# Patient Record
Sex: Male | Born: 1950
Health system: Southern US, Community
[De-identification: ages and names within clinical notes are randomized; demographics above are authoritative.]

## PROBLEM LIST (undated history)

## (undated) DIAGNOSIS — I10 Essential (primary) hypertension: Secondary | ICD-10-CM

## (undated) DIAGNOSIS — I2510 Atherosclerotic heart disease of native coronary artery without angina pectoris: Secondary | ICD-10-CM

## (undated) DIAGNOSIS — Z87891 Personal history of nicotine dependence: Secondary | ICD-10-CM

## (undated) DIAGNOSIS — E785 Hyperlipidemia, unspecified: Secondary | ICD-10-CM

## (undated) DIAGNOSIS — I739 Peripheral vascular disease, unspecified: Secondary | ICD-10-CM

## (undated) HISTORY — DX: Peripheral vascular disease, unspecified: I73.9

## (undated) HISTORY — DX: Essential (primary) hypertension: I10

## (undated) HISTORY — DX: Personal history of nicotine dependence: Z87.891

## (undated) HISTORY — DX: Atherosclerotic heart disease of native coronary artery without angina pectoris: I25.10

## (undated) HISTORY — DX: Hyperlipidemia, unspecified: E78.5

---

## 2004-08-15 HISTORY — PX: CORONARY ARTERY BYPASS GRAFT: SHX141

## 2005-02-20 ENCOUNTER — Emergency Department: Payer: Self-pay | Admitting: Emergency Medicine

## 2005-02-28 ENCOUNTER — Emergency Department: Payer: Self-pay | Admitting: Emergency Medicine

## 2005-02-28 ENCOUNTER — Other Ambulatory Visit: Payer: Self-pay

## 2005-03-04 ENCOUNTER — Ambulatory Visit: Payer: Self-pay | Admitting: Physician Assistant

## 2005-12-12 ENCOUNTER — Emergency Department: Payer: Self-pay | Admitting: Unknown Physician Specialty

## 2005-12-12 ENCOUNTER — Other Ambulatory Visit: Payer: Self-pay

## 2005-12-19 ENCOUNTER — Ambulatory Visit: Payer: Self-pay | Admitting: Cardiovascular Disease

## 2005-12-19 ENCOUNTER — Inpatient Hospital Stay (HOSPITAL_COMMUNITY): Admission: EM | Admit: 2005-12-19 | Discharge: 2005-12-24 | Payer: Self-pay | Admitting: Surgery

## 2005-12-20 ENCOUNTER — Encounter: Payer: Self-pay | Admitting: Vascular Surgery

## 2006-01-17 ENCOUNTER — Encounter: Payer: Self-pay | Admitting: Cardiovascular Disease

## 2006-01-25 ENCOUNTER — Encounter: Admission: RE | Admit: 2006-01-25 | Discharge: 2006-01-25 | Payer: Self-pay | Admitting: Surgery

## 2006-02-12 ENCOUNTER — Encounter: Payer: Self-pay | Admitting: Cardiovascular Disease

## 2006-03-15 ENCOUNTER — Encounter: Payer: Self-pay | Admitting: Cardiovascular Disease

## 2006-04-15 ENCOUNTER — Encounter: Payer: Self-pay | Admitting: Cardiovascular Disease

## 2006-05-26 ENCOUNTER — Other Ambulatory Visit: Payer: Self-pay

## 2006-05-31 ENCOUNTER — Inpatient Hospital Stay: Payer: Self-pay | Admitting: Surgery

## 2007-03-02 ENCOUNTER — Ambulatory Visit: Payer: Self-pay | Admitting: Cardiovascular Disease

## 2007-04-03 ENCOUNTER — Ambulatory Visit: Payer: Self-pay | Admitting: Vascular Surgery

## 2007-05-03 ENCOUNTER — Ambulatory Visit: Payer: Self-pay | Admitting: Vascular Surgery

## 2007-10-03 ENCOUNTER — Ambulatory Visit: Payer: Self-pay | Admitting: Vascular Surgery

## 2007-10-10 ENCOUNTER — Inpatient Hospital Stay: Payer: Self-pay | Admitting: Vascular Surgery

## 2009-01-10 ENCOUNTER — Inpatient Hospital Stay: Payer: Self-pay | Admitting: Internal Medicine

## 2009-01-13 ENCOUNTER — Ambulatory Visit: Payer: Self-pay | Admitting: Cardiovascular Disease

## 2009-03-05 IMAGING — XA IR VASCULAR PROCEDURE
15 of 24 series · 15 of 24 positions shown · non-contrast
Comparison: none

[Series 1: run · 1 of 35 slices shown (1 of 15)]
[im 1/35]
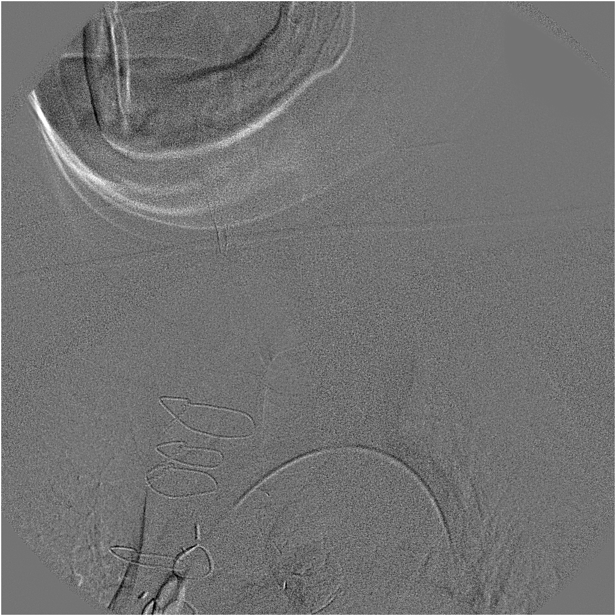

[Series 2: run · 1 of 26 slices shown (2 of 15)]
[im 1/26]
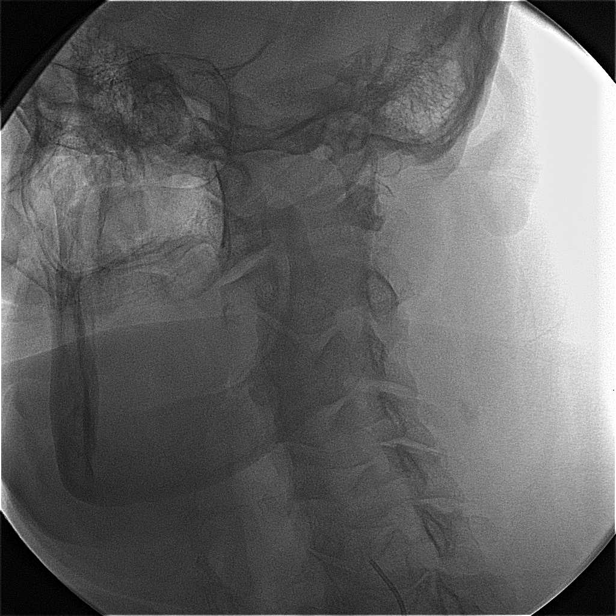

[Series 4: run · 1 of 16 slices shown (3 of 15)]
[im 1/16]
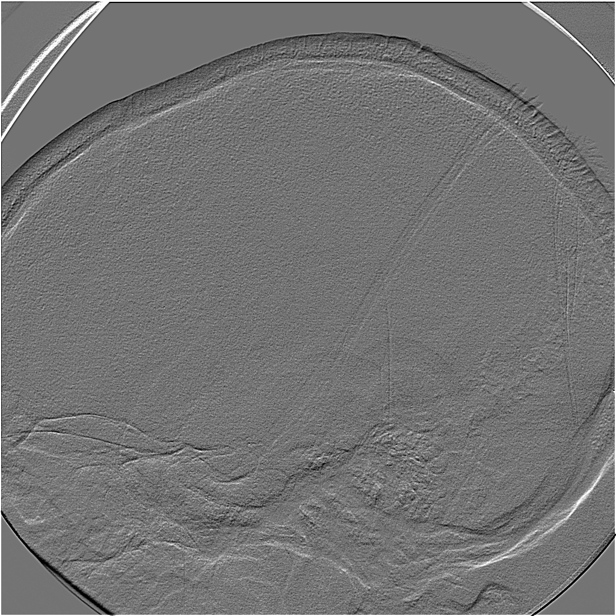

[Series 5: run · 1 of 18 slices shown (4 of 15)]
[im 1/18]
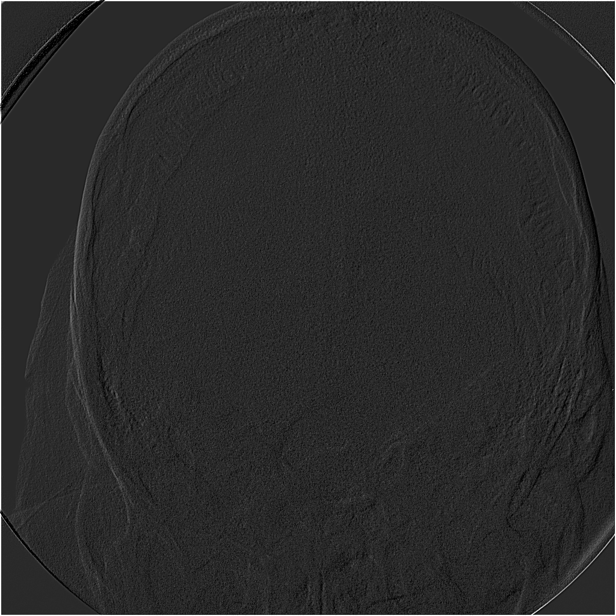

[Series 7: run · 1 of 15 slices shown (5 of 15)]
[im 1/15]
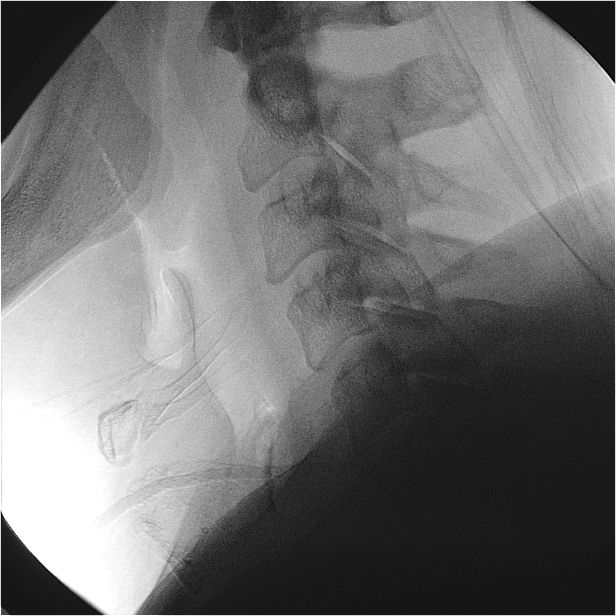

[Series 8: run · 1 of 13 slices shown (6 of 15)]
[im 1/13]
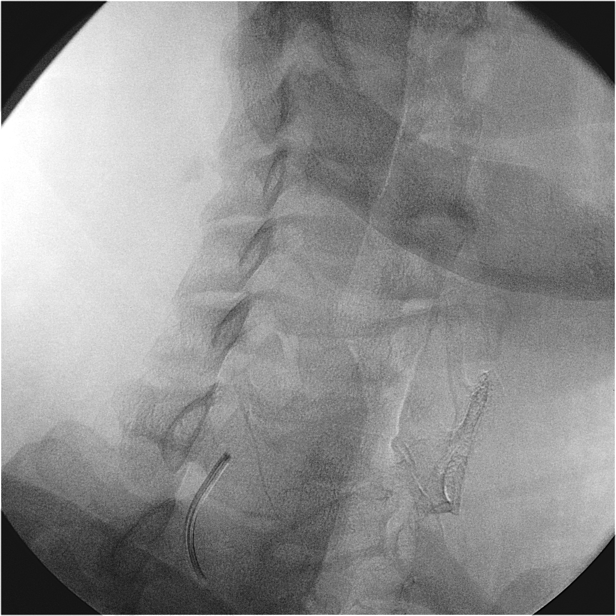

[Series 10: run · 1 of 15 slices shown (7 of 15)]
[im 1/15]
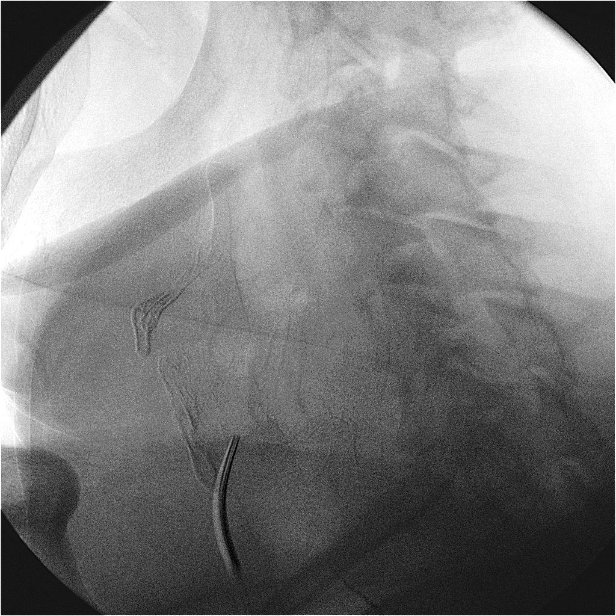

[Series 12: run · 1 of 16 slices shown (8 of 15)]
[im 1/16]
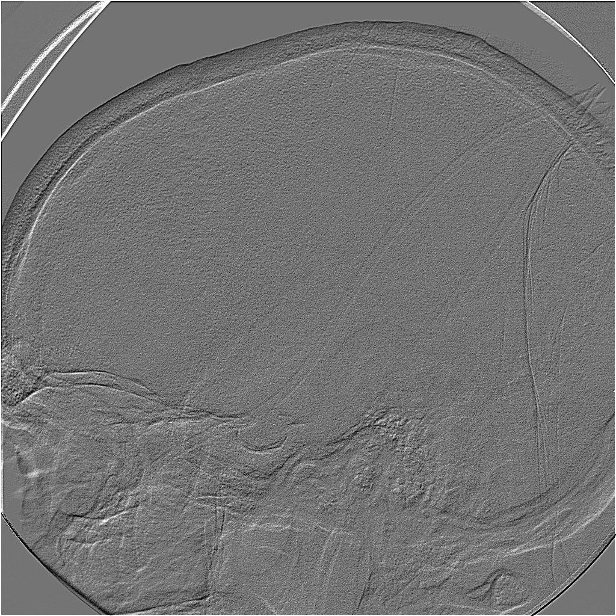

[Series 13: run · 1 of 18 slices shown (9 of 15)]
[im 1/18]
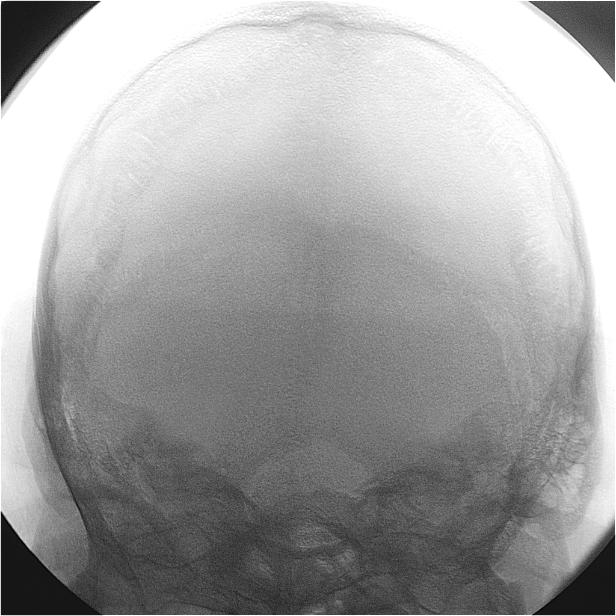

[Series 15: run · 1 of 11 slices shown (10 of 15)]
[im 1/11]
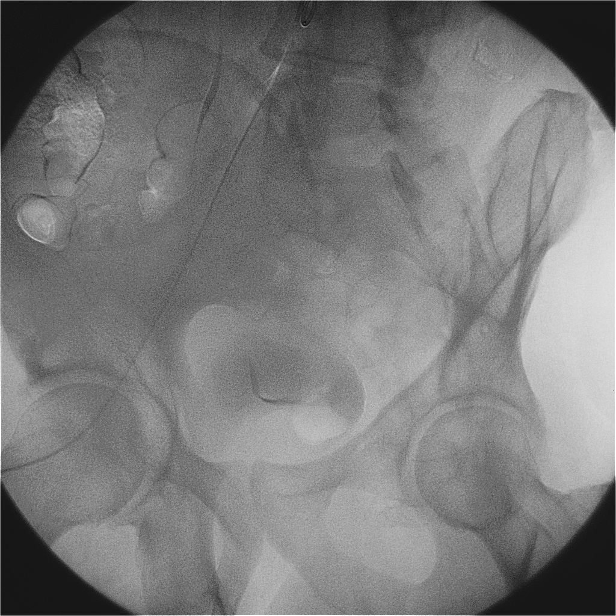

[Series 16: run · 1 of 31 slices shown (11 of 15)]
[im 1/31]
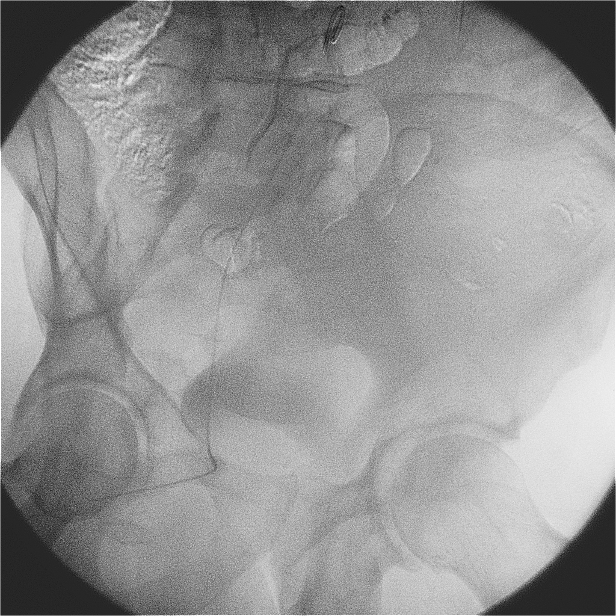

[Series 18: run · 1 of 14 slices shown (12 of 15)]
[im 1/14]
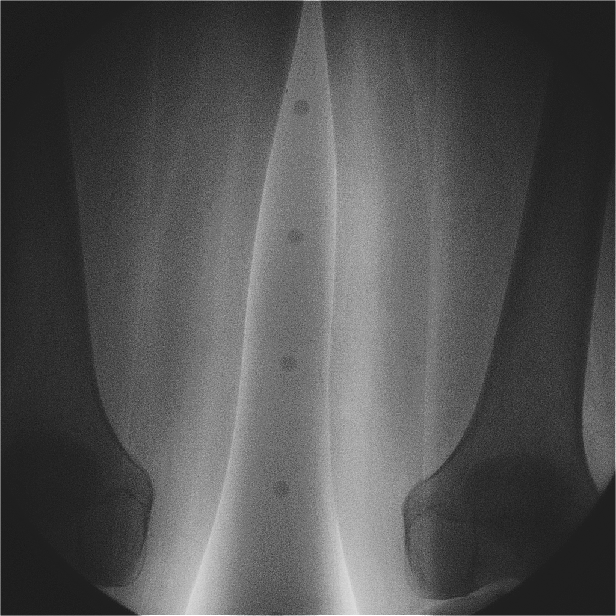

[Series 20: run · 1 of 33 slices shown (13 of 15)]
[im 1/33]
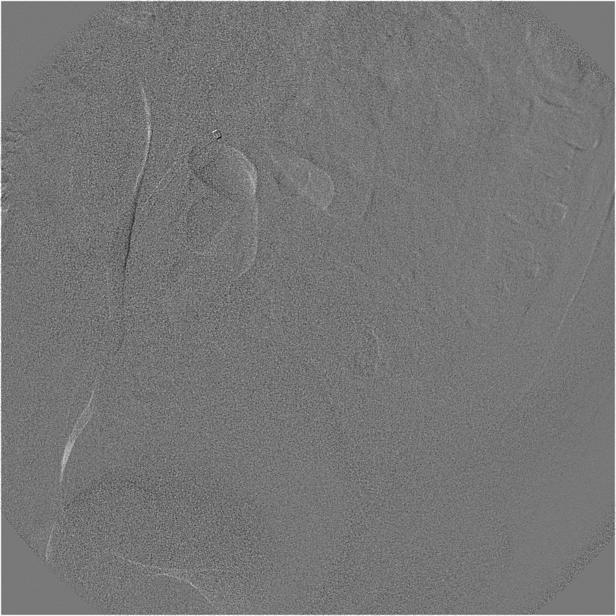

[Series 21: run · 1 of 13 slices shown (14 of 15)]
[im 1/13]
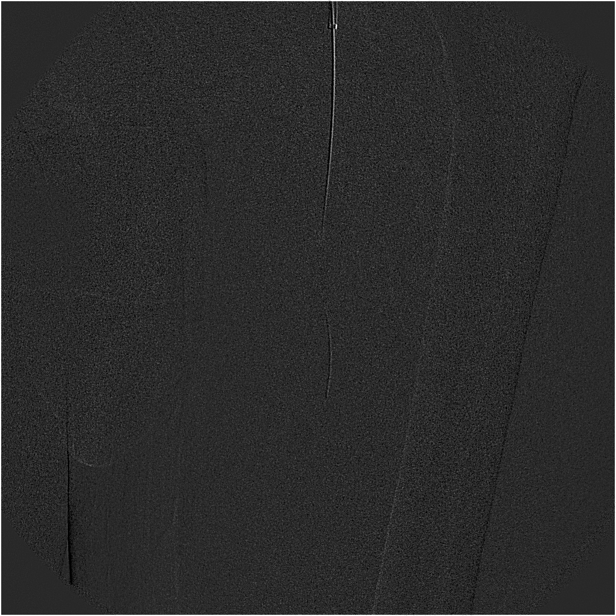

[Series 23: run · 1 of 16 slices shown (15 of 15)]
[im 1/16]
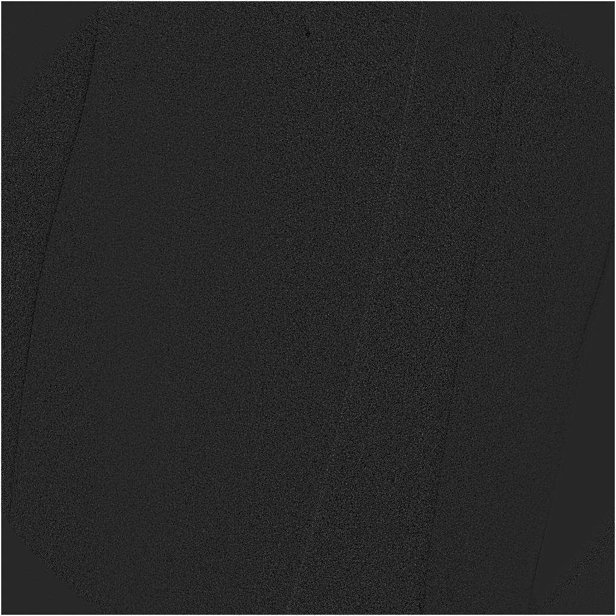

[15 of 24 positions shown; findings below may reference images not displayed]

IMAGES IMPORTED FROM THE [employer] WORKFLOW SYSTEM
NO DICTATION FOR STUDY

## 2009-05-12 ENCOUNTER — Ambulatory Visit: Payer: Self-pay | Admitting: Vascular Surgery

## 2009-12-08 ENCOUNTER — Inpatient Hospital Stay: Payer: Self-pay | Admitting: Internal Medicine

## 2009-12-14 ENCOUNTER — Ambulatory Visit: Payer: Self-pay | Admitting: Unknown Physician Specialty

## 2010-09-05 ENCOUNTER — Encounter: Payer: Self-pay | Admitting: Surgery

## 2010-09-18 ENCOUNTER — Emergency Department: Payer: Self-pay | Admitting: Emergency Medicine

## 2010-09-27 ENCOUNTER — Ambulatory Visit: Payer: Self-pay | Admitting: Internal Medicine

## 2010-10-20 ENCOUNTER — Other Ambulatory Visit: Payer: Self-pay | Admitting: Gastroenterology

## 2010-10-28 ENCOUNTER — Ambulatory Visit: Payer: Self-pay | Admitting: Surgery

## 2010-11-03 LAB — PATHOLOGY REPORT
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
## 2010-12-31 NOTE — Op Note (Signed)
NAMEMarland Hernandez  EGON, DITTUS NO.:  [id number]   MEDICAL RECORD NO.:  [id number]          PATIENT TYPE:  INP   LOCATION:  2301                         FACILITY:  [facility]   PHYSICIAN:  Evelene Croon, M.D.     DATE OF BIRTH:  1950-10-26   DATE OF PROCEDURE:  12/20/2005  DATE OF DISCHARGE:                                 OPERATIVE REPORT   PREOPERATIVE DIAGNOSIS:  Severe three-vessel coronary artery disease.   POSTOPERATIVE DIAGNOSIS:  Severe three-vessel coronary artery disease.   OPERATIVE PROCEDURE:  1.  Median sternotomy.  2.  Extracorporeal circulation.  3.  Coronary artery bypass graft surgery x4 using a left internal mammary      artery graft to the left anterior descending coronary artery, with a      saphenous vein graft to the diagonal branch of the left anterior      descending, a saphenous vein graft to the obtuse marginal branch of the      left circumflex coronary artery, and a saphenous vein graft to the      posterior descending branch of the right coronary artery.  4.  Endoscopic vein harvesting from the right leg.   ATTENDING SURGEON:  Evelene Croon, M.D.   ASSISTANT:  Rowe Clack, P.A.-C.   ANESTHESIA:  General endotracheal.   CLINICAL HISTORY:  This patient is a 60 year old gentleman with a history of  smoking, hypertension, and hypercholesterolemia, as well as a positive  family history of heart disease, who was transferred here by Dr. Welton Flakes from  [facility] for coronary artery bypass surgery.  He  presented with a several-week history of episodic dizziness and generalized  weakness.  He underwent a stress test which suggested anterior ischemia.  Cardiac catheterization yesterday at [facility] showed  severe three-vessel disease.  There was 90% proximal LAD stenosis and 70%  diagonal stenosis.  There is about 70% mid left circumflex stenosis with a  large marginal branch.  There is 90% proximal right  coronary artery stenosis  with some thrombus present.  Left ventricular ejection fraction was normal.  After review of the angiogram and examination of the patient, it was felt  that coronary artery bypass graft surgery was the best treatment to prevent  further ischemia and infarction.  I discussed the operative procedure with  the patient and his wife including alternatives, benefits, and risks  including bleeding, infection, stroke, myocardial infarction, graft failure,  and death.  The patient did inform me that his wife was a TEFL teacher Witness.  Although he was not formally a Jehovah's Witness, he still wished to honor  her religion and therefore did not want any blood product transfusion during  the procedure.  His preoperative hemoglobin was greater than 14 and  therefore I felt it would be safe to proceed with coronary artery bypass  surgery.   OPERATIVE PROCEDURE:  The patient was taken to the operating room and placed  on the table in a supine position.  After induction of general endotracheal  anesthesia, a Foley catheter was placed in  the bladder using sterile  technique.  Then the chest, abdomen and both lower extremities were prepped  and draped in the usual sterile manner.  The chest was entered through a  median sternotomy incision and the pericardium opened in the midline.  Examination of the heart showed good ventricular contractility.  The  ascending aorta had no palpable plaques in it.   Then the left internal mammary artery was harvested from the chest wall as a  pedicle graft.  This was a medium-caliber vessel with excellent blood flow  through it.  At the same time, a segment of greater saphenous vein was  harvested from the right leg using endoscopic vein harvest technique.  This  vein was of medium size and good quality.   Then the patient was heparinized and when an adequate activated clotting  time was achieved, the distal ascending aorta was cannulated using  a 20-  [place] aortic cannula for arterial inflow.  Venous outflow was achieved  using a 2-stage venous cannula for the right atrial appendage.  An antegrade  cardioplegia and vent cannula was inserted in the aortic root.   The patient was placed on cardiopulmonary bypass and the distal coronaries  were identified.  The LAD was a large graftable vessel.  It did have diffuse  posterior plaque present.  The posterior plaque extended out into the apical  portion of the vessel.  The diagonal branch was a medium-sized graftable  vessel.  The obtuse marginal was intramyocardial in its proximal portion and  exited through the surface of the heart distally, where it was a large  graftable vessel.  There was no visible disease distally.  The distal left  circumflex terminated as a small marginal branch.  The right coronary artery  was diffusely diseased with calcific plaque and this extended out to the  takeoff of the posterior descending branch.  The posterior descending branch  itself was a medium-sized vessel that had no distal disease in it.  The  cardiac catheterization had shown this 90% stenosis in the proximal-to-mid  right coronary artery with some thrombus present.  There was also some  stenosis present in the proximal portion of the posterior descending branch.   Then the aorta was cross-clamped and 1000 mL of cold blood antegrade  cardioplegia was administered in the aortic root with quick arrest of the  heart.  Systemic hypothermia to 28 degrees centigrade and topical  hypothermia with iced saline was used.  A temperature probe was placed in  the septum and an insulating pad in the pericardium.   The first distal anastomosis was performed to the obtuse marginal branch.  The internal diameter was 1.75 mm.  The conduit that was used was a segment  of greater saphenous vein and the anastomosis performed in an end-to-side manner using continuous 7-0 Prolene suture.  Flow was measured  through the  graft and was excellent.   The second distal anastomosis was performed to the posterior descending  coronary artery.  The internal diameter was about 1.75 mm.  The conduit that  was used was a second segment of greater saphenous vein and the anastomosis  performed in an end-to-side manner using continuous 7-0 Prolene suture.  Flow was noted through the graft and was excellent.   The third distal anastomosis was performed to the diagonal branch.  The  internal diameter of this vessel was about 1.6 mm.  The conduit that was  used was a third segment of greater saphenous vein  and the anastomosis  performed in an end-to-side manner using continuous 7-0 Prolene suture.  Flow was measured through the graft and was excellent.  Then another dose of  cardioplegia was given down the vein grafts and the aortic root.   The fourth distal anastomosis was performed to the distal LAD.  The internal  diameter was about 2 mm.  The conduit that was used was the left internal  mammary graft and this was brought through an opening in the left  pericardium anterior to the phrenic nerve.  It was anastomosed to the LAD in  an end-to-side manner using continuous 8-0 Prolene suture.  The pedicle was  sutured to the epicardium with 6-0 Prolene sutures.  The patient was  rewarmed to 37 degrees centigrade.  With the cross-clamp in place, the 3  proximal vein graft anastomoses were performed to the aortic root in an end-  to-side manner using continuous 6-0 Prolene suture.  Then the clamp was  removed from the mammary artery pedicle.  There was rapid rewarming of the  ventricular septum and return of spontaneous ventricular fibrillation.  The  cross-clamp was removed with a time of 77 minutes and the patient  spontaneously converted to sinus rhythm.   The proximal and distal anastomoses appeared hemostatic and the lines of the  graft satisfactory.  A graft marker was placed around the proximal   anastomoses.  Two temporary right ventricular and right atrial pacing wires  were placed and brought out through the skin.   When the patient had rewarmed to 37 degrees centigrade, he was weaned from  cardiopulmonary bypass on no inotropic agents.  Total bypass time was 91  minutes.  Cardiac function appeared excellent with a cardiac output of 6  L/min.  Protamine was given and the venous and aortic cannulas were removed  without difficulty.  Hemostasis was achieved.  Three chest tubes were placed  with a tube in the posterior pericardium, 1 in the left pleural space and 1  in the anterior mediastinum.  The pericardium was loosely reapproximated  over the heart.  The sternum was closed with #6 stainless steel wires.  The  fascia was closed with a continuous #1 Vicryl.  The subcutaneous tissue was  closed with a continuous 2-0 Vicryl and the skin with a 3-0 Vicryl subcuticular closure.  The lower extremity vein harvest site was closed in  layers in a similar manner.  The sponge, needle and instrument counts were  correct according to the scrub nurse.  Dry sterile dressings were applied  over the incisions and around the chest tubes, which were hooked to Pleur-  evac suction.  The patient remained hemodynamically stable and was  transported to the SICU in guarded, but stable condition.      Evelene Croon, M.D.  Electronically Signed     BB/MEDQ  D:  12/20/2005  T:  12/21/2005  Job:  [id number]   cc:   Nicholes Rough, [place] Adrian Blackwater MD

## 2010-12-31 NOTE — Discharge Summary (Signed)
NAMEMarland Kitchen  Johnathan Hernandez, Johnathan Hernandez NO.:  [id number]   MEDICAL RECORD NO.:  [id number]          PATIENT TYPE:  INP   LOCATION:  2031                         FACILITY:  [facility]   PHYSICIAN:  Evelene Croon, M.D.     DATE OF BIRTH:  1951-07-18   DATE OF ADMISSION:  12/19/2005  DATE OF DISCHARGE:                                 DISCHARGE SUMMARY   PRIMARY DIAGNOSIS:  Severe three vessel coronary artery disease.   SECONDARY DIAGNOSES:  1.  Hyperlipidemia.  2.  Hypertension.   ALLERGIES:  Allergic to PERCOCET and VICODIN.   OPERATIONS AND PROCEDURES:  1.  Coronary artery bypass grafting x4 using a left internal mammary artery      graft to the left anterior descending coronary artery, saphenous vein      graft to diagonal branch of the left anterior descending, saphenous vein      graft to obtuse marginal branch of the left circumflex coronary artery,      saphenous vein graft to the posterior descending branch of the RCA.      Endoscopic vein harvesting from the right leg was done.   HOSPITAL COURSE:  The patient is a 60 year old gentleman with a history of  tobacco use, hypertension, hyperlipidemia as well as positive family history  of heart disease.  The patient was transferred to [facility] by Dr. Welton Flakes  from [facility] for coronary artery bypass grafting.  The patient presented with a several week history of episodic dizziness and  generalized weakness.  He underwent stress test which suggested the anterior  anemia.  Cardiac catheterization was then performed [place] 7, 2007, which  showed a 90% proximal LAD stenosis and 70% diagonal stenosis.  There is  about 70% mid left circumflex of the large marginal branch.  There is 90%  proximal RCA stenosis with some thrombus present.  Left ventricular ejection  fraction was normal.  Films were evaluated and was reviewed by Dr. Laneta Simmers.  Dr. Laneta Simmers discussed with the patient undergoing coronary bypass grafting.  He  discussed risks and benefits of the procedure.  The patient acknowledged  understanding and agreed to proceed.  The surgery was scheduled for [place] 8,  2007.  Prior to undergoing the surgery, the patient underwent preoperative  bilateral carotid duplex ultrasound which showed the right to have no ICA  stenosis and the left to have 60-80% ICA stenosis.   The patient was taken to the operating room and [place] 8, 2007, where he  underwent coronary artery bypass grafting x4 using a left internal mammary  artery graft to the left anterior descending coronary artery, saphenous vein  graft to the diagonal branch of the left anterior descending, saphenous vein  graft to the obtuse marginal branch of the left circumflex, saphenous vein  graft to posterior descending branch of the RCA.  Endoscopic vein harvesting  from the right leg was done.  The patient tolerated this procedure well and  was transferred up to the intensive care unit in stable condition.  Immediately following surgery, the patient was seen to be  hemodynamically  stable.  The patient was extubated in the early morning following surgery.  Following extubation, the patient was seen to be alert and oriented x 3  neurologically intact.  The patient's postoperative course was pretty much  unremarkable.  Postop day one, chest tubes and lines were discontinued in  the normal fashion.  He was seen to remain hemodynamically stable with a  hemoglobin and hematocrit at 12.1 and 35.5.  Creatinine was stable at 0.8.  The patient was out of bed ambulating well.  He was transferred out to 2000  on postop day one.  The next day, the patient did have complaints of left  shoulder and upper arm pain as well as right arm pain.  He noted numbness in  the left ulnar nerve distribution with pain and weakness in his left arm.  Questionable brachial plexus injury versus cervical nerve root.  This was  monitored during his hospital stay.  This did improve slightly  prior to  discharge.  The patient's vitals were monitored postoperatively and were  seen to be stable.  Medications were adjusted.  The patient was able to be  weaned off oxygen sating greater than 90% on room air.  The patient remained  in normal sinus rhythm postoperatively.  Chest x-rays were stable.  The  incisions were dry and intact and healing well.  The patient was out of bed  ambulating well.  He was tolerating a regular diet well with no nausea,  vomiting noted.  Bowel movements within normal limits.   The patient is tentatively ready for discharge home over the next 1-2 days  if he remains stable.  A follow-up appointment will be scheduled with Dr.  Laneta Simmers for in three weeks.  The patient will need to obtain a PA and lateral  chest x-ray one hour prior to this appointment.  The patient will need to  contact Dr. Milta Deiters office in [place] to schedule a follow-up appointment  with him in two weeks.  Johnathan Hernandez received instructions on diet, activity  level, and incisional care.  He was told no driving until released to do so,  no heavy lifting over 10 pounds.  The patient is told he is allowed to  shower washing his incisions using soap and water.  He is to contact the  office if he develops any drainage or opening from any of his incision  sites.  The patient is to ambulate 3-4 times per day and to progress as  tolerated and to continue his breathing exercises.  The patient was educated  on diet to be low-fat, low-salt.  He acknowledged understanding.   DISCHARGE MEDICATIONS:  1.  Aspirin 325 mg daily.  2.  Lopressor 25 mg b.i.d.  3.  Lipitor 40 mg at night.  4.  Prilosec 20 mg daily.  5.  Ultram 50 mg 1-2 tablets q.4-6h. p.r.n. pain.      Johnathan Hernandez, [place]      Evelene Croon, M.D.  Electronically Signed    KMD/MEDQ  D:  12/23/2005  T:  12/24/2005  Job:  [id number]

## 2011-05-20 ENCOUNTER — Ambulatory Visit: Payer: Self-pay | Admitting: Unknown Physician Specialty

## 2011-05-20 DIAGNOSIS — I10 Essential (primary) hypertension: Secondary | ICD-10-CM

## 2011-05-26 ENCOUNTER — Inpatient Hospital Stay: Payer: Self-pay | Admitting: Unknown Physician Specialty

## 2011-07-16 DIAGNOSIS — I739 Peripheral vascular disease, unspecified: Secondary | ICD-10-CM

## 2011-07-16 HISTORY — DX: Peripheral vascular disease, unspecified: I73.9

## 2011-07-16 HISTORY — PX: AORTA - ILIAC ARTERY BYPASS GRAFT: SUR174

## 2011-09-26 ENCOUNTER — Telehealth: Payer: Self-pay | Admitting: Internal Medicine

## 2011-09-26 NOTE — Telephone Encounter (Signed)
[phone] Pt wife came in and stated that he thinks he has a uti.  He is unable to control voiding. Has fever, nausated back pain. Pt has appointment Wednesday with Dr Bernette Redbird @ [facility]  Has new pt appointment 11/14/11.  Pt wife stated he does not have primary care dr and wanted to know if he could be seen sooner

## 2011-09-26 NOTE — Telephone Encounter (Signed)
See below note Appointment has to be after 11 Pt has another appointment on wed 2/13 @ 10:45  \ Can not come at all on Friday 2/15, Or the  2/20,2/25,2/28 unless in afternoon

## 2011-09-27 NOTE — Telephone Encounter (Signed)
Patient advised that we have no sooner openings especially for times that he has requested. He was notified to see an urgent care for any acute problems that he Maker have until he is established with [place]. Patient agreed.

## 2011-11-14 ENCOUNTER — Ambulatory Visit (INDEPENDENT_AMBULATORY_CARE_PROVIDER_SITE_OTHER): Payer: PRIVATE HEALTH INSURANCE | Admitting: Internal Medicine

## 2011-11-14 ENCOUNTER — Encounter: Payer: Self-pay | Admitting: Internal Medicine

## 2011-11-14 VITALS — BP 132/66 | HR 80 | Temp 97.9°F | Resp 16 | Ht 71.0 in | Wt 207.5 lb

## 2011-11-14 DIAGNOSIS — I798 Other disorders of arteries, arterioles and capillaries in diseases classified elsewhere: Secondary | ICD-10-CM

## 2011-11-14 DIAGNOSIS — I70209 Unspecified atherosclerosis of native arteries of extremities, unspecified extremity: Secondary | ICD-10-CM

## 2011-11-14 DIAGNOSIS — E119 Type 2 diabetes mellitus without complications: Secondary | ICD-10-CM

## 2011-11-14 DIAGNOSIS — Z87891 Personal history of nicotine dependence: Secondary | ICD-10-CM | POA: Insufficient documentation

## 2011-11-14 DIAGNOSIS — E785 Hyperlipidemia, unspecified: Secondary | ICD-10-CM

## 2011-11-14 DIAGNOSIS — Z79899 Other long term (current) drug therapy: Secondary | ICD-10-CM

## 2011-11-14 DIAGNOSIS — I2510 Atherosclerotic heart disease of native coronary artery without angina pectoris: Secondary | ICD-10-CM

## 2011-11-14 DIAGNOSIS — I739 Peripheral vascular disease, unspecified: Secondary | ICD-10-CM | POA: Insufficient documentation

## 2011-11-14 DIAGNOSIS — Z125 Encounter for screening for malignant neoplasm of prostate: Secondary | ICD-10-CM

## 2011-11-14 DIAGNOSIS — E1159 Type 2 diabetes mellitus with other circulatory complications: Secondary | ICD-10-CM

## 2011-11-14 MED ORDER — METFORMIN HCL ER 500 MG PO TB24: 500.0000 mg | ORAL_TABLET | Freq: Every day | ORAL | Status: DC

## 2011-11-14 MED ORDER — METOPROLOL SUCCINATE ER 100 MG PO TB24: 100.0000 mg | ORAL_TABLET | Freq: Every day | ORAL | Status: DC

## 2011-11-14 MED ORDER — PRAVASTATIN SODIUM 10 MG PO TABS: 10.0000 mg | ORAL_TABLET | Freq: Every day | ORAL | Status: DC

## 2011-11-14 NOTE — Patient Instructions (Signed)
Stop the nortriptyline and increase the tramadol to 2 at bedtime as a trila for your nighttime pain .   Labs today to check on diabetes, cholesterol and prostate cancer screening.   Return in 3 months for your annual physical.

## 2011-11-14 NOTE — Assessment & Plan Note (Addendum)
hgba1c is 7.5 without proteinuria and nwith normal renal function.  Increasing metformin to 1000 mg daily

## 2011-11-14 NOTE — Assessment & Plan Note (Signed)
Asymptomatic currently and historically. Continue current medications.

## 2011-11-14 NOTE — Progress Notes (Signed)
Patient ID: Johnathan Hernandez, male   DOB: Mar 05, 1951, 61 y.o.   MRN: [id number]  Patient Active Problem List  Diagnoses  . Peripheral vascular disease  . History of tobacco abuse  . Coronary artery disease  . 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 New Patient    HPI:   Johnathan Hernandez a 61 y.o. male who presents Who is tansferring from Dr. Alison Murray.  He as last seen 2 months ago but did not have labs doe for follow up on diabetes mellitus and hyperlipidemia.     Cc left foot pain , persistent not responding to epidural injections done recently at [facility].   ,  Lyrica started which helped but not for the last couple of weeks.  Has history of hallucinations and apnea with vicodin and percocet, respectively.,  Oxycontin helped but it was stopped when the shots were started. He has a history of lumbar surgery Oct 2012 for Sciatica has not helped and surgeon Dr. Raford Pitcher has now released him.  He is being referred by [facility] Comp amy Finiss, caseworker,  For a second opinion by a neurosurgeon Next Monday.       Past Medical History  Diagnosis Date  . Diabetes mellitus   . Hypertension   . Hyperlipidemia   . Peripheral vascular disease Dec 2012    [facility], treated with extensive arterial bypass  . History of tobacco abuse     quit in 2006 after 5 vessel cabg  . Coronary artery disease     Past Surgical History  Procedure Date  . Coronary artery bypass graft 2006    at [facility]         The following portions of the patient's history were reviewed and updated as appropriate: Allergies, current medications, and problem list.    Review of Systems:   12 Pt  review of systems was negative except those addressed in the HPI,     History   Social History  . Marital Status: Married    Spouse Name: N/A    Number of Children: N/A  . Years of Education: N/A   Occupational History  .  Not on file.   Social History Main Topics  . Smoking status: Former Smoker    Quit date: 11/13/2004  . Smokeless tobacco: Never Used  . Alcohol Use: No  . Drug Use: No  . Sexually Active: Not on file   Other Topics Concern  . Not on file   Social History Narrative  . No narrative on file    Objective:  BP 132/66  Pulse 80  Temp(Src) 97.9 F (36.6 C) (Oral)  Resp 16  Ht 5\' 11"  (1.803 m)  Wt 207 lb 8 oz (94.121 kg)  BMI 28.94 kg/m2  SpO2 96%  General appearance: alert, cooperative and appears stated age Ears: normal TM's and external ear canals both ears Throat: lips, mucosa, and tongue normal; teeth and gums normal Neck: no adenopathy, no carotid bruit, supple, symmetrical, trachea midline and thyroid not enlarged, symmetric, no tenderness/mass/nodules Back: symmetric, no curvature. ROM normal. No CVA tenderness. Lungs: clear to auscultation bilaterally Heart: regular rate and rhythm, S1, S2 normal, no murmur, click, rub or gallop Abdomen: soft, non-tender; bowel sounds normal; no masses,  no organomegaly Pulses: 2+ and symmetric Skin: Skin color, texture, turgor normal. No rashes or lesions Lymph nodes: Cervical, supraclavicular, and axillary nodes normal.  Assessment and Plan:  Peripheral vascular disease S/p aortoiliac bypass done at [facility] several years ago, with restoration of circulation but  Persistent toe pain.  His foot exam today shows well perfused toes. 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  He takes a statin,  LDl goal is 70.  Records requested and repeat labs due to assess control and enal function.  Diabetic shoes requested for management of chronic foot pain, which is worth a try.  Reminder for annual eye exam given.   Coronary artery disease Asymptomatic currently and historically. Continue current medications.     Updated Medication List Outpatient  Encounter Prescriptions as of 11/14/2011  Medication Sig Dispense Refill  . aspirin 81 MG tablet Take 81 mg by mouth daily.      . Cholecalciferol (VITAMIN D3) 2000 UNITS TABS Take by mouth.      Tery Sanfilippo Calcium (STOOL SOFTENER PO) Take by mouth.      . fish oil-omega-3 fatty acids 1000 MG capsule Take 2 g by mouth daily.      . metaxalone (SKELAXIN) 800 MG tablet Take 800 mg by mouth 3 (three) times daily.      . metFORMIN (GLUCOPHAGE-XR) 500 MG 24 hr tablet Take 1 tablet (500 mg total) by mouth daily with breakfast.  90 tablet  3  . metoprolol succinate (TOPROL-XL) 100 MG 24 hr tablet Take 1 tablet (100 mg total) by mouth daily. Take with or immediately following a meal.  90 tablet  3  . Multiple Vitamin (MULTIVITAMIN) tablet Take 1 tablet by mouth daily.      . nortriptyline (PAMELOR) 25 MG capsule Take 25 mg by mouth at bedtime.      [name] omeprazole (PRILOSEC) 20 MG capsule Take 20 mg by mouth daily.      . pravastatin (PRAVACHOL) 10 MG tablet Take 1 tablet (10 mg total) by mouth daily.  90 tablet  3  . pregabalin (LYRICA) 75 MG capsule Take 75 mg by mouth 2 (two) times daily.      . traMADol (ULTRAM) 50 MG tablet Take 50 mg by mouth every 6 (six) hours as needed.      . vitamin B-12 (CYANOCOBALAMIN) 1000 MCG tablet Take 1,000 mcg by mouth daily.      [name] DISCONTD: metFORMIN (GLUCOPHAGE-XR) 500 MG 24 hr tablet Take 500 mg by mouth daily with breakfast.      . DISCONTD: metoprolol succinate (TOPROL-XL) 100 MG 24 hr tablet Take 100 mg by mouth daily. Take with or immediately following a meal.      . DISCONTD: pravastatin (PRAVACHOL) 10 MG tablet Take 10 mg by mouth daily.         Orders Placed This Encounter  Procedures  . TSH  . COMPLETE METABOLIC PANEL WITH GFR  . Hemoglobin A1c  . Microalbumin / creatinine urine ratio  . Direct LDL  . PSA    No Follow-up on file.

## 2011-11-14 NOTE — Assessment & Plan Note (Addendum)
S/p aortoiliac bypass done at [facility] Dec 2012  with restoration of circulation but  Persistent toe pain.  His foot exam today shows well perfused toes. He has regular follow up with vascular at [facility]

## 2011-11-15 LAB — PSA: PSA: 2.02 ng/mL (ref 0.10–4.00)

## 2011-11-15 LAB — MICROALBUMIN / CREATININE URINE RATIO
Creatinine,U: 206.7 mg/dL
Microalb Creat Ratio: 0.5 mg/g (ref 0.0–30.0)
Microalb, Ur: 1 mg/dL (ref 0.0–1.9)

## 2011-11-15 LAB — COMPLETE METABOLIC PANEL WITH GFR
ALT: 15 U/L (ref 0–53)
AST: 27 U/L (ref 0–37)
CO2: 25 mEq/L (ref 19–32)
GFR, Est African American: >89 mL/min
GFR, Est Non African American: 82 mL/min
Potassium: 4.7 mEq/L (ref 3.5–5.3)
Sodium: 141 mEq/L (ref 135–145)
Total Bilirubin: 0.3 mg/dL (ref 0.3–1.2)

## 2011-11-15 LAB — TSH: TSH: 2.5 u[IU]/mL (ref 0.35–5.50)

## 2011-11-16 DIAGNOSIS — E785 Hyperlipidemia, unspecified: Secondary | ICD-10-CM | POA: Insufficient documentation

## 2011-11-16 MED ORDER — PRAVASTATIN SODIUM 40 MG PO TABS: 40.0000 mg | ORAL_TABLET | Freq: Every day | ORAL | Status: DC

## 2011-11-16 MED ORDER — METFORMIN HCL ER (OSM) 1000 MG PO TB24: 1000.0000 mg | ORAL_TABLET | Freq: Every day | ORAL | Status: DC

## 2011-11-16 NOTE — Assessment & Plan Note (Signed)
LDL 128 on pravastatin 10 mg,.  Increasing to 40 mg .

## 2011-11-16 NOTE — Progress Notes (Signed)
Addended by: Duncan Dull on: 11/16/2011 01:09 PM   Modules accepted: Orders

## 2011-11-18 ENCOUNTER — Other Ambulatory Visit: Payer: Self-pay | Admitting: Internal Medicine

## 2011-11-18 DIAGNOSIS — E1151 Type 2 diabetes mellitus with diabetic peripheral angiopathy without gangrene: Secondary | ICD-10-CM

## 2011-11-18 MED ORDER — METFORMIN HCL ER 500 MG PO TB24: 1000.0000 mg | ORAL_TABLET | Freq: Every day | ORAL | Status: DC

## 2011-12-22 ENCOUNTER — Other Ambulatory Visit: Payer: Self-pay | Admitting: Orthopedic Surgery

## 2011-12-22 DIAGNOSIS — M533 Sacrococcygeal disorders, not elsewhere classified: Secondary | ICD-10-CM

## 2012-01-11 ENCOUNTER — Other Ambulatory Visit: Payer: PRIVATE HEALTH INSURANCE

## 2012-02-09 ENCOUNTER — Ambulatory Visit
Admission: RE | Admit: 2012-02-09 | Discharge: 2012-02-09 | Disposition: A | Discharge status: Home / Self Care | Payer: Worker's Compensation | Source: Ambulatory Visit | Attending: Orthopedic Surgery | Admitting: Orthopedic Surgery

## 2012-02-09 DIAGNOSIS — M533 Sacrococcygeal disorders, not elsewhere classified: Secondary | ICD-10-CM

## 2012-03-06 ENCOUNTER — Telehealth: Payer: Self-pay | Admitting: Internal Medicine

## 2012-03-06 NOTE — Telephone Encounter (Signed)
Caller: Tyson/Patient; PCP: Duncan Dull; CB#: [phone]; ; ; Call regarding Hypertension; 03/06/12  He said he was at pain clinic and his blood pressure was 162/90 for 2 readings   He also said he had it checked in [place] and it was in that same range.  All emergent sxs per Hypertension Diagnosed or Suspected R/O except for multiple elevated blood pressure readings without other symptoms readings exceed expected range defined by treatment plan   they have appt sched for 03-09-12  Home care advice given

## 2012-03-09 ENCOUNTER — Ambulatory Visit: Payer: Worker's Compensation | Admitting: Internal Medicine

## 2012-04-13 ENCOUNTER — Telehealth: Payer: Self-pay | Admitting: Internal Medicine

## 2012-04-13 LAB — HM DIABETES EYE EXAM: HM Diabetic Eye Exam: NORMAL

## 2012-04-13 NOTE — Telephone Encounter (Signed)
Pt came in wanting to make appointment for high bp  Made appointment for 04/30/12 with dr Darrick Huntsman Pt stated his bp is running high 7/23 6pm   139-72 7/24  5pm   134-75 7/25  3:30pm  156/81 03/08/12 6pm 126-72 7/28/ 12am 149-86 7/30 6:45am 175/99 7/31 9am 167/89 8/1  8:30am 160-71

## 2012-04-13 NOTE — Telephone Encounter (Signed)
Left message on home number asking patient to return call

## 2012-04-13 NOTE — Telephone Encounter (Signed)
He was supposed to return in July for his annual physical, or at least for a 3 month followup on his diabetes.  Please have him get a CMET, fasting lipids and hgba1c an during microalb /cr ratio a day or two prior to his visit.

## 2012-04-19 NOTE — Telephone Encounter (Signed)
Left detailed message with patients wife for him to call the office back to schedule lab appt a few days prior to appt with Dr. Darrick Huntsman.

## 2012-04-30 ENCOUNTER — Encounter: Payer: Self-pay | Admitting: Internal Medicine

## 2012-04-30 ENCOUNTER — Ambulatory Visit (INDEPENDENT_AMBULATORY_CARE_PROVIDER_SITE_OTHER): Payer: Self-pay | Admitting: Internal Medicine

## 2012-04-30 VITALS — BP 140/78 | HR 72 | Temp 98.6°F | Resp 18 | Wt 231.2 lb

## 2012-04-30 DIAGNOSIS — I10 Essential (primary) hypertension: Secondary | ICD-10-CM

## 2012-04-30 DIAGNOSIS — I70209 Unspecified atherosclerosis of native arteries of extremities, unspecified extremity: Secondary | ICD-10-CM

## 2012-04-30 DIAGNOSIS — H6120 Impacted cerumen, unspecified ear: Secondary | ICD-10-CM

## 2012-04-30 DIAGNOSIS — I739 Peripheral vascular disease, unspecified: Secondary | ICD-10-CM

## 2012-04-30 DIAGNOSIS — E1159 Type 2 diabetes mellitus with other circulatory complications: Secondary | ICD-10-CM

## 2012-04-30 DIAGNOSIS — I798 Other disorders of arteries, arterioles and capillaries in diseases classified elsewhere: Secondary | ICD-10-CM

## 2012-04-30 DIAGNOSIS — R232 Flushing: Secondary | ICD-10-CM

## 2012-04-30 DIAGNOSIS — Z79899 Other long term (current) drug therapy: Secondary | ICD-10-CM

## 2012-04-30 DIAGNOSIS — E1169 Type 2 diabetes mellitus with other specified complication: Secondary | ICD-10-CM

## 2012-04-30 DIAGNOSIS — I152 Hypertension secondary to endocrine disorders: Secondary | ICD-10-CM

## 2012-04-30 DIAGNOSIS — E1151 Type 2 diabetes mellitus with diabetic peripheral angiopathy without gangrene: Secondary | ICD-10-CM

## 2012-04-30 DIAGNOSIS — E785 Hyperlipidemia, unspecified: Secondary | ICD-10-CM

## 2012-04-30 MED ORDER — ENALAPRIL MALEATE 2.5 MG PO TABS: 2.5000 mg | ORAL_TABLET | Freq: Every day | ORAL | Status: DC

## 2012-04-30 MED ORDER — ESOMEPRAZOLE MAGNESIUM 40 MG PO CPDR: 40.0000 mg | DELAYED_RELEASE_CAPSULE | Freq: Every day | ORAL | Status: DC

## 2012-04-30 NOTE — Patient Instructions (Addendum)
I am changing your reflux medicine to nexium with some sample,.  Take it 20 minutes before breakfast  Take your claritin at night,    Resume  enalapril or blood pressure,,  Do not stop the metoprolol bc it is for your heart.    I am referring you to Dr. Jenne Campus to get your ears cleaned

## 2012-04-30 NOTE — Progress Notes (Signed)
Patient ID: Johnathan Hernandez, male   DOB: 1950-10-17, 61 y.o.   MRN: [id number]  Patient Active Problem List  Diagnosis  . Peripheral vascular disease  . History of tobacco abuse  . Coronary artery disease  . 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 Hyperlipidemia LDL goal < 70  . Cerumen impaction    Subjective:  CC:   Chief Complaint  Patient presents with  . Hypertension    HPI:   Johnathan Hernandez is a 61 y.o. male who presents 5 month follow up on chronic conditions.  He is 2 months overdue for  diabetes follow up.   His wife Johnathan Hernandez has had a decline in health and is having surgery today for vaginal cancer.  He has been having hot sweats which do not occur postprandially but are often occurring in the night. He wonders if these are being caused by the metoprolol which is taking for coronary artery disease.  2) acid reflux.  His symptoms have been more severe and persistent for the last several weeks despite using daily Prilosec. He reports frequent regurgitation of acid at night while in supine position / He denies any change in his bowel movements or unintentional weight loss.3) elevated blood pressure. He's been taking his readings at home and noticing elevations in systolic into the 150 range. He has had his ACE inhibitor stopped by a previous doctor for unclear reasons. He's only taking metoprolol currently.4) abnormal sensation in both years with loss of hearing.   Past Medical History  Diagnosis Date  . Diabetes mellitus   . Hypertension   . Hyperlipidemia   . Peripheral vascular disease Dec 2012    [facility], treated with extensive arterial bypass  . History of tobacco abuse     quit in 2006 after 5 vessel cabg  . Coronary artery disease     Past Surgical History  Procedure Date  . Coronary artery bypass graft 2006    [place]  . Aorta - iliac artery bypass graft dec 2012    [facility]         The following portions of the patient's history were  reviewed and updated as appropriate: Allergies, current medications, and problem list.    Review of Systems:   A comprehensive ROS was done and positive for reflux,    The rest was negative.    History   Social History  . Marital Status: Married    Spouse Name: N/A    Number of Children: N/A  . Years of Education: N/A   Occupational History  . Not on file.   Social History Main Topics  . Smoking status: Former Smoker    Quit date: 11/13/2004  . Smokeless tobacco: Never Used  . Alcohol Use: No  . Drug Use: No  . Sexually Active: Not on file   Other Topics Concern  . Not on file   Social History Narrative  . No narrative on file    Objective:  BP 140/78  Pulse 72  Temp 98.6 F (37 C) (Oral)  Resp 18  Wt 231 lb 4 oz (104.894 kg)  SpO2 95%  General appearance: alert, cooperative and appears stated age Ears: both TMs occluded by cerumen impaction Throat: lips, mucosa, and tongue normal; teeth and gums normal Neck: no adenopathy, no carotid bruit, supple, symmetrical, trachea midline and thyroid not enlarged, symmetric, no tenderness/mass/nodules Back: symmetric, no curvature. ROM normal. No CVA tenderness. Lungs: clear to auscultation bilaterally Heart: regular  rate and rhythm, S1, S2 normal, no murmur, click, rub or gallop Abdomen: soft, non-tender; bowel sounds normal; no masses,  no organomegaly Pulses: 2+ and symmetric Skin: Skin color, texture, turgor normal. No rashes or lesions Lymph nodes: Cervical, supraclavicular, and axillary nodes normal.  Assessment and Plan:  Peripheral vascular disease Status post aortoiliac bypass in December 2012 at [facility]. Records requested. He has no current claudication symptoms.  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 Metformin dose was increased to 1000 mg daily.  Hyperlipidemia LDL goal < 70 His LDL was 161 in April and has pravastatin dose was increased  to 40 mg. Repeat lipids have been drawn today.  Cerumen impaction He he has noticed decreased hearing and fullness in his left ear. Exam shows significant bilateral cerumen impaction. Referral to ENT for cleaning.  Hypertension associated with diabetes Will start enalapril 2.5 mg daily. He will return in one week for fast for basic metabolic panel to ensure no change in creatinine given his significant peripheral vascular disease.  Vasomotor flushing Recent onset.  Checking urine for 5 HIAA levels and consideration of carcinoid syndrome as possible etiology. However this is less likely given his absence of recurrent postprandial dumping and diaphoresis.   Updated Medication List Outpatient Encounter Prescriptions as of 04/30/2012  Medication Sig Dispense Refill  . aspirin 81 MG tablet Take 81 mg by mouth daily.      . Cholecalciferol (VITAMIN D3) 2000 UNITS TABS Take by mouth.      Johnathan Hernandez Calcium (STOOL SOFTENER PO) Take by mouth.      . fish oil-omega-3 fatty acids 1000 MG capsule Take 2 g by mouth daily.      . metaxalone (SKELAXIN) 800 MG tablet Take 800 mg by mouth 3 (three) times daily.      . metFORMIN (GLUCOPHAGE-XR) 500 MG 24 hr tablet Take 2 tablets (1,000 mg total) by mouth daily with breakfast.  60 tablet  11  . metoprolol succinate (TOPROL-XL) 100 MG 24 hr tablet Take 1 tablet (100 mg total) by mouth daily. Take with or immediately following a meal.  90 tablet  3  . Multiple Vitamin (MULTIVITAMIN) tablet Take 1 tablet by mouth daily.      . nortriptyline (PAMELOR) 25 MG capsule Take 25 mg by mouth at bedtime.      [name] omeprazole (PRILOSEC) 20 MG capsule Take 20 mg by mouth daily.      . pravastatin (PRAVACHOL) 40 MG tablet Take 1 tablet (40 mg total) by mouth daily.  90 tablet  3  . pregabalin (LYRICA) 75 MG capsule Take 75 mg by mouth 2 (two) times daily.      . traMADol (ULTRAM) 50 MG tablet Take 50 mg by mouth every 6 (six) hours as needed.      . vitamin B-12  (CYANOCOBALAMIN) 1000 MCG tablet Take 1,000 mcg by mouth daily.      . enalapril (VASOTEC) 2.5 MG tablet Take 1 tablet (2.5 mg total) by mouth daily.  90 tablet  3  . esomeprazole (NEXIUM) 40 MG capsule Take 1 capsule (40 mg total) by mouth daily.  30 capsule  3

## 2012-04-30 NOTE — Assessment & Plan Note (Addendum)
Last hemoglobin A1c was 7.5 in April. Metformin dose was increased to 1000 mg daily.

## 2012-04-30 NOTE — Assessment & Plan Note (Signed)
He he has noticed decreased hearing and fullness in his left ear. Exam shows significant bilateral cerumen impaction. Referral to ENT for cleaning.

## 2012-04-30 NOTE — Assessment & Plan Note (Signed)
Recent onset.  Checking urine for 5 HIAA levels and consideration of carcinoid syndrome as possible etiology. However this is less likely given his absence of recurrent postprandial dumping and diaphoresis.

## 2012-04-30 NOTE — Assessment & Plan Note (Signed)
Will start enalapril 2.5 mg daily. He will return in one week for fast for basic metabolic panel to ensure no change in creatinine given his significant peripheral vascular disease.

## 2012-04-30 NOTE — Assessment & Plan Note (Signed)
Status post aortoiliac bypass in December 2012 at [facility]. Records requested. He has no current claudication symptoms.

## 2012-04-30 NOTE — Assessment & Plan Note (Signed)
His LDL was 129 in April and has pravastatin dose was increased to 40 mg. Repeat lipids have been drawn today.

## 2012-05-01 LAB — LIPID PANEL
Cholesterol: 163 mg/dL (ref 0–200)
LDL Cholesterol: 100 mg/dL — ABNORMAL HIGH (ref 0–99)
Total CHOL/HDL Ratio: 4
VLDL: 19.2 mg/dL (ref 0.0–40.0)

## 2012-05-01 LAB — COMPREHENSIVE METABOLIC PANEL
ALT: 38 U/L (ref 0–53)
AST: 30 U/L (ref 0–37)
Albumin: 3.9 g/dL (ref 3.5–5.2)
Alkaline Phosphatase: 32 U/L — ABNORMAL LOW (ref 39–117)
Calcium: 8.8 mg/dL (ref 8.4–10.5)
Potassium: 4.1 mEq/L (ref 3.5–5.1)

## 2012-05-01 LAB — MICROALBUMIN / CREATININE URINE RATIO
Creatinine,U: 182 mg/dL
Microalb Creat Ratio: 1.4 mg/g (ref 0.0–30.0)
Microalb, Ur: 2.5 mg/dL — ABNORMAL HIGH (ref 0.0–1.9)

## 2012-05-05 NOTE — Addendum Note (Signed)
Addended by: Duncan Dull on: 05/05/2012 08:35 AM   Modules accepted: Orders

## 2012-05-07 NOTE — Addendum Note (Signed)
Addended by: Mauri Reading on: 05/07/2012 04:39 PM   Modules accepted: Orders

## 2012-05-11 ENCOUNTER — Other Ambulatory Visit (INDEPENDENT_AMBULATORY_CARE_PROVIDER_SITE_OTHER): Payer: PRIVATE HEALTH INSURANCE

## 2012-05-11 DIAGNOSIS — E1159 Type 2 diabetes mellitus with other circulatory complications: Secondary | ICD-10-CM

## 2012-05-11 DIAGNOSIS — E1151 Type 2 diabetes mellitus with diabetic peripheral angiopathy without gangrene: Secondary | ICD-10-CM

## 2012-05-11 DIAGNOSIS — Z79899 Other long term (current) drug therapy: Secondary | ICD-10-CM

## 2012-05-11 DIAGNOSIS — I70209 Unspecified atherosclerosis of native arteries of extremities, unspecified extremity: Secondary | ICD-10-CM

## 2012-05-11 DIAGNOSIS — I798 Other disorders of arteries, arterioles and capillaries in diseases classified elsewhere: Secondary | ICD-10-CM

## 2012-05-11 LAB — BASIC METABOLIC PANEL
CO2: 26 mEq/L (ref 19–32)
Calcium: 9.4 mg/dL (ref 8.4–10.5)
Creatinine, Ser: 1 mg/dL (ref 0.4–1.5)
GFR: 82.14 mL/min (ref >60.00)
Sodium: 137 mEq/L (ref 135–145)

## 2012-05-11 LAB — HEMOGLOBIN A1C: Hgb A1c MFr Bld: 9.4 % — ABNORMAL HIGH (ref 4.6–6.5)

## 2012-05-11 LAB — 5 HIAA, QUANTITATIVE, URINE, 24 HOUR: 5-HIAA, 24 Hr Urine: 1 mg/24 h (ref <=6.0)

## 2012-05-14 MED ORDER — GLIPIZIDE 5 MG PO TABS: 10.0000 mg | ORAL_TABLET | Freq: Two times a day (BID) | ORAL | Status: DC

## 2012-05-14 NOTE — Addendum Note (Signed)
Addended by: Duncan Dull on: 05/14/2012 06:32 AM   Modules accepted: Orders

## 2012-05-14 NOTE — Addendum Note (Signed)
Addended by: Duncan Dull on: 05/14/2012 06:29 AM   Modules accepted: Orders

## 2012-06-12 ENCOUNTER — Inpatient Hospital Stay: Payer: Self-pay | Admitting: Internal Medicine

## 2012-06-12 LAB — COMPREHENSIVE METABOLIC PANEL
Albumin: 3.7 g/dL (ref 3.4–5.0)
Alkaline Phosphatase: 46 U/L — ABNORMAL LOW (ref 50–136)
Calcium, Total: 9.2 mg/dL (ref 8.5–10.1)
Chloride: 104 mmol/L (ref 98–107)
Co2: 23 mmol/L (ref 21–32)
Creatinine: 1.44 mg/dL — ABNORMAL HIGH (ref 0.60–1.30)
EGFR (African American): 60 — ABNORMAL LOW
Glucose: 261 mg/dL — ABNORMAL HIGH (ref 65–99)
Osmolality: 286 (ref 275–301)
SGOT(AST): 27 U/L (ref 15–37)
Sodium: 138 mmol/L (ref 136–145)

## 2012-06-12 LAB — CBC WITH DIFFERENTIAL/PLATELET
Basophil #: 0.1 10*3/uL (ref 0.0–0.1)
Eosinophil %: 1 %
HCT: 45.7 % (ref 40.0–52.0)
Lymphocyte #: 1.9 10*3/uL (ref 1.0–3.6)
MCH: 29.5 pg (ref 26.0–34.0)
MCV: 87 fL (ref 80–100)
Monocyte #: 1.4 x10 3/mm — ABNORMAL HIGH (ref 0.2–1.0)
Monocyte %: 9.9 %
Neutrophil #: 10.8 10*3/uL — ABNORMAL HIGH (ref 1.4–6.5)
Platelet: 169 10*3/uL (ref 150–440)
RDW: 14.6 % — ABNORMAL HIGH (ref 11.5–14.5)
WBC: 14.4 10*3/uL — ABNORMAL HIGH (ref 3.8–10.6)

## 2012-06-12 LAB — TROPONIN I: Troponin-I: <0.02 ng/mL

## 2012-06-13 LAB — HEMOGLOBIN A1C: Hemoglobin A1C: 9.3 % — ABNORMAL HIGH (ref 4.2–6.3)

## 2012-06-14 ENCOUNTER — Telehealth: Payer: Self-pay | Admitting: Internal Medicine

## 2012-06-14 LAB — BASIC METABOLIC PANEL
BUN: 13 mg/dL (ref 7–18)
Co2: 22 mmol/L (ref 21–32)
EGFR (Non-African Amer.): >60
Glucose: 275 mg/dL — ABNORMAL HIGH (ref 65–99)
Potassium: 4.3 mmol/L (ref 3.5–5.1)
Sodium: 142 mmol/L (ref 136–145)

## 2012-06-14 NOTE — Telephone Encounter (Signed)
Patient being discharged from [facility].  They will send discharge summary.

## 2012-06-15 LAB — CEA: CEA: 1 ng/mL (ref 0.0–4.7)

## 2012-06-15 LAB — PSA: PSA: 2.1 ng/mL (ref 0.0–4.0)

## 2012-06-20 ENCOUNTER — Ambulatory Visit: Payer: Self-pay | Admitting: Internal Medicine

## 2012-07-23 ENCOUNTER — Ambulatory Visit (INDEPENDENT_AMBULATORY_CARE_PROVIDER_SITE_OTHER): Payer: PRIVATE HEALTH INSURANCE | Admitting: Internal Medicine

## 2012-07-23 ENCOUNTER — Encounter: Payer: Self-pay | Admitting: Internal Medicine

## 2012-07-23 VITALS — BP 160/78 | HR 90 | Temp 98.2°F | Resp 12 | Wt 232.5 lb

## 2012-07-23 DIAGNOSIS — I70209 Unspecified atherosclerosis of native arteries of extremities, unspecified extremity: Secondary | ICD-10-CM

## 2012-07-23 DIAGNOSIS — I2699 Other pulmonary embolism without acute cor pulmonale: Secondary | ICD-10-CM

## 2012-07-23 DIAGNOSIS — IMO0002 Reserved for concepts with insufficient information to code with codable children: Secondary | ICD-10-CM

## 2012-07-23 DIAGNOSIS — I739 Peripheral vascular disease, unspecified: Secondary | ICD-10-CM

## 2012-07-23 DIAGNOSIS — E1159 Type 2 diabetes mellitus with other circulatory complications: Secondary | ICD-10-CM

## 2012-07-23 DIAGNOSIS — Z86718 Personal history of other venous thrombosis and embolism: Secondary | ICD-10-CM

## 2012-07-23 DIAGNOSIS — M48062 Spinal stenosis, lumbar region with neurogenic claudication: Secondary | ICD-10-CM

## 2012-07-23 DIAGNOSIS — M5416 Radiculopathy, lumbar region: Secondary | ICD-10-CM

## 2012-07-23 DIAGNOSIS — E1151 Type 2 diabetes mellitus with diabetic peripheral angiopathy without gangrene: Secondary | ICD-10-CM

## 2012-07-23 MED ORDER — FREESTYLE SYSTEM KIT: 1.0000 | PACK | Status: AC | PRN

## 2012-07-23 NOTE — Assessment & Plan Note (Addendum)
His left foot is warm and well perfused but his DP pulse is barely palpable.  Records requested from [facility].  Needs local vascular follow up with Festus Barren

## 2012-07-23 NOTE — Patient Instructions (Addendum)
I want you to start checking your blood sugars  Twice daily  Fasting (am before any food or drink except water)  2 hours after various meals (post prandial)   Bring back to me in 3 weeks in follow up visit, (the week after Christmas) and we will repeat your hgba1c     Referral for MRI Lumbar spine to be done [facility] for better detail  Referral to Dr Wyn Quaker for follow up on your blood clot and circulation

## 2012-07-23 NOTE — Progress Notes (Addendum)
Patient ID: Johnathan Hernandez, male   DOB: 05/15/1951, 61 y.o.   MRN: [id number]  Patient Active Problem List  Diagnosis  . Peripheral vascular disease  . History of tobacco abuse  . Coronary artery disease  . 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 Hyperlipidemia LDL goal < 70  . Hypertension associated with diabetes  . Vasomotor flushing  . Radiculopathy of lumbar region    Subjective:  CC:   Chief Complaint  Patient presents with  . Hypertension  . Leg Pain    HPI:   Johnathan Hernandez Mayis a 61 y.o. male who presents for Hospital follow up.  He has had a complicated history beginning with a back injury in [place] 2012. Underwent lumbar spine surgery by Dr. Raford Pitcher in  October 2012 after failing epidural injections by physiatrist and sports medicine/PT.  Back surgery failed as well and he had continued pain an decreased mobility.  He developed  First DVT in Nov 2013 due to inactivity  And was admitted to [facility] and placed on Xarelto.  He was admitted to [facility] emergently when he developed severe chest pain during vascular eval and found to have recurrent PE.  With probable pulmonary infarct.  Per patient he was told he had multiple clots in his lowere extremities.  Does not recall palcement of a greenfield filter , dc summary not available at time of visit..  Was not given any vascular followup and is not sure whether he is supposed to return to [facility] or see Festus Barren for follow up for multiple blood clots in left leg.  Discharged on Friday  Nov 8th.  Leg started swelling about 4 days ago and he is Currently having increased pain in both legs and i n his left foot. However he has chronic pain in both legs due and history of PAD.  His DP not palpable on left.  Leg pain is aggravated by walking  And attributed to sciatica .  Last MRI of lumbar spine was in 2012.    2) Foot pain started in December. Etiology unclear,  Was treated with Lyrica and epidural spine injections by Dr. Arvin Collard  at the Pain clinic at [facility] for foot pain due to presumed neuropathy . Howevere Dr. Arvin Collard discharged him form the Clinic because he was receiving therapy through [employer] at the [place] [facility] Dr. Barbette Merino  for back pain related to prior injury.  However Dr Eduard Clos is not treating his foot pain and hus thus far prescribed only oral pain medicaitons which are sedating him. Alternative medicatiions  Have been prescribed but not picked up  By patient yet.        Past Medical History  Diagnosis Date  . Diabetes mellitus   . Hypertension   . Hyperlipidemia   . Peripheral vascular disease Dec 2012    [facility], treated with extensive arterial bypass  . History of tobacco abuse     quit in 2006 after 5 vessel cabg  . Coronary artery disease     Past Surgical History  Procedure Date  . Coronary artery bypass graft 2006    [place]  . Aorta - iliac artery bypass graft dec 2012    [facility]         The following portions of the patient's history were reviewed and updated as appropriate: Allergies, current medications, and problem list.    Review of Systems:   12 Pt  review of systems was negative except those  addressed in the HPI,     History   Social History  . Marital Status: Married    Spouse Name: N/A    Number of Children: N/A  . Years of Education: N/A   Occupational History  . Not on file.   Social History Main Topics  . Smoking status: Former Smoker    Quit date: 11/13/2004  . Smokeless tobacco: Never Used  . Alcohol Use: No  . Drug Use: No  . Sexually Active: Not on file   Other Topics Concern  . Not on file   Social History Narrative  . No narrative on file    Objective:  BP 160/78  Pulse 90  Temp 98.2 F (36.8 C) (Oral)  Resp 12  Wt 232 lb 8 oz (105.461 kg)  SpO2 96%  General appearance: alert, cooperative and appears stated age Ears: normal TM's and external ear canals both ears Throat: lips, mucosa, and tongue normal; teeth and gums  normal Neck: no adenopathy, no carotid bruit, supple, symmetrical, trachea midline and thyroid not enlarged, symmetric, no tenderness/mass/nodules Back: symmetric, no curvature. ROM normal. No CVA tenderness. Lungs: clear to auscultation bilaterally Heart: regular rate and rhythm, S1, S2 normal, no murmur, click, rub or gallop Abdomen: soft, non-tender; bowel sounds normal; no masses,  no organomegaly Pulses: feeble left DP pulse,  Foot is warm and well perfused.  Skin: Skin color, texture, turgor normal. No rashes or lesions Lymph nodes: Cervical, supraclavicular, and axillary nodes normal.  Assessment and Plan: Peripheral vascular disease His left foot is warm and well perfused but his DP pulse is barely palpable.  Records requested from [facility].  Needs local vascular follow up with Festus Barren   Radiculopathy of lumbar region Presumed, given history.   Continue Lyrical, follow up with Pain clinic in [place]. It is not clear to me why the Work[employer] comp referral resulting in Pain clinic management in [place] by Dr. Eduard Clos will not suffice for all of his issues or even whether he has discrete issues. Lumbar MRI needed as none has been done in last year.  Ordered today, to be done in [place] uin the event he needs neurosurgical evaluation . Records requested from both pain clinic.   Diabetes mellitus with atherosclerosis of arteries of extremities Has not been checking sugars,  Last A1c was > 8.0  New glucometer ordered,.  Patient to check twice daily and return in two weeks with blood sugar log for adjustment of medications.   PE (pulmonary embolism) patient on Xarelto since Pe first diagnosed at [facility] in Nov ,  Recurrent PE with LLE DVT found by [facility] per patient.  No IV filter placed,  Unclear why.  Records requested,  Referral to Dr. Wyn Quaker for conisderation of UVC filter    Updated Medication List Outpatient Encounter Prescriptions as of 07/23/2012  Medication Sig Dispense Refill  . aspirin 81 MG tablet Take  81 mg by mouth daily.      . Cholecalciferol (VITAMIN D3) 2000 UNITS TABS Take by mouth.      Tery Sanfilippo Calcium (STOOL SOFTENER PO) Take by mouth.      . enalapril (VASOTEC) 2.5 MG tablet Take 1 tablet (2.5 mg total) by mouth daily.  90 tablet  3  . esomeprazole (NEXIUM) 40 MG capsule Take 1 capsule (40 mg total) by mouth daily.  30 capsule  3  . fish oil-omega-3 fatty acids 1000 MG capsule Take 2 g by mouth daily.      [name] glipiZIDE (  GLUCOTROL) 5 MG tablet Take 2 tablets (10 mg total) by mouth 2 (two) times daily before a meal.  60 tablet  3  . metaxalone (SKELAXIN) 800 MG tablet Take 800 mg by mouth 3 (three) times daily.      . metFORMIN (GLUCOPHAGE-XR) 500 MG 24 hr tablet Take 2 tablets (1,000 mg total) by mouth daily with breakfast.  60 tablet  11  . metoprolol succinate (TOPROL-XL) 100 MG 24 hr tablet Take 1 tablet (100 mg total) by mouth daily. Take with or immediately following a meal.  90 tablet  3  . Multiple Vitamin (MULTIVITAMIN) tablet Take 1 tablet by mouth daily.      . nortriptyline (PAMELOR) 25 MG capsule Take 25 mg by mouth at bedtime.      [name] omeprazole (PRILOSEC) 20 MG capsule Take 20 mg by mouth daily.      . pravastatin (PRAVACHOL) 40 MG tablet Take 1 tablet (40 mg total) by mouth daily.  90 tablet  3  . pregabalin (LYRICA) 75 MG capsule Take 75 mg by mouth 2 (two) times daily.      . Rivaroxaban (XARELTO) 20 MG TABS Take 20 mg by mouth daily.      . traMADol (ULTRAM) 50 MG tablet Take 50 mg by mouth every 6 (six) hours as needed.      . vitamin B-12 (CYANOCOBALAMIN) 1000 MCG tablet Take 1,000 mcg by mouth daily.      [name] glucose monitoring kit (FREESTYLE) monitoring kit 1 each by Does not apply route as needed for other. PATIENT TO CHOOSE THE GLUCOMETER OF HIS CHOICE  1 each  1

## 2012-07-24 DIAGNOSIS — M5416 Radiculopathy, lumbar region: Secondary | ICD-10-CM | POA: Insufficient documentation

## 2012-07-24 DIAGNOSIS — Z86711 Personal history of pulmonary embolism: Secondary | ICD-10-CM | POA: Insufficient documentation

## 2012-07-24 NOTE — Assessment & Plan Note (Addendum)
Presumed, given history.   Continue Lyrical, follow up with Pain clinic in [place]. It is not clear to me why the Workmens comp referral resulting in Pain clinic management in [place] by Dr. Eduard Clos will not suffice for all of his issues or even whether he has discrete issues. Lumbar MRI needed as none has been done in last year.  Ordered today, to be done in [place] uin the event he needs neurosurgical evaluation . Records requested from both pain clinic.

## 2012-07-24 NOTE — Assessment & Plan Note (Signed)
Has not been checking sugars,  Last A1c was > 8.0  New glucometer ordered,.  Patient to check twice daily and return in two weeks with blood sugar log for adjustment of medications.

## 2012-07-24 NOTE — Assessment & Plan Note (Signed)
patient on Xarelto since Pe first diagnosed at [facility] in Nov ,  Recurrent PE with LLE DVT found by [facility] per patient.  No IV filter placed,  Unclear why.  Records requested,  Referral to Dr. Wyn Quaker for conisderation of UVC filter

## 2012-08-12 NOTE — Assessment & Plan Note (Signed)
Will refer to Neurology , Gavin Potters for evaluation with EMG nerve conduction studies to determine source of his pain ./

## 2012-08-13 ENCOUNTER — Ambulatory Visit (INDEPENDENT_AMBULATORY_CARE_PROVIDER_SITE_OTHER): Payer: PRIVATE HEALTH INSURANCE | Admitting: Internal Medicine

## 2012-08-13 ENCOUNTER — Encounter: Payer: Self-pay | Admitting: Internal Medicine

## 2012-08-13 VITALS — BP 136/79 | HR 73 | Temp 97.8°F | Resp 12 | Ht 68.0 in | Wt 235.0 lb

## 2012-08-13 DIAGNOSIS — IMO0002 Reserved for concepts with insufficient information to code with codable children: Secondary | ICD-10-CM

## 2012-08-13 DIAGNOSIS — M79609 Pain in unspecified limb: Secondary | ICD-10-CM

## 2012-08-13 DIAGNOSIS — E1151 Type 2 diabetes mellitus with diabetic peripheral angiopathy without gangrene: Secondary | ICD-10-CM

## 2012-08-13 DIAGNOSIS — R232 Flushing: Secondary | ICD-10-CM

## 2012-08-13 DIAGNOSIS — M5416 Radiculopathy, lumbar region: Secondary | ICD-10-CM

## 2012-08-13 DIAGNOSIS — E1159 Type 2 diabetes mellitus with other circulatory complications: Secondary | ICD-10-CM

## 2012-08-13 DIAGNOSIS — M79669 Pain in unspecified lower leg: Secondary | ICD-10-CM

## 2012-08-13 DIAGNOSIS — I70209 Unspecified atherosclerosis of native arteries of extremities, unspecified extremity: Secondary | ICD-10-CM

## 2012-08-13 LAB — COMPREHENSIVE METABOLIC PANEL
ALT: 48 U/L (ref 0–53)
Alkaline Phosphatase: 30 U/L — ABNORMAL LOW (ref 39–117)
CO2: 29 mEq/L (ref 19–32)
Sodium: 139 mEq/L (ref 135–145)
Total Bilirubin: 0.3 mg/dL (ref 0.3–1.2)
Total Protein: 6.9 g/dL (ref 6.0–8.3)

## 2012-08-13 LAB — HEMOGLOBIN A1C: Hgb A1c MFr Bld: 10.5 % — ABNORMAL HIGH (ref 4.6–6.5)

## 2012-08-13 NOTE — Progress Notes (Signed)
Patient ID: Johnathan Hernandez, male   DOB: 30-Sep-1950, 61 y.o.   MRN: [id number]  Patient Active Problem List  Diagnosis  . Peripheral vascular disease  . History of tobacco abuse  . Coronary artery disease  . 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 Hyperlipidemia LDL goal < 70  . Hypertension associated with diabetes  . Vasomotor flushing  . Radiculopathy of lumbar region  . PE (pulmonary embolism)    Subjective:  CC:   Chief Complaint  Patient presents with  . Follow-up    HPI:   Johnathan Hernandez a 61 y.o. male who presents for follow up on diabetes, recent DVT/PE and chronic pain .  Since last visit he had local vascular follow up with Dr. Wyn Quaker.  Records are not available but he states that ultrasounds were done revealing no current blood clots. Dr. Wyn Quaker agreed that an IVC filter would have been appropriate when he was treated for the second DVT.  2)  New complaint of left shin pain occurring several times daily, both at night  As well as daily .  Aggravated by walking  and by sleeping, sore to the touch feels like his tibia was struck .  He is scheuled for ABIS nest week. 3) DM follow up.  Has been checking sugars twice daily and most are > 160 fasting and < 200 post prandially.  He is taking his medications as prescribed.  No lows.  Occasional diarrhea one or two times weekly,    Past Medical History  Diagnosis Date  . Diabetes mellitus   . Hypertension   . Hyperlipidemia   . Peripheral vascular disease Dec 2012    [facility], treated with extensive arterial bypass  . History of tobacco abuse     quit in 2006 after 5 vessel cabg  . Coronary artery disease     Past Surgical History  Procedure Date  . Coronary artery bypass graft 2006      . Aorta - iliac artery bypass graft dec 2012    [facility]         The following portions of the patient's history were reviewed and updated as appropriate: Allergies, current medications, and problem  list.    Review of Systems:   12 Pt  review of systems was negative except those addressed in the HPI,     History   Social History  . Marital Status: Married    Spouse Name: N/A    Number of Children: N/A  . Years of Education: N/A   Occupational History  . Not on file.   Social History Main Topics  . Smoking status: Former Smoker    Quit date: 11/13/2004  . Smokeless tobacco: Never Used  . Alcohol Use: No  . Drug Use: No  . Sexually Active: Not on file   Other Topics Concern  . Not on file   Social History Narrative  . No narrative on file    Objective:  BP 136/79  Pulse 73  Temp 97.8 F (36.6 C) (Oral)  Resp 12  Ht 5\' 8"  (1.727 m)  Wt 235 lb (106.595 kg)  BMI 35.73 kg/m2  SpO2 94%  General appearance: alert, cooperative and appears stated age Ears: normal TM's and external ear canals both ears Throat: lips, mucosa, and tongue normal; teeth and gums normal Neck: no adenopathy, no carotid bruit, supple, symmetrical, trachea midline and thyroid not enlarged, symmetric, no tenderness/mass/nodules Back: symmetric, no curvature. ROM normal. No  CVA tenderness. Lungs: clear to auscultation bilaterally Heart: regular rate and rhythm, S1, S2 normal, no murmur, click, rub or gallop Abdomen: soft, non-tender; bowel sounds normal; no masses,  no organomegaly Pulses: 2+ and symmetric Skin: Skin color, texture, turgor normal. No rashes or lesions Lymph nodes: Cervical, supraclavicular, and axillary nodes normal.  Assessment and Plan:  Radiculopathy of lumbar region Will refer to Neurology , Gavin Potters for evaluation with EMG nerve conduction studies to determine source of his pain ./   Diabetes mellitus with atherosclerosis of arteries of extremities Samples of janumet XR 100/1000 given in anticipation for need of additional meds .  A1c drawn today. Taking asa, statin,  ACE inhibitor.  Annul eye exam is up to date.   Vasomotor flushing Checking testosterone and  other hormones today sincehis urine 5H1AA was normal   Updated Medication List Outpatient Encounter Prescriptions as of 08/13/2012  Medication Sig Dispense Refill  . aspirin 81 MG tablet Take 81 mg by mouth daily.      . Cholecalciferol (VITAMIN D3) 2000 UNITS TABS Take by mouth.      Tery Sanfilippo Calcium (STOOL SOFTENER PO) Take by mouth.      . enalapril (VASOTEC) 2.5 MG tablet Take 1 tablet (2.5 mg total) by mouth daily.  90 tablet  3  . fish oil-omega-3 fatty acids 1000 MG capsule Take 2 g by mouth daily.      [name] glipiZIDE (GLUCOTROL) 5 MG tablet Take 2 tablets (10 mg total) by mouth 2 (two) times daily before a meal.  60 tablet  3  . glucose monitoring kit (FREESTYLE) monitoring kit 1 each by Does not apply route as needed for other. PATIENT TO CHOOSE THE GLUCOMETER OF HIS CHOICE  1 each  1  . metaxalone (SKELAXIN) 800 MG tablet Take 800 mg by mouth 3 (three) times daily.      . metFORMIN (GLUCOPHAGE-XR) 500 MG 24 hr tablet Take 2 tablets (1,000 mg total) by mouth daily with breakfast.  60 tablet  11  . metoprolol succinate (TOPROL-XL) 100 MG 24 hr tablet Take 1 tablet (100 mg total) by mouth daily. Take with or immediately following a meal.  90 tablet  3  . Multiple Vitamin (MULTIVITAMIN) tablet Take 1 tablet by mouth daily.      . nortriptyline (PAMELOR) 25 MG capsule Take 25 mg by mouth at bedtime.      [name] omeprazole (PRILOSEC) 20 MG capsule Take 20 mg by mouth daily.      . pravastatin (PRAVACHOL) 40 MG tablet Take 1 tablet (40 mg total) by mouth daily.  90 tablet  3  . Rivaroxaban (XARELTO) 20 MG TABS Take 20 mg by mouth daily.      . traMADol (ULTRAM) 50 MG tablet Take 50 mg by mouth every 6 (six) hours as needed.      . vitamin B-12 (CYANOCOBALAMIN) 1000 MCG tablet Take 1,000 mcg by mouth daily.      [name] esomeprazole (NEXIUM) 40 MG capsule Take 1 capsule (40 mg total) by mouth daily.  30 capsule  3  . pregabalin (LYRICA) 75 MG capsule Take 75 mg by mouth 2 (two) times daily.          Orders Placed This Encounter  Procedures  . DG Tibia/Fibula Left  . Hemoglobin A1c  . Comprehensive metabolic panel  . Ambulatory referral to Neurology  . HM DIABETES EYE EXAM  . HM DIABETES FOOT EXAM    No Follow-up on file.

## 2012-08-13 NOTE — Assessment & Plan Note (Signed)
Checking testosterone and other hormones today sincehis urine 5H1AA was normal

## 2012-08-13 NOTE — Patient Instructions (Addendum)
We will check your A!C today . hold on to the janumet samples until you hear from me.   Referral to neurology under way   You need to lose weight  It will help your back pain  This is  my version of a  "Low GI"  Diet:  All of the foods can be found at grocery stores and in bulk at [employer].  The Atkins protein bars and shakes are available in more varieties at [employer], [employer] and [employer].     7 AM Breakfast:  Low carbohydrate Protein  Shakes (I recommend the EAS AdvantEdge "Carb Control" shakes  Or the low carb shakes by Atkins.   Both are available everywhere:  In  cases at [employer]Js  Or in 4 packs at grocery stores and pharmacies  2.5 carbs  (Alternative is  a toasted Arnold's Sandwhich Thin w/ peanut butter, a "Bagel Thin" with cream cheese and salmon) or  a scrambled egg burrito made with a low carb tortilla .  Avoid cereal and bananas, oatmeal too unless you are cooking the old fashioned kind that takes 30-40 minutes to prepare.  the rest is overly processed, has minimal fiber, and is loaded with carbohydrates!   10 AM: Protein bar by Atkins (the snack size, under 200 cal).  There are many varieties , available widely again or in bulk in limited varieties at [employer]Js)  Other so called "protein bars" tend to be loaded with carbohydrates.  Remember, in food advertising, the word "energy" is synonymous for " carbohydrate."  Lunch: sandwich of [place], (or any lunchmeat, grilled meat or canned tuna), fresh avocado, mayonnaise  and cheese on a lower carbohydrate pita bread, flatbread, or tortilla . Ok to use regular mayonnaise. The bread is the only source or carbohydrate that can be decreased (Joseph's makes a pita bread and a flat bread that are 50 cal and 4 net carbs ; Toufayan makes a low carb flatbread that's 100 cal and 9 net carbs  and  [place] makes a low carb whole wheat tortilla  That is 210 cal and 6 net carbs)  3 PM:  Mid day :  Another protein bar,  Or a  cheese stick (100 cal, 0 carbs),  Or 1  ounce of  almonds, walnuts, pistachios, pecans, peanuts,  Macadamia nuts. Or a Dannon light n Fit greek yogurt, 80 cal 8 net carbs . Avoid "granola"; the dried cranberries and raisins are loaded with carbohydrates. Mixed nuts ok if no raisins or cranberries or dried fruit.      6 PM  Dinner:  "mean and green:"  Meat/chicken/fish or a high protein legume; , with a green salad, and a low GI  Veggie (broccoli, cauliflower, green beans, spinach, brussel sprouts. Lima beans) : Avoid "Low fat dressings, as well as Reyne Dumas and [address]! They are loaded with sugar! Instead use ranch, vinagrette,  Blue cheese, etc.  There is a low carb pasta by Dreamfield's available at [employer] [facility] that is acceptable and tastes great. Try Michel Angel's chicken piccata over low carb pasta. The chicken dish is 0 carbs, and can be found in frozen section at [employer]Js and [employer]. Also try [employer] "Carnitas" (pulled pork, no sauce,  0 carbs) and his pot roast.   both are in the refrigerated section at BJs   9 PM snack : Breyer's "low carb" fudgsicle or  ice cream bar (Carb Smart line), or  Weight Watcher's ice cream bar , or another "no  sugar added" ice cream;a serving of fresh berries/cherries with whipped cream (Avoid bananas, pineapple, grapes  and watermelon on a regular basis because they are high in sugar)   Remember that snack Substitutions should be less than 15 to 20 carbs  Per serving. Remember to subtract fiber grams and sugar alcohols to get the "net carbs."

## 2012-08-13 NOTE — Assessment & Plan Note (Addendum)
Samples of janumet XR 100/1000 given in anticipation for need of additional meds .  A1c drawn today. Taking asa, statin,  ACE inhibitor.  Annul eye exam is up to date.

## 2012-08-14 LAB — TESTOSTERONE, FREE, TOTAL, SHBG
Sex Hormone Binding: 27 nmol/L (ref 13–71)
Testosterone, Free: 37.3 pg/mL — ABNORMAL LOW (ref 47.0–244.0)
Testosterone-% Free: 2.1 % (ref 1.6–2.9)

## 2012-08-14 LAB — FOLLICLE STIMULATING HORMONE: FSH: 6.7 m[IU]/mL (ref 1.4–18.1)

## 2012-08-14 LAB — LUTEINIZING HORMONE: LH: 6.44 m[IU]/mL (ref 1.50–9.30)

## 2012-08-14 MED ORDER — SITAGLIP PHOS-METFORMIN HCL ER 100-1000 MG PO TB24: 1.0000 | ORAL_TABLET | Freq: Every day | ORAL | Status: DC

## 2012-08-14 NOTE — Addendum Note (Signed)
Addended by: Sherlene Shams on: 08/14/2012 11:51 PM   Modules accepted: Orders

## 2012-09-01 ENCOUNTER — Encounter: Payer: Self-pay | Admitting: Internal Medicine

## 2012-09-05 ENCOUNTER — Telehealth: Payer: Self-pay | Admitting: Internal Medicine

## 2012-09-05 MED ORDER — METOPROLOL SUCCINATE ER 100 MG PO TB24: 100.0000 mg | ORAL_TABLET | Freq: Every day | ORAL | Status: DC

## 2012-09-05 NOTE — Telephone Encounter (Signed)
Pt states he called in on Friday for his metoprolol 50 mg BP medication.  Pt states [facility] states they still have not received a call.

## 2012-09-05 NOTE — Telephone Encounter (Signed)
Med filled.  

## 2012-09-10 DIAGNOSIS — E1142 Type 2 diabetes mellitus with diabetic polyneuropathy: Secondary | ICD-10-CM | POA: Insufficient documentation

## 2012-09-10 DIAGNOSIS — R6889 Other general symptoms and signs: Secondary | ICD-10-CM | POA: Insufficient documentation

## 2012-09-10 DIAGNOSIS — D518 Other vitamin B12 deficiency anemias: Secondary | ICD-10-CM | POA: Insufficient documentation

## 2012-09-17 ENCOUNTER — Other Ambulatory Visit: Payer: Self-pay | Admitting: General Practice

## 2012-09-17 ENCOUNTER — Encounter: Payer: Self-pay | Admitting: Internal Medicine

## 2012-09-17 ENCOUNTER — Ambulatory Visit (INDEPENDENT_AMBULATORY_CARE_PROVIDER_SITE_OTHER): Payer: PRIVATE HEALTH INSURANCE | Admitting: Internal Medicine

## 2012-09-17 VITALS — BP 134/82 | HR 84 | Temp 98.1°F | Resp 16 | Wt 229.5 lb

## 2012-09-17 DIAGNOSIS — E538 Deficiency of other specified B group vitamins: Secondary | ICD-10-CM

## 2012-09-17 DIAGNOSIS — E1159 Type 2 diabetes mellitus with other circulatory complications: Secondary | ICD-10-CM

## 2012-09-17 DIAGNOSIS — G629 Polyneuropathy, unspecified: Secondary | ICD-10-CM

## 2012-09-17 DIAGNOSIS — E1151 Type 2 diabetes mellitus with diabetic peripheral angiopathy without gangrene: Secondary | ICD-10-CM

## 2012-09-17 DIAGNOSIS — G589 Mononeuropathy, unspecified: Secondary | ICD-10-CM

## 2012-09-17 DIAGNOSIS — I70209 Unspecified atherosclerosis of native arteries of extremities, unspecified extremity: Secondary | ICD-10-CM

## 2012-09-17 DIAGNOSIS — IMO0002 Reserved for concepts with insufficient information to code with codable children: Secondary | ICD-10-CM

## 2012-09-17 DIAGNOSIS — M5416 Radiculopathy, lumbar region: Secondary | ICD-10-CM

## 2012-09-17 MED ORDER — METFORMIN HCL 1000 MG PO TABS: 1000.0000 mg | ORAL_TABLET | Freq: Two times a day (BID) | ORAL | Status: DC

## 2012-09-17 MED ORDER — GLIPIZIDE 5 MG PO TABS: 10.0000 mg | ORAL_TABLET | Freq: Two times a day (BID) | ORAL | Status: DC

## 2012-09-17 MED ORDER — SITAGLIPTIN PHOSPHATE 100 MG PO TABS: 100.0000 mg | ORAL_TABLET | Freq: Every day | ORAL | Status: DC

## 2012-09-17 MED ORDER — GLIPIZIDE 10 MG PO TABS: 10.0000 mg | ORAL_TABLET | Freq: Two times a day (BID) | ORAL | Status: DC

## 2012-09-17 MED ORDER — RIVAROXABAN 20 MG PO TABS: 20.0000 mg | ORAL_TABLET | Freq: Every day | ORAL | Status: DC

## 2012-09-17 MED ORDER — HYDROCODONE-ACETAMINOPHEN 5-325 MG PO TABS: 1.0000 | ORAL_TABLET | Freq: Four times a day (QID) | ORAL | Status: DC | PRN

## 2012-09-17 NOTE — Progress Notes (Signed)
Patient ID: Johnathan Hernandez, male   DOB: 1951-08-07, 62 y.o.   MRN: [id number]   Patient Active Problem List  Diagnosis  . Peripheral vascular disease  . History of tobacco abuse  . Coronary artery disease  . 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 Hyperlipidemia LDL goal < 70  . Hypertension associated with diabetes  . Vasomotor flushing  . Radiculopathy of lumbar region  . PE (pulmonary embolism)    Subjective:  CC:   Chief Complaint  Patient presents with  . Follow-up    HPI:   Johnathan Hernandez a 62 y.o. male who presents for follow up on multiple conditions,  He remains very aggravated by his left leg pain.  The pain has been more severe since he was referred for PT .  He was evaluated by Lesia Sago with EMG/Brenas studies and a physical exam and has ordered an MRI of the lumbar spine to be done tomorrow.  His pain was moderately controlled with Tramadol and skelaxin but Dr.  Eduard Clos changed ti tizanidine 2 mg which has not helped at all.  Taking lyrica and tylenol. Leg wakes hip up at night like it is on fire.  Had apnea with percocet.  vicodin made him hallucinate.   2) Diabetes mellitus: last A1c was 10.5 in December and janumet was started,  Continued glipzide 10 mg bid .  He brings one week of blood sugars all of which are above goal but below 200.    Past Medical History  Diagnosis Date  . Diabetes mellitus   . Hypertension   . Hyperlipidemia   . Peripheral vascular disease Dec 2012    [facility], treated with extensive arterial bypass  . History of tobacco abuse     quit in 2006 after 5 vessel cabg  . Coronary artery disease     Past Surgical History  Procedure Date  . Coronary artery bypass graft 2006    [place]  . Aorta - iliac artery bypass graft dec 2012    [facility]         The following portions of the patient's history were reviewed and updated as appropriate: Allergies, current medications, and problem list.    Review of  Systems:  Patient denies headache, fevers, malaise, unintentional weight loss, skin rash, eye pain, sinus congestion and sinus pain, sore throat, dysphagia,  hemoptysis , cough, dyspnea, wheezing, chest pain, palpitations, orthopnea, edema, abdominal pain, nausea, melena, diarrhea, constipation, flank pain, dysuria, hematuria, urinary  Frequency, nocturia, numbness, tingling, seizures,  Focal weakness, Loss of consciousness,  Tremor, insomnia, depression, anxiety, and suicidal ideation.       History   Social History  . Marital Status: Married    Spouse Name: N/A    Number of Children: N/A  . Years of Education: N/A   Occupational History  . Not on file.   Social History Main Topics  . Smoking status: Former Smoker    Quit date: 11/13/2004  . Smokeless tobacco: Never Used  . Alcohol Use: No  . Drug Use: No  . Sexually Active: Not on file   Other Topics Concern  . Not on file   Social History Narrative  . No narrative on file    Objective:  BP 134/82  Pulse 84  Temp 98.1 F (36.7 C) (Oral)  Resp 16  Wt 229 lb 8 oz (104.101 kg)  SpO2 97%  General appearance: alert, cooperative and appears stated age Ears: normal TM's and  external ear canals both ears Throat: lips, mucosa, and tongue normal; teeth and gums normal Neck: no adenopathy, no carotid bruit, supple, symmetrical, trachea midline and thyroid not enlarged, symmetric, no tenderness/mass/nodules Back: symmetric, no curvature. ROM normal. No CVA tenderness. Lungs: clear to auscultation bilaterally Heart: regular rate and rhythm, S1, S2 normal, no murmur, click, rub or gallop Abdomen: soft, non-tender; bowel sounds normal; no masses,  no organomegaly Pulses: 2+ and symmetric Skin: Skin color, texture, turgor normal. No rashes or lesions Lymph nodes: Cervical, supraclavicular, and axillary nodes normal.  Assessment and Plan:  Diabetes mellitus with atherosclerosis of arteries of extremities Diabetes is  uncontrolled.  Adding januvia 100 mg daily . samples given.  Vascular exam reassuring   Radiculopathy of lumbar region Multifactorial by recnet neurology evaluation,  Checking B12, ANA, SPEP/ IFE and  RA, etc by Neurology.  His pain is constant and uncontrolled on current medications including Lyrica and tizanidine,  Adding vicodin.  MRI ordered of lumbar spine by Neurology    Updated Medication List Outpatient Encounter Prescriptions as of 09/17/2012  Medication Sig Dispense Refill  . aspirin 81 MG tablet Take 81 mg by mouth daily.      . Cholecalciferol (VITAMIN D3) 2000 UNITS TABS Take by mouth.      Tery Sanfilippo Calcium (STOOL SOFTENER PO) Take by mouth.      . enalapril (VASOTEC) 2.5 MG tablet Take 1 tablet (2.5 mg total) by mouth daily.  90 tablet  3  . esomeprazole (NEXIUM) 40 MG capsule Take 1 capsule (40 mg total) by mouth daily.  30 capsule  3  . fish oil-omega-3 fatty acids 1000 MG capsule Take 2 g by mouth daily.      [name] glucose monitoring kit (FREESTYLE) monitoring kit 1 each by Does not apply route as needed for other. PATIENT TO CHOOSE THE GLUCOMETER OF HIS CHOICE  1 each  1  . metoprolol succinate (TOPROL-XL) 100 MG 24 hr tablet Take 1 tablet (100 mg total) by mouth daily. Take with or immediately following a meal.  90 tablet  3  . Multiple Vitamin (MULTIVITAMIN) tablet Take 1 tablet by mouth daily.      . nortriptyline (PAMELOR) 25 MG capsule Take 25 mg by mouth at bedtime.      [name] omeprazole (PRILOSEC) 20 MG capsule Take 20 mg by mouth daily.      . pravastatin (PRAVACHOL) 40 MG tablet Take 1 tablet (40 mg total) by mouth daily.  90 tablet  3  . pregabalin (LYRICA) 75 MG capsule Take 75 mg by mouth 2 (two) times daily.      . tizanidine (ZANAFLEX) 2 MG capsule Take 2 mg by mouth 3 (three) times daily.      . vitamin B-12 (CYANOCOBALAMIN) 1000 MCG tablet Take 1,000 mcg by mouth daily.      . [DISCONTINUED] glipiZIDE (GLUCOTROL) 5 MG tablet Take 2 tablets (10 mg total) by mouth 2  (two) times daily before a meal.  60 tablet  3  . [DISCONTINUED] Rivaroxaban (XARELTO) 20 MG TABS Take 20 mg by mouth daily.      . [DISCONTINUED] SitaGLIPtin-MetFORMIN HCl [phone] MG TB24 Take 1 Can by mouth daily.  30 tablet  1  . glipiZIDE (GLUCOTROL) 10 MG tablet Take 1 tablet (10 mg total) by mouth 2 (two) times daily before a meal.  180 tablet  3  . HYDROcodone-acetaminophen (NORCO/VICODIN) 5-325 MG per tablet Take 1 tablet by mouth every 6 (six) hours as needed for  pain.  120 tablet  3  . metaxalone (SKELAXIN) 800 MG tablet Take 800 mg by mouth 3 (three) times daily.      . metFORMIN (GLUCOPHAGE) 1000 MG tablet Take 1 tablet (1,000 mg total) by mouth 2 (two) times daily with a meal.  180 tablet  3  . sitaGLIPtin (JANUVIA) 100 MG tablet Take 1 tablet (100 mg total) by mouth daily.  30 tablet  11  . traMADol (ULTRAM) 50 MG tablet Take 50 mg by mouth every 6 (six) hours as needed.         Orders Placed This Encounter  Procedures  . ANA w/Reflex if Positive  . Vitamin B12  . Basic metabolic panel  . TSH  . IFE, Urine (with Total Protein)  . Serum protein electrophoresis with reflex  . Rheumatoid factor    Return in about 3 months (around 12/15/2012).

## 2012-09-17 NOTE — Telephone Encounter (Signed)
Filled at pt appt.

## 2012-09-17 NOTE — Assessment & Plan Note (Addendum)
Multifactorial by recnet neurology evaluation,  Checking B12, ANA, SPEP/ IFE and  RA, etc by Neurology.  His pain is constant and uncontrolled on current medications including Lyrica and tizanidine,  Adding vicodin.  MRI ordered of lumbar spine by Neurology

## 2012-09-17 NOTE — Assessment & Plan Note (Signed)
Diabetes is uncontrolled.  Adding januvia 100 mg daily . samples given.  Vascular exam reassuring

## 2012-09-17 NOTE — Patient Instructions (Addendum)
You need to continue metformin 1000 mg twice daily and glipizide 10 mg twice daily with meals   have given you samples of januvia 100 mg once daily and after that you will need to fill the prescription  Please check your blood sugars twice daily , 1)  In the morning before you eat and 2) about 2 hours after any meal  If your blood sugars are still > 180 on this regimen we will need to start insulin  I have prescribed vicodin for your leg pain .  You can take it every six hours as needed

## 2012-09-18 LAB — BASIC METABOLIC PANEL
BUN: 15 mg/dL (ref 6–23)
CO2: 24 mEq/L (ref 19–32)
Chloride: 106 mEq/L (ref 96–112)
Creatinine, Ser: 1 mg/dL (ref 0.4–1.5)
Glucose, Bld: 264 mg/dL — ABNORMAL HIGH (ref 70–99)
Potassium: 4.3 mEq/L (ref 3.5–5.1)

## 2012-09-18 LAB — RHEUMATOID FACTOR: Rhuematoid fact SerPl-aCnc: <10 IU/mL (ref <=14)

## 2012-09-19 LAB — PROTEIN ELECTROPHORESIS, SERUM, WITH REFLEX
Alpha-1-Globulin: 3.7 % (ref 2.9–4.9)
Alpha-2-Globulin: 13.2 % — ABNORMAL HIGH (ref 7.1–11.8)
Beta Globulin: 7 % (ref 4.7–7.2)
Gamma Globulin: 11.1 % (ref 11.1–18.8)

## 2012-10-08 ENCOUNTER — Encounter: Payer: Self-pay | Admitting: Internal Medicine

## 2012-10-08 ENCOUNTER — Telehealth: Payer: Self-pay | Admitting: *Deleted

## 2012-10-08 NOTE — Telephone Encounter (Signed)
Patent notified as instructed by telephone. Was advised by patient that he is not seeing a doctor at the pain clinic because the worker's comp people will not let his. Advised patient that he needs to relay this information to the worker's comp case worker and see if she can get him back to the pain clinic.

## 2012-10-08 NOTE — Telephone Encounter (Signed)
Patient called stating that she is having a lot of foot pain. Patient states that the pain has gotten worse since starting physical therapy. Patient states that the worker's comp doctor doubled the directions on his Hydrocodone and told him to take 2 every 6 hours as needed. Patient wants to know if he should follow-up with you regarding his foot pain.

## 2012-10-08 NOTE — Telephone Encounter (Signed)
He told Dr Anne Hahn that his foot pain was improved with a steroid injection in his back in the past.  There is nothing more I can dos sinc ehis workmen's comp doctor increased his vicodin . So he will need to follow up with his pain clinic doctor to see about an injection to help

## 2012-10-22 ENCOUNTER — Encounter: Payer: Self-pay | Admitting: Emergency Medicine

## 2012-10-29 ENCOUNTER — Ambulatory Visit: Payer: Self-pay | Admitting: Vascular Surgery

## 2012-10-29 LAB — BASIC METABOLIC PANEL
Anion Gap: 7 (ref 7–16)
Calcium, Total: 8.9 mg/dL (ref 8.5–10.1)
Co2: 25 mmol/L (ref 21–32)
EGFR (African American): >60
EGFR (Non-African Amer.): >60
Osmolality: 278 (ref 275–301)
Potassium: 4.5 mmol/L (ref 3.5–5.1)
Sodium: 137 mmol/L (ref 136–145)

## 2012-11-16 ENCOUNTER — Telehealth: Payer: Self-pay | Admitting: General Practice

## 2012-11-16 NOTE — Telephone Encounter (Signed)
Pt called stating that his insurance was cancelled. He was wondering if he could get samples of his medications. Could not understand his phone message. If pt calls back ok to give samples of meds if we have them?

## 2012-11-16 NOTE — Telephone Encounter (Signed)
Yes we can give him samples of whatever we have that he takes.

## 2012-11-19 ENCOUNTER — Telehealth: Payer: Self-pay | Admitting: General Practice

## 2012-11-19 NOTE — Telephone Encounter (Signed)
Pt meds updated do to loss of insurance.

## 2012-11-19 NOTE — Telephone Encounter (Signed)
Pt called to notify him that samples were available at the front desk.

## 2012-12-19 ENCOUNTER — Other Ambulatory Visit: Payer: Self-pay | Admitting: Internal Medicine

## 2012-12-20 MED ORDER — HYDROCODONE-ACETAMINOPHEN 5-325 MG PO TABS: ORAL_TABLET | ORAL | Status: DC

## 2012-12-20 NOTE — Addendum Note (Signed)
Addended by: Sherlene Shams on: 12/20/2012 02:20 PM   Modules accepted: Orders

## 2012-12-20 NOTE — Telephone Encounter (Signed)
Please Advise

## 2012-12-20 NOTE — Telephone Encounter (Signed)
Ok to refill,  Authorized in epic  [id number] with refills

## 2012-12-21 NOTE — Telephone Encounter (Signed)
Script faxed.

## 2012-12-24 ENCOUNTER — Ambulatory Visit (INDEPENDENT_AMBULATORY_CARE_PROVIDER_SITE_OTHER): Payer: No Typology Code available for payment source | Admitting: Adult Health

## 2012-12-24 VITALS — BP 168/95 | HR 95 | Temp 98.2°F | Resp 16 | Wt 230.2 lb

## 2012-12-24 DIAGNOSIS — I2699 Other pulmonary embolism without acute cor pulmonale: Secondary | ICD-10-CM

## 2012-12-24 MED ORDER — TIZANIDINE HCL 4 MG PO CAPS: 4.0000 mg | ORAL_CAPSULE | Freq: Three times a day (TID) | ORAL | Status: DC | PRN

## 2012-12-25 ENCOUNTER — Telehealth: Payer: Self-pay | Admitting: *Deleted

## 2012-12-25 NOTE — Telephone Encounter (Signed)
Left message for pt to return my call regarding savings cards for Xarelto and Lyrica.

## 2012-12-26 ENCOUNTER — Encounter: Payer: Self-pay | Admitting: Adult Health

## 2012-12-26 NOTE — Assessment & Plan Note (Signed)
Xarelto Darden have a prescription discount plan. Will inquire from pharmaceutical whether there is any assistance for patients on this medication. Also gave patient samples.

## 2012-12-26 NOTE — Progress Notes (Signed)
Subjective:    Patient ID: Denard Tuminello Terriquez, male    DOB: 1950/11/22, 62 y.o.   MRN: [id number]  HPI  Patient is a pleasant 62 year old gentleman with history of coronary artery disease, peripheral vascular disease, hypertension, hyperlipidemia and history of PE who presents to clinic to discuss his medication. Patient has been started on Xarelto and is inquiring whether there is any other medication that Jolly be less expensive. He finds it very difficult to be able to continue to pay for this medication given his low monthly income on disability.   Current Outpatient Prescriptions on File Prior to Visit  Medication Sig Dispense Refill  . aspirin 81 MG tablet Take 81 mg by mouth daily.      . Cholecalciferol (VITAMIN D3) 2000 UNITS TABS Take by mouth.      Tery Sanfilippo Calcium (STOOL SOFTENER PO) Take by mouth.      . enalapril (VASOTEC) 2.5 MG tablet Take 2.5 mg by mouth daily. Please fill with the Generic.      [name] esomeprazole (NEXIUM) 40 MG capsule Take 1 capsule (40 mg total) by mouth daily.  30 capsule  3  . fish oil-omega-3 fatty acids 1000 MG capsule Take 2 g by mouth daily.      [name] glipiZIDE (GLUCOTROL) 10 MG tablet Take 1 tablet (10 mg total) by mouth 2 (two) times daily before a meal.  180 tablet  3  . glucose monitoring kit (FREESTYLE) monitoring kit 1 each by Does not apply route as needed for other. PATIENT TO CHOOSE THE GLUCOMETER OF HIS CHOICE  1 each  1  . HYDROcodone-acetaminophen (NORCO/VICODIN) 5-325 MG per tablet TAKE ONE TABLET BY MOUTH EVERY 6 HOURS AS NEEDED FOR PAIN *MAX OF FOUR TABLETS PER DAY*  120 tablet  2  . metFORMIN (GLUCOPHAGE) 1000 MG tablet Take 1,000 mg by mouth 2 (two) times daily with a meal. Please fill with the generic.      . metoprolol (LOPRESSOR) 100 MG tablet Take 100 mg by mouth 2 (two) times daily. Please fill with the generic.      . Multiple Vitamin (MULTIVITAMIN) tablet Take 1 tablet by mouth daily.      [name] omeprazole (PRILOSEC) 20 MG capsule Take 20 mg by  mouth daily. Please fill with the generic.      . pregabalin (LYRICA) 75 MG capsule Take 75 mg by mouth 2 (two) times daily. Given samples.      . Rivaroxaban (XARELTO) 20 MG TABS Take 20 mg by mouth daily. Given samples.      . rosuvastatin (CRESTOR) 20 MG tablet Take 20 mg by mouth daily.      . sitaGLIPtin (JANUVIA) 100 MG tablet Take 100 mg by mouth daily. Given samples.      . vitamin B-12 (CYANOCOBALAMIN) 1000 MCG tablet Take 1,000 mcg by mouth daily.      . cyclobenzaprine (AMRIX) 15 MG 24 hr capsule Take 15 mg by mouth daily as needed for muscle spasms.      . nortriptyline (PAMELOR) 25 MG capsule Take 25 mg by mouth at bedtime.      . traMADol (ULTRAM) 50 MG tablet Take 50 mg by mouth every 6 (six) hours as needed. Please dispense the generic.       No current facility-administered medications on file prior to visit.     Review of Systems  Constitutional: Negative.   Respiratory: Negative.   Cardiovascular: Positive for leg swelling.  Neurological: Negative for dizziness, light-headedness  and numbness.  Psychiatric/Behavioral: Negative.     BP 168/95  Pulse 95  Temp(Src) 98.2 F (36.8 C) (Oral)  Resp 16  Wt 230 lb 4 oz (104.441 kg)  BMI 35.02 kg/m2  SpO2 98%    Objective:   Physical Exam  Constitutional: He is oriented to person, place, and time.  Overweight male in no apparent distress  Cardiovascular: Normal rate, regular rhythm and normal heart sounds.   Pulmonary/Chest: Effort normal and breath sounds normal.  Neurological: He is alert and oriented to person, place, and time.  Skin: Skin is warm and dry. There is erythema.  Psychiatric: He has a normal mood and affect. His behavior is normal. Judgment and thought content normal.          Assessment & Plan:

## 2013-01-10 ENCOUNTER — Ambulatory Visit: Payer: Self-pay | Admitting: Neurology

## 2013-01-10 ENCOUNTER — Encounter: Payer: Self-pay | Admitting: Neurology

## 2013-01-10 ENCOUNTER — Telehealth: Payer: Self-pay | Admitting: *Deleted

## 2013-01-10 DIAGNOSIS — R6889 Other general symptoms and signs: Secondary | ICD-10-CM

## 2013-01-10 DIAGNOSIS — E1142 Type 2 diabetes mellitus with diabetic polyneuropathy: Secondary | ICD-10-CM

## 2013-01-10 DIAGNOSIS — D518 Other vitamin B12 deficiency anemias: Secondary | ICD-10-CM

## 2013-01-10 NOTE — Telephone Encounter (Signed)
Pt never returned previous phone call. Called and left message for pt, advising to call [phone] or visit [email or web address] for assistance in medication, savings card.

## 2013-01-10 NOTE — Telephone Encounter (Signed)
Yes, thank you.

## 2013-01-10 NOTE — Telephone Encounter (Signed)
Pharmacy called and stated patient says that we are working on helping him get Xarelto  To where he can afford medication pharmacy at [facility] and the NP there would like to start  patient on coumadin if not able to get Xarelto. Please advise.

## 2013-01-10 NOTE — Telephone Encounter (Signed)
Johnathan Hernandez, did you speak to this patient about a Xarelto savings card? See proro telephone note

## 2013-01-10 NOTE — Telephone Encounter (Signed)
Talked with [facility] at [facility] patient is there for inpatient stay at this time due to a Aortic Occlusion will be transferring to [facility] here in a couple of days, will be released on Xarelto patient stated he has 30 day supply at home. Please advise if I can start the process for patient to receive card

## 2013-02-11 ENCOUNTER — Telehealth: Payer: Self-pay | Admitting: *Deleted

## 2013-02-11 NOTE — Telephone Encounter (Signed)
Refill Request  Pravastatin 40 mg  #90  Take one tablet by mouth every day

## 2013-02-11 NOTE — Telephone Encounter (Signed)
Left message for patient to call office.  

## 2013-02-12 ENCOUNTER — Ambulatory Visit (INDEPENDENT_AMBULATORY_CARE_PROVIDER_SITE_OTHER): Payer: No Typology Code available for payment source | Admitting: Internal Medicine

## 2013-02-12 ENCOUNTER — Encounter: Payer: Self-pay | Admitting: Internal Medicine

## 2013-02-12 VITALS — BP 98/58 | HR 95 | Temp 98.3°F | Resp 14

## 2013-02-12 DIAGNOSIS — I70209 Unspecified atherosclerosis of native arteries of extremities, unspecified extremity: Secondary | ICD-10-CM

## 2013-02-12 DIAGNOSIS — E1159 Type 2 diabetes mellitus with other circulatory complications: Secondary | ICD-10-CM

## 2013-02-12 DIAGNOSIS — D518 Other vitamin B12 deficiency anemias: Secondary | ICD-10-CM

## 2013-02-12 DIAGNOSIS — E1151 Type 2 diabetes mellitus with diabetic peripheral angiopathy without gangrene: Secondary | ICD-10-CM

## 2013-02-12 DIAGNOSIS — IMO0002 Reserved for concepts with insufficient information to code with codable children: Secondary | ICD-10-CM

## 2013-02-12 DIAGNOSIS — E1142 Type 2 diabetes mellitus with diabetic polyneuropathy: Secondary | ICD-10-CM

## 2013-02-12 DIAGNOSIS — M5416 Radiculopathy, lumbar region: Secondary | ICD-10-CM

## 2013-02-12 DIAGNOSIS — I2699 Other pulmonary embolism without acute cor pulmonale: Secondary | ICD-10-CM

## 2013-02-12 DIAGNOSIS — I739 Peripheral vascular disease, unspecified: Secondary | ICD-10-CM

## 2013-02-12 MED ORDER — HYDROMORPHONE HCL 2 MG PO TABS: 1.0000 mg | ORAL_TABLET | ORAL | Status: DC | PRN

## 2013-02-12 NOTE — Patient Instructions (Addendum)
Stop the metoprolol for low blood pressure .,  Continue  The enalapril   I am refilling the dilaudid  to use every 4 hours as neede for your back and leg pain   You are already taking a muscle relaxer: The tizanidine is Zanaflex   Stop the Nucynta  And the vicodin.

## 2013-02-12 NOTE — Progress Notes (Addendum)
Patient ID: Johnathan Hernandez, male   DOB: 1951-04-15, 62 y.o.   MRN: [id number]    Patient Active Problem List   Diagnosis Date Noted  . Other general symptoms(780.99) 09/10/2012  . Other vitamin B12 deficiency anemia 09/10/2012  . Polyneuropathy in diabetes(357.2) 09/10/2012  . Radiculopathy of lumbar region 07/24/2012  . PE (pulmonary embolism) 07/24/2012  . Hypertension associated with diabetes 04/30/2012  . Vasomotor flushing 04/30/2012  . Hyperlipidemia LDL goal < 70 11/16/2011  . 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 Peripheral vascular disease   . History of tobacco abuse   . Coronary artery disease     Subjective:  CC:   Chief Complaint  Patient presents with  . Follow-up    hospital follow up surgery at [facility]    HPI:   Johnathan Hernandez a 62 y.o. male who presents for follow up after a recent hospitalization at [facility] for complications following vascular surgery .  He was discharged from rehab facility one week ago and has been having severe unrelenting pain in his right buttock with radiation to his right ankle since discharge.  Was receiving dilaudid while in house and dc papers suggest he did not receive a prescription for home use despite their intent to continue medication.  Despite using zanaflrex he feels that the buttock and thigh muscles are "in a knot" and is requesting a muscle relaxer .  He has had follow up with Pain clinic doctor which has not been effective in managing his pain He is miserable secondary to uncontrolled pain.   2) History of uncontrolled DM:  Since discharge to rehab, his bs have been 95 to 140 fasting , but not checking them daily since being home.  States that his   fastings are never above 140 and the lowest has been 95 .     Past Medical History  Diagnosis Date  . Diabetes mellitus   . Hypertension   . Hyperlipidemia   . Peripheral vascular disease Dec 2012    [facility], treated with extensive arterial bypass   . History of tobacco abuse     quit in 2006 after 5 vessel cabg  . Coronary artery disease     Past Surgical History  Procedure Laterality Date  . Coronary artery bypass graft  2006    [place]  . Aorta - iliac artery bypass graft  dec 2012    [facility]       The following portions of the patient's history were reviewed and updated as appropriate: Allergies, current medications, and problem list.    Review of Systems:   12 Pt  review of systems was negative except those addressed in the HPI,     History   Social History  . Marital Status: Married    Spouse Name: N/A    Number of Children: N/A  . Years of Education: N/A   Occupational History  . Not on file.   Social History Main Topics  . Smoking status: Former Smoker    Quit date: 11/13/2004  . Smokeless tobacco: Never Used  . Alcohol Use: No  . Drug Use: No  . Sexually Active: Not on file   Other Topics Concern  . Not on file   Social History Narrative  . No narrative on file    Objective:  BP 98/58  Pulse 95  Temp(Src) 98.3 F (36.8 C) (Oral)  Resp 14  SpO2 98%  General appearance: alert, cooperative and appears  stated age.  Appears to be in pain Ears: normal TM's and external ear canals both ears Throat: lips, mucosa, and tongue normal; teeth and gums normal Neck: no adenopathy, no carotid bruit, supple, symmetrical, trachea midline and thyroid not enlarged, symmetric, no tenderness/mass/nodules Back: symmetric, no curvature. ROM normal. No CVA tenderness. Lungs: clear to auscultation bilaterally Heart: regular rate and rhythm, S1, S2 normal, no murmur, click, rub or gallop Abdomen: soft, non-tender; bowel sounds normal; no masses,  no organomegaly Pulses: palpable,  1+   symmetric Skin: Skin color, texture, turgor normal. No rashes or lesions Lymph nodes: Cervical, supraclavicular, and axillary nodes normal.  Assessment and Plan:  PE (pulmonary embolism) Occurred in Nov 2013 as a  postoperative complication after a recurrent DVT, with no IVC filter placed for unclear reasons despite recurrent vasacular procedures. Managed with xarelto.   Polyneuropathy in diabetes(357.2) Continue lyrica.  Diabetes mellitus with atherosclerosis of arteries of extremities Better -controlled on current medications.  hemoglobin A1c is finally approaching  7.0 . He is up-to-date on eye exams and his foot exam is notable for decreased sensation but pulses have improved. l we'll repeat his urine microalbumin to creatinine ratio at next visit. He is on the appropriate medications.Reminder for annual diabetic eye exam given..  Foot exam done.   Radiculopathy of lumbar region Prescription written for dilaudid 2 mg , to use 1 mg every 4 hours prn since Dr Eduard Clos has not been able to provide adequate control of pain  with Nucynta and vicodin. He has multiple muscle relaxers listed,  Instructed to continue short acting zanaflex prn.  Muscles were not in spasm on today's exam.  Other vitamin B12 deficiency anemia Last B12 level was normal in Feb, continue oral supplements   Peripheral vascular disease Unclear what procedure was done for aortic occlusion at [facility] Records not available.  He has follow up with vascular surgery at [facility] instead of local follow up.  Continue xarelto, statin,  Asa.    Updated Medication List Outpatient Encounter Prescriptions as of 02/12/2013  Medication Sig Dispense Refill  . aspirin 81 MG tablet Take 81 mg by mouth daily.      . Cholecalciferol (VITAMIN D3) 2000 UNITS TABS Take by mouth.      Tery Sanfilippo Calcium (STOOL SOFTENER PO) Take by mouth.      . enalapril (VASOTEC) 2.5 MG tablet Take 2.5 mg by mouth daily. Please fill with the Generic.      . ferrous sulfate 325 (65 FE) MG tablet Take 325 mg by mouth daily with breakfast.      . fish oil-omega-3 fatty acids 1000 MG capsule Take 2 g by mouth daily.      [name] glipiZIDE (GLUCOTROL) 10 MG tablet Take 1 tablet (10 mg  total) by mouth 2 (two) times daily before a meal.  180 tablet  3  . glucose monitoring kit (FREESTYLE) monitoring kit 1 each by Does not apply route as needed for other. PATIENT TO CHOOSE THE GLUCOMETER OF HIS CHOICE  1 each  1  . HYDROcodone-acetaminophen (NORCO/VICODIN) 5-325 MG per tablet TAKE ONE TABLET BY MOUTH EVERY 6 HOURS AS NEEDED FOR PAIN *MAX OF FOUR TABLETS PER DAY*  120 tablet  2  . metFORMIN (GLUCOPHAGE) 1000 MG tablet Take 1,000 mg by mouth 2 (two) times daily with a meal. Please fill with the generic.      . metoprolol (LOPRESSOR) 100 MG tablet Take 100 mg by mouth 2 (two) times daily. Please fill with  the generic.      . metoprolol succinate (TOPROL-XL) 100 MG 24 hr tablet Take 100 mg by mouth daily.      [name] omeprazole (PRILOSEC) 20 MG capsule Take 20 mg by mouth daily. Please fill with the generic.      . pregabalin (LYRICA) 75 MG capsule Take 75 mg by mouth 2 (two) times daily. Given samples.      . Rivaroxaban (XARELTO) 20 MG TABS Take 20 mg by mouth daily. Given samples.      . rosuvastatin (CRESTOR) 20 MG tablet Take 20 mg by mouth daily.      . sitaGLIPtin (JANUVIA) 100 MG tablet Take 100 mg by mouth daily. Given samples.      [name] tiZANidine (ZANAFLEX) 4 MG capsule Take 1 capsule (4 mg total) by mouth 3 (three) times daily as needed for muscle spasms.  90 capsule  2  . vitamin B-12 (CYANOCOBALAMIN) 1000 MCG tablet Take 1,000 mcg by mouth daily.      [name] esomeprazole (NEXIUM) 40 MG capsule Take 1 capsule (40 mg total) by mouth daily.  30 capsule  3  . HYDROmorphone (DILAUDID) 2 MG tablet Take 0.5 tablets (1 mg total) by mouth every 4 (four) hours as needed for pain.  90 tablet  0  . Multiple Vitamin (MULTIVITAMIN) tablet Take 1 tablet by mouth daily.      . nortriptyline (PAMELOR) 25 MG capsule Take 25 mg by mouth at bedtime.      . traMADol (ULTRAM) 50 MG tablet Take 50 mg by mouth every 6 (six) hours as needed. Please dispense the generic.      . [DISCONTINUED] cyclobenzaprine  (AMRIX) 15 MG 24 hr capsule Take 15 mg by mouth daily as needed for muscle spasms.       No facility-administered encounter medications on file as of 02/12/2013.     Orders Placed This Encounter  Procedures  . Microalbumin / creatinine urine ratio  . Hemoglobin A1c  . LDL cholesterol, direct  . Comprehensive metabolic panel  . HM DIABETES FOOT EXAM    Return in about 4 weeks (around 03/12/2013).

## 2013-02-13 LAB — COMPREHENSIVE METABOLIC PANEL
ALT: 14 U/L (ref 0–53)
AST: 20 U/L (ref 0–37)
Albumin: 3.9 g/dL (ref 3.5–5.2)
BUN: 11 mg/dL (ref 6–23)
CO2: 22 mEq/L (ref 19–32)
Calcium: 10 mg/dL (ref 8.4–10.5)
Chloride: 105 mEq/L (ref 96–112)
Creatinine, Ser: 1.2 mg/dL (ref 0.4–1.5)
GFR: 64.24 mL/min (ref >60.00)
Potassium: 4.8 mEq/L (ref 3.5–5.1)

## 2013-02-13 LAB — LDL CHOLESTEROL, DIRECT: Direct LDL: 108.1 mg/dL

## 2013-02-14 NOTE — Assessment & Plan Note (Addendum)
Prescription written for dilaudid 2 mg , to use 1 mg every 4 hours prn since Dr Eduard Clos has not been able to provide adequate control of pain  with Nucynta and vicodin. He has multiple muscle relaxers listed,  Instructed to continue short acting zanaflex prn.  Muscles were not in spasm on today's exam.

## 2013-02-14 NOTE — Assessment & Plan Note (Signed)
Unclear what procedure was done for aortic occlusion at [facility] Records not available.  He has follow up with vascular surgery at [facility] instead of local follow up.  Continue xarelto, statin,  Asa.

## 2013-02-14 NOTE — Assessment & Plan Note (Signed)
Last B12 level was normal in Feb, continue oral supplements

## 2013-02-14 NOTE — Assessment & Plan Note (Signed)
Better -controlled on current medications.  hemoglobin A1c is finally approaching  7.0 . He is up-to-date on eye exams and his foot exam is notable for decreased sensation but pulses have improved. l we'll repeat his urine microalbumin to creatinine ratio at next visit. He is on the appropriate medications.Reminder for annual diabetic eye exam given..  Foot exam done.

## 2013-02-14 NOTE — Assessment & Plan Note (Addendum)
Occurred in Nov 2013 as a postoperative complication after a recurrent DVT, with no IVC filter placed for unclear reasons despite recurrent vasacular procedures. Managed with xarelto.

## 2013-02-14 NOTE — Assessment & Plan Note (Signed)
Continue lyrica 

## 2013-02-14 NOTE — Telephone Encounter (Signed)
Patient is on crestor.

## 2013-02-19 ENCOUNTER — Telehealth: Payer: Self-pay | Admitting: *Deleted

## 2013-02-19 MED ORDER — TIZANIDINE HCL 4 MG PO CAPS: 8.0000 mg | ORAL_CAPSULE | Freq: Three times a day (TID) | ORAL | Status: DC | PRN

## 2013-02-19 MED ORDER — HYDROCODONE-ACETAMINOPHEN 5-325 MG PO TABS: 2.0000 | ORAL_TABLET | Freq: Three times a day (TID) | ORAL | Status: DC | PRN

## 2013-02-19 MED ORDER — PRAVASTATIN SODIUM 80 MG PO TABS: 80.0000 mg | ORAL_TABLET | Freq: Every day | ORAL | Status: DC

## 2013-02-19 NOTE — Telephone Encounter (Signed)
He cannot have a refill on the vicodin yet because I he told me it was not working and I gave him the dilaudid at his last visit.  These are controlled substances and I cannot refill them early or change them unless he wants to bring back the unused dilaudid ,  Also  He can only have 6 vicodin daily because of the tylenol content,. There is no other form or hydrocodone available.   zanaflex #180 sent to pharmacy,  Pravastatin too

## 2013-02-19 NOTE — Telephone Encounter (Signed)
Patient called and stated the dilaudid is not working for his pain that the vicodin works and needs refill due to rehab facility increased dose by 2 tabs every 6 hours as needed for pain, also patient needs refill tizanidine for same reason taking double the dose on sig. Patient also request to to switch from crestor due to causing sweating and hot flashes to pravastatin please advise patient vicodin filled 12/20/12 120 with 2 refills, tizanidine filled at the same date.

## 2013-03-11 ENCOUNTER — Other Ambulatory Visit: Payer: Self-pay | Admitting: *Deleted

## 2013-03-11 NOTE — Telephone Encounter (Signed)
Patient states needs refill is patient to continue Xarelto samples given okay to refill script.

## 2013-03-12 NOTE — Telephone Encounter (Signed)
Is okay to fill?

## 2013-03-14 MED ORDER — RIVAROXABAN 20 MG PO TABS: 20.0000 mg | ORAL_TABLET | Freq: Every day | ORAL | Status: DC

## 2013-03-14 NOTE — Telephone Encounter (Signed)
Called patient left message he can pick up script.

## 2013-03-14 NOTE — Telephone Encounter (Signed)
Yes, he needs to stay on it .  Refill sent

## 2013-03-18 ENCOUNTER — Encounter: Payer: Self-pay | Admitting: Internal Medicine

## 2013-03-18 ENCOUNTER — Ambulatory Visit (INDEPENDENT_AMBULATORY_CARE_PROVIDER_SITE_OTHER): Payer: No Typology Code available for payment source | Admitting: Internal Medicine

## 2013-03-18 VITALS — BP 148/70 | HR 103 | Temp 98.2°F | Resp 14 | Wt 210.5 lb

## 2013-03-18 DIAGNOSIS — IMO0002 Reserved for concepts with insufficient information to code with codable children: Secondary | ICD-10-CM

## 2013-03-18 DIAGNOSIS — E1151 Type 2 diabetes mellitus with diabetic peripheral angiopathy without gangrene: Secondary | ICD-10-CM

## 2013-03-18 DIAGNOSIS — I739 Peripheral vascular disease, unspecified: Secondary | ICD-10-CM

## 2013-03-18 DIAGNOSIS — I2699 Other pulmonary embolism without acute cor pulmonale: Secondary | ICD-10-CM

## 2013-03-18 DIAGNOSIS — I70209 Unspecified atherosclerosis of native arteries of extremities, unspecified extremity: Secondary | ICD-10-CM

## 2013-03-18 DIAGNOSIS — E785 Hyperlipidemia, unspecified: Secondary | ICD-10-CM

## 2013-03-18 DIAGNOSIS — M5416 Radiculopathy, lumbar region: Secondary | ICD-10-CM

## 2013-03-18 DIAGNOSIS — E1159 Type 2 diabetes mellitus with other circulatory complications: Secondary | ICD-10-CM

## 2013-03-18 NOTE — Progress Notes (Signed)
Patient ID: Johnathan Hernandez, male   DOB: Barkow 24, 1952, 62 y.o.   MRN: [id number]   Follow up on diabetes ,  Chronic pain .,.  And   History of PE.   He is in constant pain,  Pain clininc doctor has been prescribing the dilaudid #180/month but insurance will only pay for 100 tablets.  He has not called Dr Craige Cotta.  MRI is going to be scheduled by Dr Craige Cotta, the pain clinic doctor  Has been taking xarelto since November at [facility].  Needs prior authorization

## 2013-03-18 NOTE — Patient Instructions (Addendum)
We will initiate the prior authorization for Xarelto.  Use the 15 mg samples and let us know if you are getting close to running out because we Hayse need to switch you to coumadin rather than run out completely.   You need to take Dulcolax or Ex Lax every night until you stop using the narcotics instead of Milk of magnesium  If you have any trouble getting the pain doctor to do the piror authorization for the hydromorphone ,  I will have to take over the prescribing of it

## 2013-03-18 NOTE — Progress Notes (Signed)
Patient ID: Johnathan Hernandez, male   DOB: [birth year], 62 y.o.   MRN: [id number]     Patient Active Problem List   Diagnosis Date Noted  . Other general symptoms(780.99) 09/10/2012  . Other vitamin B12 deficiency anemia 09/10/2012  . Polyneuropathy in diabetes(357.2) 09/10/2012  . Radiculopathy of lumbar region 07/24/2012  . PE (pulmonary embolism) 07/24/2012  . Hypertension associated with diabetes 04/30/2012  . Vasomotor flushing 04/30/2012  . Hyperlipidemia LDL goal < 70 11/16/2011  . 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 Peripheral vascular disease   . History of tobacco abuse   . Coronary artery disease     Subjective:  CC:   Chief Complaint  Patient presents with  . Follow-up    for 1 month , patient states needs prior autnorization for xarelto and li    HPI:   Johnathan Hernandez a 62 y.o. male who presents followup on chronic conditions. He continues to report severe back pain radiating to both legs which is minimally alleviated by use of Dilaudid  in maximal doses. He is now receiving Dilaudid  By Dr Lennie Hummer (sp?),  his pain clinic doctor. He has been prescribed 6 mg every 6 hours in 2 mg tablets but his pharmacy only gave him 100 tablets for the month and he is requesting additional medication from me. His is scheduled to have an MRI next week,  ordered by the pain clinic doctor. He he thinks it is of his lumbar spine..  he reports that He was offered admission for pain control but refused to be admitted.  Additionally he is unable to afford Xarelto, which was prescribed by the vascular surgeon at [facility] after he was admitted with a pulmonary embolism several months ago and is requesting samples. prior authorization has not been done yet.       Past Medical History  Diagnosis Date  . Diabetes mellitus   . Hypertension   . Hyperlipidemia   . Peripheral vascular disease Dec 2012    [facility], treated with extensive arterial bypass  . History  of tobacco abuse     quit in 2006 after 5 vessel cabg  . Coronary artery disease     Past Surgical History  Procedure Laterality Date  . Coronary artery bypass graft  2006    [place]  . Aorta - iliac artery bypass graft  dec 2012    [facility]       The following portions of the patient's history were reviewed and updated as appropriate: Allergies, current medications, and problem list.    Review of Systems:   12 Pt  review of systems was negative except those addressed in the HPI,     History   Social History  . Marital Status: Married    Spouse Name: N/A    Number of Children: N/A  . Years of Education: N/A   Occupational History  . Not on file.   Social History Main Topics  . Smoking status: Former Smoker    Quit date: 11/13/2004  . Smokeless tobacco: Never Used  . Alcohol Use: No  . Drug Use: No  . Sexually Active: Not on file   Other Topics Concern  . Not on file   Social History Narrative  . No narrative on file    Objective:  BP 148/70  Pulse 103  Temp(Src) 98.2 F (36.8 C) (Oral)  Resp 14  Wt 210 lb 8 oz (95.482 kg)  BMI 30.86 kg/m2  SpO2 98%  General appearance: alert, cooperative and appears stated age Ears: normal TM's and external ear canals both ears Throat: lips, mucosa, and tongue normal; teeth and gums normal Neck: no adenopathy, no carotid bruit, supple, symmetrical, trachea midline and thyroid not enlarged, symmetric, no tenderness/mass/nodules Back: symmetric, no curvature. ROM normal. No CVA tenderness. Lungs: clear to auscultation bilaterally Heart: regular rate and rhythm, S1, S2 normal, no murmur, click, rub or gallop Abdomen: soft, non-tender; bowel sounds normal; no masses,  no organomegaly Pulses: 2+ and symmetric Skin: Skin color, texture, turgor normal. No rashes or lesions Lymph nodes: Cervical, supraclavicular, and axillary nodes normal.  Assessment and Plan:  Diabetes mellitus with atherosclerosis of arteries of  extremities Improved control her last A1c of 7.2 in July. No changes today. He is on the appropriate medications.  Hyperlipidemia LDL goal < 70 Given his history of coronary artery disease and peripheral vascular disease he needs to be on a higher potency statin. We'll change pravastatin to atorvastatin with next refill.  PE (pulmonary embolism) We did not have samples of his anticoagulant in the 20 ose that he needs it we did have  15 mg Xarelto.  Samples given. Prior authorization will be done.  Peripheral vascular disease Continue aspirin, changed hypodensity statin, and Zarelto  Radiculopathy of lumbar region Patient is requiring moderately high doses of Dilaudid  which is now prescribed by his  pain clinic doctor. Additionally he is using Lyrica at 200 mg eery 8 hours.Maryclare Labrador need to find out why his Vicodin and zanaflex are being refilled when he states he's not taking them.   A total of 40 minutes was spent with patient more than half of which was spent in counseling, reviewing records from other prviders and coordination of care. Updated Medication List Outpatient Encounter Prescriptions as of 03/18/2013  Medication Sig Dispense Refill  . aspirin 81 MG tablet Take 81 mg by mouth daily.      . Cholecalciferol (VITAMIN D3) 2000 UNITS TABS Take by mouth.      Tery Sanfilippo Calcium (STOOL SOFTENER PO) Take by mouth.      . enalapril (VASOTEC) 2.5 MG tablet Take 2.5 mg by mouth daily. Please fill with the Generic.      [name] esomeprazole (NEXIUM) 40 MG capsule Take 1 capsule (40 mg total) by mouth daily.  30 capsule  3  . fish oil-omega-3 fatty acids 1000 MG capsule Take 2 g by mouth daily.      [name] glipiZIDE (GLUCOTROL) 10 MG tablet Take 1 tablet (10 mg total) by mouth 2 (two) times daily before a meal.  180 tablet  3  . glucose monitoring kit (FREESTYLE) monitoring kit 1 each by Does not apply route as needed for other. PATIENT TO CHOOSE THE GLUCOMETER OF HIS CHOICE  1 each  1  . metFORMIN  (GLUCOPHAGE) 1000 MG tablet Take 1,000 mg by mouth 2 (two) times daily with a meal. Please fill with the generic.      . Multiple Vitamin (MULTIVITAMIN) tablet Take 1 tablet by mouth daily.      [name] omeprazole (PRILOSEC) 20 MG capsule Take 20 mg by mouth daily. Please fill with the generic.      . pravastatin (PRAVACHOL) 80 MG tablet Take 1 tablet (80 mg total) by mouth daily.  90 tablet  3  . pregabalin (LYRICA) 75 MG capsule Take 200 mg by mouth 3 (three) times daily. Given samples.      . Rivaroxaban (XARELTO) 20 MG  TABS Take 1 tablet (20 mg total) by mouth daily. Given samples.  30 tablet  5  . sitaGLIPtin (JANUVIA) 100 MG tablet Take 100 mg by mouth daily. Given samples.      . vitamin B-12 (CYANOCOBALAMIN) 1000 MCG tablet Take 1,000 mcg by mouth daily.      [name] atorvastatin (LIPITOR) 20 MG tablet Take 1 tablet (20 mg total) by mouth daily.  90 tablet  3  . ferrous sulfate 325 (65 FE) MG tablet Take 325 mg by mouth daily with breakfast.      . [DISCONTINUED] HYDROcodone-acetaminophen (NORCO/VICODIN) 5-325 MG per tablet Take 2 tablets by mouth every 8 (eight) hours as needed for pain. Maximum 6 tablets daily  180 tablet  2  . [DISCONTINUED] metoprolol (LOPRESSOR) 100 MG tablet Take 100 mg by mouth 2 (two) times daily. Please fill with the generic.      . [DISCONTINUED] metoprolol succinate (TOPROL-XL) 100 MG 24 hr tablet Take 100 mg by mouth daily.      . [DISCONTINUED] nortriptyline (PAMELOR) 25 MG capsule Take 25 mg by mouth at bedtime.      . [DISCONTINUED] tiZANidine (ZANAFLEX) 4 MG capsule Take 2 capsules (8 mg total) by mouth 3 (three) times daily as needed for muscle spasms.  180 capsule  2  . [DISCONTINUED] traMADol (ULTRAM) 50 MG tablet Take 50 mg by mouth every 6 (six) hours as needed. Please dispense the generic.       No facility-administered encounter medications on file as of 03/18/2013.     No orders of the defined types were placed in this encounter.    No Follow-up on file.

## 2013-03-19 ENCOUNTER — Telehealth: Payer: Self-pay | Admitting: Internal Medicine

## 2013-03-19 DIAGNOSIS — E1169 Type 2 diabetes mellitus with other specified complication: Secondary | ICD-10-CM

## 2013-03-19 MED ORDER — ATORVASTATIN CALCIUM 20 MG PO TABS: 20.0000 mg | ORAL_TABLET | Freq: Every day | ORAL | Status: DC

## 2013-03-19 NOTE — Telephone Encounter (Signed)
We'll need to find out why his Vicodin and zanaflex are being refilled (per EPIC,   August 1) when he states he's not taking them.  I am also changing his cholesterol medication from pravastatin to generic Lipitor because he needs a more potent cholesterol medication   Please have him return on or after Oct 1 s for fasting blood work followed by an office visit.

## 2013-03-19 NOTE — Assessment & Plan Note (Signed)
We did not have samples of his anticoagulant in the 20 ose that he needs it we did have  15 mg Xarelto.  Samples given. Prior authorization will be done.

## 2013-03-19 NOTE — Telephone Encounter (Signed)
Left message for patient to return my call.

## 2013-03-19 NOTE — Assessment & Plan Note (Addendum)
Given his history of coronary artery disease and peripheral vascular disease he needs to be on a higher potency statin. We'll change pravastatin to atorvastatin with next refill.

## 2013-03-19 NOTE — Assessment & Plan Note (Signed)
Patient is requiring moderately high doses of Dilaudid  which is now prescribed by his  pain clinic doctor. Additionally he is using Lyrica at 200 mg eery 8 hours.Maryclare Labrador need to find out why his Vicodin and zanaflex are being refilled when he states he's not taking them.

## 2013-03-19 NOTE — Assessment & Plan Note (Signed)
Continue aspirin, changed hypodensity statin, and Zarelto

## 2013-03-19 NOTE — Assessment & Plan Note (Addendum)
Improved control her last A1c of 7.2 in July. No changes today. He is on the appropriate medications.

## 2013-03-20 NOTE — Telephone Encounter (Signed)
Pateint notified and appointments made for October.

## 2013-05-16 ENCOUNTER — Other Ambulatory Visit: Payer: Self-pay

## 2013-05-21 ENCOUNTER — Encounter: Payer: Self-pay | Admitting: Internal Medicine

## 2013-05-21 ENCOUNTER — Ambulatory Visit (INDEPENDENT_AMBULATORY_CARE_PROVIDER_SITE_OTHER): Payer: No Typology Code available for payment source | Admitting: Internal Medicine

## 2013-05-21 VITALS — BP 142/90 | HR 87 | Temp 98.3°F | Resp 14 | Wt 212.2 lb

## 2013-05-21 DIAGNOSIS — E1159 Type 2 diabetes mellitus with other circulatory complications: Secondary | ICD-10-CM

## 2013-05-21 DIAGNOSIS — M5416 Radiculopathy, lumbar region: Secondary | ICD-10-CM

## 2013-05-21 DIAGNOSIS — I70209 Unspecified atherosclerosis of native arteries of extremities, unspecified extremity: Secondary | ICD-10-CM

## 2013-05-21 DIAGNOSIS — E1142 Type 2 diabetes mellitus with diabetic polyneuropathy: Secondary | ICD-10-CM

## 2013-05-21 DIAGNOSIS — Z23 Encounter for immunization: Secondary | ICD-10-CM

## 2013-05-21 DIAGNOSIS — IMO0002 Reserved for concepts with insufficient information to code with codable children: Secondary | ICD-10-CM

## 2013-05-21 DIAGNOSIS — E1151 Type 2 diabetes mellitus with diabetic peripheral angiopathy without gangrene: Secondary | ICD-10-CM

## 2013-05-21 DIAGNOSIS — I739 Peripheral vascular disease, unspecified: Secondary | ICD-10-CM

## 2013-05-21 DIAGNOSIS — D509 Iron deficiency anemia, unspecified: Secondary | ICD-10-CM

## 2013-05-21 LAB — CBC WITH DIFFERENTIAL/PLATELET
Basophils Relative: 1.4 % (ref 0.0–3.0)
Eosinophils Absolute: 0.1 10*3/uL (ref 0.0–0.7)
Lymphs Abs: 2.2 10*3/uL (ref 0.7–4.0)
MCHC: 32.3 g/dL (ref 30.0–36.0)
MCV: 72.3 fl — ABNORMAL LOW (ref 78.0–100.0)
Monocytes Absolute: 0.9 10*3/uL (ref 0.1–1.0)
Neutrophils Relative %: 62.7 % (ref 43.0–77.0)
Platelets: 316 10*3/uL (ref 150.0–400.0)
RBC: 5.19 Mil/uL (ref 4.22–5.81)

## 2013-05-21 LAB — COMPREHENSIVE METABOLIC PANEL
Albumin: 4.3 g/dL (ref 3.5–5.2)
CO2: 25 mEq/L (ref 19–32)
GFR: 79.07 mL/min (ref >60.00)
Glucose, Bld: 189 mg/dL — ABNORMAL HIGH (ref 70–99)
Potassium: 4.6 mEq/L (ref 3.5–5.1)
Sodium: 140 mEq/L (ref 135–145)
Total Protein: 7.8 g/dL (ref 6.0–8.3)

## 2013-05-21 LAB — LIPID PANEL: Cholesterol: 157 mg/dL (ref 0–200)

## 2013-05-21 LAB — MICROALBUMIN / CREATININE URINE RATIO: Microalb, Ur: 1.7 mg/dL (ref 0.0–1.9)

## 2013-05-21 MED ORDER — SAXAGLIPTIN-METFORMIN ER 5-500 MG PO TB24: 1.0000 | ORAL_TABLET | Freq: Every day | ORAL | Status: DC

## 2013-05-21 MED ORDER — RIVAROXABAN 20 MG PO TABS: 20.0000 mg | ORAL_TABLET | Freq: Every day | ORAL | Status: DC

## 2013-05-21 NOTE — Progress Notes (Signed)
Patient ID: Johnathan Hernandez, male   DOB: 05/04/1951, 62 y.o.   MRN: [id number]   Patient Active Problem List   Diagnosis Date Noted  . Other general symptoms(780.99) 09/10/2012  . Other vitamin B12 deficiency anemia 09/10/2012  . Polyneuropathy in diabetes(357.2) 09/10/2012  . Radiculopathy of lumbar region 07/24/2012  . PE (pulmonary embolism) 07/24/2012  . Hypertension associated with diabetes 04/30/2012  . Vasomotor flushing 04/30/2012  . Hyperlipidemia LDL goal < 70 11/16/2011  . 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 Peripheral vascular disease   . History of tobacco abuse   . Coronary artery disease     Subjective:  CC:   Chief Complaint  Patient presents with  . Follow-up    labs    HPI:   Johnathan Hernandez a 62 y.o. male who presents Follow up on chronic conditions including DM type 2 with neuropathy, PAD with prior LE DVT/PE  and chronic back pain .  He continues to report severe back pain which  is now managed by the specialist with dilaudid and lyrica.  He had an MRI repeated at [facility] last week .   Regarding his dilaudid and lyrica, his insurance is not covering an adequate monthly amount (he has been prescribed #180,  But Insurance is  only covering #100) so his doctor is changing dose to 8 mg tid on the 13th.   He is rationing his dose until then) He reports that long term disability has finally been awarded but does  Not go into effect until March 2015.   He reports occasional constipation but taking a laxative and noticed no no blood in stools   DM : He ran out of Januvia samples 3 weeks ago, did not call for a prescription .  He is not checking sugars very often       Past Medical History  Diagnosis Date  . Diabetes mellitus   . Hypertension   . Hyperlipidemia   . Peripheral vascular disease Dec 2012    [facility], treated with extensive arterial bypass  . History of tobacco abuse     quit in 2006 after 5 vessel cabg  .  Coronary artery disease     Past Surgical History  Procedure Laterality Date  . Coronary artery bypass graft  2006    [place]  . Aorta - iliac artery bypass graft  dec 2012    [facility]       The following portions of the patient's history were reviewed and updated as appropriate: Allergies, current medications, and problem list.    Review of Systems:   12 Pt  review of systems was negative except those addressed in the HPI,     History   Social History  . Marital Status: Married    Spouse Name: N/A    Number of Children: N/A  . Years of Education: N/A   Occupational History  . Not on file.   Social History Main Topics  . Smoking status: Former Smoker    Quit date: 11/13/2004  . Smokeless tobacco: Never Used  . Alcohol Use: No  . Drug Use: No  . Sexual Activity: Not on file   Other Topics Concern  . Not on file   Social History Narrative  . No narrative on file    Objective:  Filed Vitals:   05/21/13 1400  BP: 142/90  Pulse: 87  Temp: 98.3 F (36.8 C)  Resp: 14     General  appearance: alert, cooperative and appears stated age Ears: normal TM's and external ear canals both ears Throat: lips, mucosa, and tongue normal; teeth and gums normal Neck: no adenopathy, no carotid bruit, supple, symmetrical, trachea midline and thyroid not enlarged, symmetric, no tenderness/mass/nodules Back: symmetric, no curvature. ROM normal. No CVA tenderness. Lungs: clear to auscultation bilaterally Heart: regular rate and rhythm, S1, S2 normal, no murmur, click, rub or gallop Abdomen: soft, non-tender; bowel sounds normal; no masses,  no organomegaly Pulses: 2+ right,  1+ on left Skin: Skin color, texture, turgor normal. No rashes or lesions Lymph nodes: Cervical, supraclavicular, and axillary nodes normal. Decreased sensation to light touch bilaterally   Assessment and Plan:  Diabetes mellitus with atherosclerosis of arteries of extremities Lab Results   Component Value Date   HGBA1C 8.6* 05/21/2013  He is following a low GI diet, but often skips meals.  His fasting sugars  Have been around 150  because he is out of Januvia.  Given his financial restrictions  Samples for two months of of kombglyze 5/500 was given   Peripheral vascular disease He remains on Xarelto 20 mg daily but is having trouble affording it .  Samples  given and copay card given.  He would not be a good candidate for coumadin unless he could use the home coumadin weekly testing, due to his distance from the office   Polyneuropathy in diabetes(357.2) Foot exam is unchanged,  No ulcers or skin breakdown and nails are in good shape.   Radiculopathy of lumbar region He has had an MRi of lumbar spine in the interim and was awarded disability per patient will not fo into effect until 2015. His physician is using dilaudid and lyrica   Updated Medication List Outpatient Encounter Prescriptions as of 05/21/2013  Medication Sig Dispense Refill  . aspirin 81 MG tablet Take 81 mg by mouth daily.      . Cholecalciferol (VITAMIN D3) 2000 UNITS TABS Take by mouth.      Tery Sanfilippo Calcium (STOOL SOFTENER PO) Take by mouth.      . enalapril (VASOTEC) 2.5 MG tablet Take 2.5 mg by mouth daily. Please fill with the Generic.      [name] esomeprazole (NEXIUM) 40 MG capsule Take 1 capsule (40 mg total) by mouth daily.  30 capsule  3  . fish oil-omega-3 fatty acids 1000 MG capsule Take 2 g by mouth daily.      [name] glipiZIDE (GLUCOTROL) 10 MG tablet Take 1 tablet (10 mg total) by mouth 2 (two) times daily before a meal.  180 tablet  3  . glucose monitoring kit (FREESTYLE) monitoring kit 1 each by Does not apply route as needed for other. PATIENT TO CHOOSE THE GLUCOMETER OF HIS CHOICE  1 each  1  . metFORMIN (GLUCOPHAGE) 1000 MG tablet Take 1,000 mg by mouth 2 (two) times daily with a meal. Please fill with the generic.      . Multiple Vitamin (MULTIVITAMIN) tablet Take 1 tablet by mouth daily.      [name]  omeprazole (PRILOSEC) 20 MG capsule Take 20 mg by mouth daily. Please fill with the generic.      . pravastatin (PRAVACHOL) 80 MG tablet Take 1 tablet (80 mg total) by mouth daily.  90 tablet  3  . pregabalin (LYRICA) 75 MG capsule Take 200 mg by mouth 3 (three) times daily. Given samples.      . Rivaroxaban (XARELTO) 20 MG TABS tablet Take 1 tablet (20 mg total)  by mouth daily. Given samples.  30 tablet  5  . sitaGLIPtin (JANUVIA) 100 MG tablet Take 100 mg by mouth daily. Given samples.      . vitamin B-12 (CYANOCOBALAMIN) 1000 MCG tablet Take 1,000 mcg by mouth daily.      . [DISCONTINUED] Rivaroxaban (XARELTO) 20 MG TABS Take 1 tablet (20 mg total) by mouth daily. Given samples.  30 tablet  5  . atorvastatin (LIPITOR) 20 MG tablet Take 1 tablet (20 mg total) by mouth daily.  90 tablet  3  . ferrous sulfate 325 (65 FE) MG tablet Take 325 mg by mouth daily with breakfast.      . HYDROmorphone (DILAUDID) 4 MG tablet Take 6 mg by mouth 4 (four) times daily.      . Saxagliptin-Metformin 5-500 MG TB24 Take 1 tablet by mouth daily.  30 tablet  5   No facility-administered encounter medications on file as of 05/21/2013.     Orders Placed This Encounter  Procedures  . Flu Vaccine QUAD 36+ mos PF IM (Fluarix)  . Lipid panel  . Hemoglobin A1c  . Comprehensive metabolic panel  . Microalbumin / creatinine urine ratio  . CBC with Differential  . HM DIABETES FOOT EXAM    Return in about 3 months (around 08/21/2013).

## 2013-05-21 NOTE — Patient Instructions (Addendum)
I have given you samples of Kombiglyze XR.  Take this once a day instead of metformin and Januvia.  DO NOT SKIP MEALS!  PEOPLE WITH DIABETES NEED TO EAT A MINIMUM OF 3 TIMES DAILY   Combine fruit with peanut butter or cheese for a healthy light meal or snack   I have given you a prescription for the Kombiglyze  it to see if your insurance will cover it

## 2013-05-21 NOTE — Assessment & Plan Note (Addendum)
He is following a low GI diet, but often skips meals.  His fasting sugars  Have been around 150  because he is out of Januvia.  Given his fnancila restrictions  Samples for two months of of kombglyze 5/500 was given

## 2013-05-22 ENCOUNTER — Encounter: Payer: Self-pay | Admitting: Internal Medicine

## 2013-05-22 LAB — HM DIABETES FOOT EXAM: HM Diabetic Foot Exam: DECREASED

## 2013-05-22 NOTE — Assessment & Plan Note (Signed)
He remains on Xarelto 2 mg daily but is having trouble affording it .  Samples  given and copay card given.  He would not be a good candidate for coumadin unless he could use the home coumadin weekly testing, due to his distance from the office

## 2013-05-22 NOTE — Assessment & Plan Note (Signed)
Foot exam is unchanged,  No ulcers or skin breakdown and nails are in good shape.

## 2013-05-22 NOTE — Assessment & Plan Note (Addendum)
He has had an MRi of lumbar spine in the interim and was awarded disability per patient will not fo into effect until 2015. His physician is using dilaudid and lyrica

## 2013-05-23 NOTE — Addendum Note (Signed)
Addended by: Sherlene Shams on: 05/23/2013 06:30 AM   Modules accepted: Orders

## 2013-05-27 ENCOUNTER — Ambulatory Visit: Payer: Self-pay | Admitting: Endocrinology

## 2013-05-27 DIAGNOSIS — Z0279 Encounter for issue of other medical certificate: Secondary | ICD-10-CM

## 2013-05-28 LAB — HM DIABETES EYE EXAM

## 2013-05-30 ENCOUNTER — Encounter: Payer: Self-pay | Admitting: *Deleted

## 2013-06-28 ENCOUNTER — Telehealth: Payer: Self-pay | Admitting: *Deleted

## 2013-06-28 MED ORDER — ENALAPRIL MALEATE 2.5 MG PO TABS: 2.5000 mg | ORAL_TABLET | Freq: Every day | ORAL | Status: DC

## 2013-06-28 NOTE — Telephone Encounter (Signed)
Medication refilled electronically enalapril.

## 2013-10-24 ENCOUNTER — Telehealth: Payer: Self-pay | Admitting: *Deleted

## 2013-10-24 NOTE — Telephone Encounter (Signed)
Placed Prior authorization form for the Xarelto in Dr.Tullo box

## 2013-11-01 ENCOUNTER — Encounter: Payer: Self-pay | Admitting: Internal Medicine

## 2013-11-01 ENCOUNTER — Ambulatory Visit (INDEPENDENT_AMBULATORY_CARE_PROVIDER_SITE_OTHER): Payer: Medicare Other | Admitting: Internal Medicine

## 2013-11-01 VITALS — BP 140/70 | HR 78 | Temp 98.5°F | Resp 18 | Wt 221.5 lb

## 2013-11-01 DIAGNOSIS — E1151 Type 2 diabetes mellitus with diabetic peripheral angiopathy without gangrene: Secondary | ICD-10-CM

## 2013-11-01 DIAGNOSIS — E1159 Type 2 diabetes mellitus with other circulatory complications: Secondary | ICD-10-CM

## 2013-11-01 DIAGNOSIS — I70209 Unspecified atherosclerosis of native arteries of extremities, unspecified extremity: Secondary | ICD-10-CM

## 2013-11-01 DIAGNOSIS — E785 Hyperlipidemia, unspecified: Secondary | ICD-10-CM

## 2013-11-01 DIAGNOSIS — I152 Hypertension secondary to endocrine disorders: Secondary | ICD-10-CM

## 2013-11-01 DIAGNOSIS — E1169 Type 2 diabetes mellitus with other specified complication: Secondary | ICD-10-CM

## 2013-11-01 DIAGNOSIS — I10 Essential (primary) hypertension: Secondary | ICD-10-CM

## 2013-11-01 NOTE — Progress Notes (Signed)
Patient ID: Johnathan Hernandez, male   DOB: 1951-01-04, 63 y.o.   MRN: [id number]  Patient Active Problem List   Diagnosis Date Noted  . Other general symptoms(780.99) 09/10/2012  . Other vitamin B12 deficiency anemia 09/10/2012  . Polyneuropathy in diabetes(357.2) 09/10/2012  . Radiculopathy of lumbar region 07/24/2012  . PE (pulmonary embolism) 07/24/2012  . Hypertension associated with diabetes 04/30/2012  . Vasomotor flushing 04/30/2012  . Hyperlipidemia LDL goal < 70 11/16/2011  . 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 Peripheral vascular disease   . History of tobacco abuse   . Coronary artery disease     Subjective:  CC:   Chief Complaint  Patient presents with  . Follow-up  . Diabetes    HPI:   Johnathan Hernandez is a 63 y.o. male who presents for Follow up on Type 2 DM, uncontrolled, with PAD.   Last visit October.  Lab Results  Component Value Date   HGBA1C 8.6* 05/21/2013   He was repeatedly asked to return sooner than a 3 month interval to discuss management but did not return our calls. He ran of out Janumet over a month ago and has not been able to refill it due to cost.   He is using glipizide and metformin and is reluctant to start insulin but will do so if necessary.  His fasting sugars have been 130 to 170    Saw vascular  Surgeon recently and his ABIs were reportedly normal.  Foot exam normal except for persistent decreased  sensation in left foot.   On gabapentin now,  1200 mg tid Makes him feel drowsy,  Insurance refused to pay for  lyrica which worked better.    Past Medical History  Diagnosis Date  . Diabetes mellitus   . Hypertension   . Hyperlipidemia   . Peripheral vascular disease Dec 2012    [facility], treated with extensive arterial bypass  . History of tobacco abuse     quit in 2006 after 5 vessel cabg  . Coronary artery disease     Past Surgical History  Procedure Laterality Date  . Coronary artery bypass  graft  2006    [place]  . Aorta - iliac artery bypass graft  dec 2012    [facility]       The following portions of the patient's history were reviewed and updated as appropriate: Allergies, current medications, and problem list.    Review of Systems:   Patient denies headache, fevers, malaise, unintentional weight loss, skin rash, eye pain, sinus congestion and sinus pain, sore throat, dysphagia,  hemoptysis , cough, dyspnea, wheezing, chest pain, palpitations, orthopnea, edema, abdominal pain, nausea, melena, diarrhea, constipation, flank pain, dysuria, hematuria, urinary  Frequency, nocturia, numbness, tingling, seizures,  Focal weakness, Loss of consciousness,  Tremor, insomnia, depression, anxiety, and suicidal ideation.     History   Social History  . Marital Status: Married    Spouse Name: N/A    Number of Children: N/A  . Years of Education: N/A   Occupational History  . Not on file.   Social History Main Topics  . Smoking status: Former Smoker    Quit date: 11/13/2004  . Smokeless tobacco: Never Used  . Alcohol Use: No  . Drug Use: No  . Sexual Activity: Not on file   Other Topics Concern  . Not on file   Social History Narrative  . No narrative on file    Objective:  Filed  Vitals:   11/01/13 1447  BP: 140/70  Pulse: 78  Temp: 98.5 F (36.9 C)  Resp: 18     General appearance: alert, cooperative and appears stated age Ears: normal TM's and external ear canals both ears Throat: lips, mucosa, and tongue normal; teeth and gums normal Neck: no adenopathy, no carotid bruit, supple, symmetrical, trachea midline and thyroid not enlarged, symmetric, no tenderness/mass/nodules Back: symmetric, no curvature. ROM normal. No CVA tenderness. Lungs: clear to auscultation bilaterally Heart: regular rate and rhythm, S1, S2 normal, no murmur, click, rub or gallop Abdomen: soft, non-tender; bowel sounds normal; no masses,  no organomegaly Pulses: 2+ and  symmetric Skin: Skin color, texture, turgor normal. No rashes or lesions Lymph nodes: Cervical, supraclavicular, and axillary nodes normal.  Assessment and Plan:  Diabetes mellitus with atherosclerosis of arteries of extremities Uncontrolled by last a1c secondary to inability to afford additional medication.  If A1c upon repeat is still > 8.0 we will start NPH insulin at bedtime for control of fasting sugars  Hypertension associated with diabetes Well controlled on current regimen. Renal function stable, no changes today.  Lab Results  Component Value Date   CREATININE 1.0 05/21/2013     Hyperlipidemia LDL goal < 70 Well controlled on current statin therapy.   Liver enzymes are normal , no changes today.  Lab Results  Component Value Date   CHOL 157 05/21/2013   HDL 44.90 05/21/2013   LDLCALC 90 05/21/2013   LDLDIRECT 108.1 02/12/2013   TRIG 109.0 05/21/2013   CHOLHDL 3 05/21/2013      Updated Medication List Outpatient Encounter Prescriptions as of 11/01/2013  Medication Sig  . aspirin 81 MG tablet Take 81 mg by mouth daily.  [name] atorvastatin (LIPITOR) 20 MG tablet Take 1 tablet (20 mg total) by mouth daily.  . Cholecalciferol (VITAMIN D3) 2000 UNITS TABS Take by mouth.  Mariane Baumgarten Calcium (STOOL SOFTENER PO) Take by mouth.  . enalapril (VASOTEC) 2.5 MG tablet Take 1 tablet (2.5 mg total) by mouth daily. Please fill with the Generic.  [name] esomeprazole (NEXIUM) 40 MG capsule Take 1 capsule (40 mg total) by mouth daily.  . ferrous sulfate 325 (65 FE) MG tablet Take 325 mg by mouth daily with breakfast.  . fish oil-omega-3 fatty acids 1000 MG capsule Take 2 g by mouth daily.  [name] gabapentin (NEURONTIN) 600 MG tablet Take 1,200 mg by mouth 3 (three) times daily.  [name] glipiZIDE (GLUCOTROL) 10 MG tablet Take 1 tablet (10 mg total) by mouth 2 (two) times daily before a meal.  . glucose monitoring kit (FREESTYLE) monitoring kit 1 each by Does not apply route as needed for other. PATIENT TO  CHOOSE THE GLUCOMETER OF HIS CHOICE  . HYDROmorphone (DILAUDID) 4 MG tablet Take 6 mg by mouth 4 (four) times daily.  . metFORMIN (GLUCOPHAGE) 1000 MG tablet Take 1,000 mg by mouth 2 (two) times daily with a meal. Please fill with the generic.  . Multiple Vitamin (MULTIVITAMIN) tablet Take 1 tablet by mouth daily.  [name] omeprazole (PRILOSEC) 20 MG capsule Take 20 mg by mouth daily. Please fill with the generic.  . pravastatin (PRAVACHOL) 80 MG tablet Take 1 tablet (80 mg total) by mouth daily.  . Rivaroxaban (XARELTO) 20 MG TABS tablet Take 1 tablet (20 mg total) by mouth daily. Given samples.  . Saxagliptin-Metformin 5-500 MG TB24 Take 1 tablet by mouth daily.  . sitaGLIPtin (JANUVIA) 100 MG tablet Take 100 mg by mouth daily. Given samples.  [name]  vitamin B-12 (CYANOCOBALAMIN) 1000 MCG tablet Take 1,000 mcg by mouth daily.  . pregabalin (LYRICA) 75 MG capsule Take 200 mg by mouth 3 (three) times daily. Given samples.     No orders of the defined types were placed in this encounter.    No Follow-up on file.

## 2013-11-01 NOTE — Progress Notes (Signed)
Pre-visit discussion using our clinic review tool. No additional management support is needed unless otherwise documented below in the visit note.  

## 2013-11-01 NOTE — Patient Instructions (Signed)
Your diabetes is under poor control on current regimen.      Please return next week for fasting labs.  Please return for an office visit  in 2 weeks  With a log of the last two weeks of blood sugars    I want you to check it twice a day,  At the following tines   Fasting  2 hour post prandials (after a meal) along with a brief description of the meal content related to the particular blood sugars.   DO NOT CHOOSE THE SAME MEAL EACH TIME

## 2013-11-03 ENCOUNTER — Encounter: Payer: Self-pay | Admitting: Internal Medicine

## 2013-11-03 NOTE — Assessment & Plan Note (Signed)
Well controlled on current regimen. Renal function stable, no changes today.  Lab Results  Component Value Date   CREATININE 1.0 05/21/2013

## 2013-11-03 NOTE — Assessment & Plan Note (Signed)
Uncontrolled by last a1c secondary to inability to afford additional medication.  If A1c upon repeat is still > 8.0 we will start NPH insulin at bedtime for control of fasting sugars

## 2013-11-03 NOTE — Assessment & Plan Note (Signed)
Well controlled on current statin therapy.   Liver enzymes are normal , no changes today.  Lab Results  Component Value Date   CHOL 157 05/21/2013   HDL 44.90 05/21/2013   LDLCALC 90 05/21/2013   LDLDIRECT 108.1 02/12/2013   TRIG 109.0 05/21/2013   CHOLHDL 3 05/21/2013

## 2013-11-04 ENCOUNTER — Telehealth: Payer: Self-pay | Admitting: *Deleted

## 2013-11-04 ENCOUNTER — Other Ambulatory Visit: Payer: Self-pay | Admitting: Internal Medicine

## 2013-11-04 ENCOUNTER — Telehealth: Payer: Self-pay | Admitting: Internal Medicine

## 2013-11-04 ENCOUNTER — Other Ambulatory Visit: Payer: Medicare Other

## 2013-11-04 DIAGNOSIS — Z7901 Long term (current) use of anticoagulants: Secondary | ICD-10-CM

## 2013-11-04 DIAGNOSIS — I2699 Other pulmonary embolism without acute cor pulmonale: Secondary | ICD-10-CM

## 2013-11-04 MED ORDER — RIVAROXABAN 20 MG PO TABS: 20.0000 mg | ORAL_TABLET | Freq: Every day | ORAL | Status: DC

## 2013-11-04 MED ORDER — WARFARIN SODIUM 5 MG PO TABS: 5.0000 mg | ORAL_TABLET | Freq: Every day | ORAL | Status: DC

## 2013-11-04 NOTE — Telephone Encounter (Signed)
What labs and dx?  

## 2013-11-04 NOTE — Telephone Encounter (Signed)
Patient stated he will try and stay on the Xarelto he does not want to try coumadin FYI

## 2013-11-04 NOTE — Telephone Encounter (Signed)
Relevant patient education mailed to patient.  

## 2013-11-04 NOTE — Assessment & Plan Note (Signed)
Patient can no longer afford xarelto 20 mg dose ,  No sampleS availablre  Will changemedicaitno to warfarin 5 mg daily starting tonight. Stop the xarelto in 2 days. And INR on Friday

## 2013-11-04 NOTE — Telephone Encounter (Signed)
That's OK!  Not for today !  For Friday first INR

## 2013-11-04 NOTE — Telephone Encounter (Signed)
Patient called also and wanted to know could he take the 15 Mg Xarelto since we do not have 20 MG patient cannot afford. See lab message below also.

## 2013-11-04 NOTE — Telephone Encounter (Signed)
Sorry I just collect the standards tube, i didn't collect for a pt/inr

## 2013-11-04 NOTE — Telephone Encounter (Signed)
No, it is weight based and will not  Work.,  We can switch him to coumadin whic his much less $$$ .  I will send the rx to his pharmacy to start tonight 5 mg daily and stop the xarelto in 2 days .  return for INR  On Friday

## 2013-11-07 ENCOUNTER — Other Ambulatory Visit: Payer: Self-pay | Admitting: *Deleted

## 2013-11-07 MED ORDER — RIVAROXABAN 20 MG PO TABS: 20.0000 mg | ORAL_TABLET | Freq: Every day | ORAL | Status: DC

## 2013-11-27 DIAGNOSIS — Z0279 Encounter for issue of other medical certificate: Secondary | ICD-10-CM

## 2013-12-06 ENCOUNTER — Other Ambulatory Visit: Payer: Self-pay | Admitting: Internal Medicine

## 2013-12-13 ENCOUNTER — Other Ambulatory Visit: Payer: Self-pay | Admitting: *Deleted

## 2013-12-13 MED ORDER — METFORMIN HCL 1000 MG PO TABS: 1000.0000 mg | ORAL_TABLET | Freq: Two times a day (BID) | ORAL | Status: DC

## 2014-01-30 ENCOUNTER — Telehealth: Payer: Self-pay | Admitting: *Deleted

## 2014-01-30 NOTE — Telephone Encounter (Signed)
[facility] requesting that the pt d/c his Xarelto 20mg  for 5 days so that an Epidural made be performed.  Requesting response be faxed to [phone] attn:  Renee.  Please advise

## 2014-01-30 NOTE — Telephone Encounter (Signed)
Letter drafted and should have printed.  

## 2014-01-31 NOTE — Telephone Encounter (Signed)
Letter faxed, message left on Renee's VM of same.

## 2014-02-05 ENCOUNTER — Other Ambulatory Visit: Payer: Self-pay | Admitting: Internal Medicine

## 2014-02-07 ENCOUNTER — Other Ambulatory Visit: Payer: Self-pay | Admitting: Internal Medicine

## 2014-02-28 ENCOUNTER — Telehealth: Payer: Self-pay | Admitting: *Deleted

## 2014-02-28 DIAGNOSIS — E119 Type 2 diabetes mellitus without complications: Secondary | ICD-10-CM

## 2014-02-28 NOTE — Telephone Encounter (Signed)
Chart reviewed for diabetic bundle. Pt notified of need for appt, scheduled 03/28/14. Pt also needs fasting labs. Left message to call back and schedule a lab appt prior to his visit. What labs would you like?

## 2014-02-28 NOTE — Telephone Encounter (Signed)
Orders are in   Thanks

## 2014-03-02 ENCOUNTER — Other Ambulatory Visit: Payer: Self-pay | Admitting: Internal Medicine

## 2014-03-11 ENCOUNTER — Telehealth: Payer: Self-pay | Admitting: *Deleted

## 2014-03-11 NOTE — Telephone Encounter (Signed)
Pt called states he is to have a nerve block on Friday by Dr Annette StableWunnava.  Requesting pt be off Xarelto from 7.28.15 through 7.31.15.  Please advise

## 2014-03-11 NOTE — Telephone Encounter (Signed)
Left detailed message on pts VM advising of MDs message 

## 2014-03-11 NOTE — Telephone Encounter (Signed)
Agree, he needs to sto the Xarelto last dose 7/27

## 2014-03-28 ENCOUNTER — Ambulatory Visit (INDEPENDENT_AMBULATORY_CARE_PROVIDER_SITE_OTHER): Payer: Medicare Other | Admitting: Internal Medicine

## 2014-03-28 ENCOUNTER — Encounter: Payer: Self-pay | Admitting: Internal Medicine

## 2014-03-28 VITALS — BP 126/74 | HR 75 | Temp 98.7°F | Resp 16 | Ht 69.25 in | Wt 221.0 lb

## 2014-03-28 DIAGNOSIS — I10 Essential (primary) hypertension: Secondary | ICD-10-CM

## 2014-03-28 DIAGNOSIS — E1159 Type 2 diabetes mellitus with other circulatory complications: Secondary | ICD-10-CM

## 2014-03-28 DIAGNOSIS — E1169 Type 2 diabetes mellitus with other specified complication: Secondary | ICD-10-CM

## 2014-03-28 DIAGNOSIS — E1165 Type 2 diabetes mellitus with hyperglycemia: Principal | ICD-10-CM

## 2014-03-28 DIAGNOSIS — I70209 Unspecified atherosclerosis of native arteries of extremities, unspecified extremity: Secondary | ICD-10-CM

## 2014-03-28 DIAGNOSIS — E785 Hyperlipidemia, unspecified: Secondary | ICD-10-CM

## 2014-03-28 DIAGNOSIS — E1151 Type 2 diabetes mellitus with diabetic peripheral angiopathy without gangrene: Secondary | ICD-10-CM

## 2014-03-28 DIAGNOSIS — E1129 Type 2 diabetes mellitus with other diabetic kidney complication: Secondary | ICD-10-CM

## 2014-03-28 DIAGNOSIS — Z1211 Encounter for screening for malignant neoplasm of colon: Secondary | ICD-10-CM

## 2014-03-28 LAB — HM DIABETES FOOT EXAM: HM Diabetic Foot Exam: DECREASED

## 2014-03-28 NOTE — Patient Instructions (Signed)
We are lowering your evening dose of glipizide to 5 mg  Unless you are having dessert /ice cream   Try [employer] flat bread  And pita bread   Instead of whole wheat just for a week !!  Lower carb, lower cal   We are checking your sugar today

## 2014-03-28 NOTE — Progress Notes (Signed)
Pre-visit discussion using our clinic review tool. No additional management support is needed unless otherwise documented below in the visit note.  

## 2014-03-29 LAB — COMPREHENSIVE METABOLIC PANEL
ALK PHOS: 32 U/L — AB (ref 39–117)
ALT: 29 U/L (ref 0–53)
AST: 25 U/L (ref 0–37)
Albumin: 4.6 g/dL (ref 3.5–5.2)
BILIRUBIN TOTAL: 0.2 mg/dL (ref 0.2–1.2)
BUN: 13 mg/dL (ref 6–23)
CO2: 25 mEq/L (ref 19–32)
Calcium: 9.9 mg/dL (ref 8.4–10.5)
Chloride: 104 mEq/L (ref 96–112)
Creat: 1 mg/dL (ref 0.50–1.35)
Glucose, Bld: 103 mg/dL — ABNORMAL HIGH (ref 70–99)
POTASSIUM: 5.2 meq/L (ref 3.5–5.3)
Sodium: 140 mEq/L (ref 135–145)
TOTAL PROTEIN: 7.1 g/dL (ref 6.0–8.3)

## 2014-03-30 ENCOUNTER — Encounter: Payer: Self-pay | Admitting: Internal Medicine

## 2014-03-30 NOTE — Progress Notes (Signed)
Patient ID: Johnathan Hernandez, male   DOB: Mar 25, 1951, 63 y.o.   MRN: [id number]   Patient Active Problem List   Diagnosis Date Noted  . Other general symptoms(780.99) 09/10/2012  . Other vitamin B12 deficiency anemia 09/10/2012  . Polyneuropathy in diabetes(357.2) 09/10/2012  . Radiculopathy of lumbar region 07/24/2012  . PE (pulmonary embolism) 07/24/2012  . Hypertension associated with diabetes 04/30/2012  . Vasomotor flushing 04/30/2012  . Hyperlipidemia with target LDL less than 70 11/16/2011  . 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 Peripheral vascular disease   . History of tobacco abuse   . Coronary artery disease     Subjective:  CC:   Chief Complaint  Patient presents with  . Follow-up  . Diabetes    HPI:   Johnathan Hernandez is a 63 y.o. male who presents for Follow up on chronic conditions, including Type 2 DM,  PAD, hypertension m CAD and history of PE .  He has no complaints today.  Has been taking his medications as directed and checking his sugars twice daily.  The last week or so have been elevated but not above 200.  He has been having frequent hypoglycemic events in the evening.     Past Medical History  Diagnosis Date  . Diabetes mellitus   . Hypertension   . Hyperlipidemia   . Peripheral vascular disease Dec 2012    [facility], treated with extensive arterial bypass  . History of tobacco abuse     quit in 2006 after 5 vessel cabg  . Coronary artery disease     Past Surgical History  Procedure Laterality Date  . Coronary artery bypass graft  2006    [facility]  . Aorta - iliac artery bypass graft  dec 2012    [facility]       The following portions of the patient's history were reviewed and updated as appropriate: Allergies, current medications, and problem list.    Review of Systems:   Patient denies headache, fevers, malaise, unintentional weight loss, skin rash, eye pain, sinus congestion and sinus pain, sore throat,  dysphagia,  hemoptysis , cough, dyspnea, wheezing, chest pain, palpitations, orthopnea, edema, abdominal pain, nausea, melena, diarrhea, constipation, flank pain, dysuria, hematuria, urinary  Frequency, nocturia, numbness, tingling, seizures,  Focal weakness, Loss of consciousness,  Tremor, insomnia, depression, anxiety, and suicidal ideation.     History   Social History  . Marital Status: Married    Spouse Name: N/A    Number of Children: N/A  . Years of Education: N/A   Occupational History  . Not on file.   Social History Main Topics  . Smoking status: Former Smoker    Quit date: 11/13/2004  . Smokeless tobacco: Never Used  . Alcohol Use: No  . Drug Use: No  . Sexual Activity: Not on file   Other Topics Concern  . Not on file   Social History Narrative  . No narrative on file    Objective:  Filed Vitals:   03/28/14 1612  BP: 126/74  Pulse: 75  Temp: 98.7 F (37.1 C)  Resp: 16     General appearance: alert, cooperative and appears stated age Ears: normal TM's and external ear canals both ears Throat: lips, mucosa, and tongue normal; teeth and gums normal Neck: no adenopathy, no carotid bruit, supple, symmetrical, trachea midline and thyroid not enlarged, symmetric, no tenderness/mass/nodules Back: symmetric, no curvature. ROM normal. No CVA tenderness. Lungs: clear to auscultation bilaterally  Heart: regular rate and rhythm, S1, S2 normal, no murmur, click, rub or gallop Abdomen: soft, non-tender; bowel sounds normal; no masses,  no organomegaly Pulses: 2+ and symmetric Skin: Skin color, texture, turgor normal. No rashes or lesions Lymph nodes: Cervical, supraclavicular, and axillary nodes normal.  Assessment and Plan:  Diabetes mellitus with atherosclerosis of arteries of extremities Improved but having recurrent evening  hypoglycemia. Reducing evening dose to 5 mg of glipizide. He will return for a1c    Hypertension associated with diabetes Well  controlled on current regimen. Renal function stable, no changes today.  Lab Results  Component Value Date   CREATININE 1.00 03/28/2014   Lab Results  Component Value Date   NA 140 03/28/2014   K 5.2 03/28/2014   CL 104 03/28/2014   CO2 25 03/28/2014     Hyperlipidemia with target LDL less than 34 Well controlled on current statin therapy.   Liver enzymes and fasting lipids are due  , no changes today.  Lab Results  Component Value Date   CHOL 157 05/21/2013   HDL 44.90 05/21/2013   LDLCALC 90 05/21/2013   LDLDIRECT 108.1 02/12/2013   TRIG 109.0 05/21/2013   CHOLHDL 3 05/21/2013        Updated Medication List Outpatient Encounter Prescriptions as of 03/28/2014  Medication Sig  . aspirin 81 MG tablet Take 81 mg by mouth daily.  . Cholecalciferol (VITAMIN D3) 2000 UNITS TABS Take by mouth.  Mariane Baumgarten Calcium (STOOL SOFTENER PO) Take by mouth.  . enalapril (VASOTEC) 2.5 MG tablet TAKE ONE TABLET BY MOUTH ONCE DAILY  . enalapril (VASOTEC) 2.5 MG tablet TAKE ONE TABLET BY MOUTH ONCE DAILY  . esomeprazole (NEXIUM) 40 MG capsule Take 1 capsule (40 mg total) by mouth daily.  . ferrous sulfate 325 (65 FE) MG tablet Take 325 mg by mouth daily with breakfast.  . fish oil-omega-3 fatty acids 1000 MG capsule Take 2 g by mouth daily.  [name] gabapentin (NEURONTIN) 600 MG tablet Take 1,200 mg by mouth 3 (three) times daily.  [name] glipiZIDE (GLUCOTROL) 10 MG tablet TAKE ONE TABLET BY MOUTH TWICE DAILY BEFORE A MEAL  . glucose monitoring kit (FREESTYLE) monitoring kit 1 each by Does not apply route as needed for other. PATIENT TO CHOOSE THE GLUCOMETER OF HIS CHOICE  . HYDROmorphone (DILAUDID) 4 MG tablet Take 6 mg by mouth 4 (four) times daily.  . metFORMIN (GLUCOPHAGE) 1000 MG tablet Take 1 tablet (1,000 mg total) by mouth 2 (two) times daily with a meal.  . Multiple Vitamin (MULTIVITAMIN) tablet Take 1 tablet by mouth daily.  [name] omeprazole (PRILOSEC) 20 MG capsule Take 20 mg by mouth daily. Please fill  with the generic.  . pravastatin (PRAVACHOL) 80 MG tablet TAKE ONE TABLET BY MOUTH ONCE DAILY  . pregabalin (LYRICA) 75 MG capsule Take 200 mg by mouth 3 (three) times daily. Given samples.  . Rivaroxaban (XARELTO) 20 MG TABS tablet Take 1 tablet (20 mg total) by mouth daily with supper.  . vitamin B-12 (CYANOCOBALAMIN) 1000 MCG tablet Take 1,000 mcg by mouth daily.  . [DISCONTINUED] atorvastatin (LIPITOR) 20 MG tablet Take 1 tablet (20 mg total) by mouth daily.

## 2014-03-30 NOTE — Assessment & Plan Note (Signed)
Well controlled on current regimen. Renal function stable, no changes today.  Lab Results  Component Value Date   CREATININE 1.00 03/28/2014   Lab Results  Component Value Date   NA 140 03/28/2014   K 5.2 03/28/2014   CL 104 03/28/2014   CO2 25 03/28/2014

## 2014-03-30 NOTE — Assessment & Plan Note (Signed)
Well controlled on current statin therapy.   Liver enzymes and fasting lipids are due  , no changes today.  Lab Results  Component Value Date   CHOL 157 05/21/2013   HDL 44.90 05/21/2013   LDLCALC 90 05/21/2013   LDLDIRECT 108.1 02/12/2013   TRIG 109.0 05/21/2013   CHOLHDL 3 05/21/2013

## 2014-03-30 NOTE — Assessment & Plan Note (Signed)
Improved but having recurrent evening  hypoglycemia. Reducing evening dose to 5 mg of glipizide. He will return for a1c

## 2014-04-01 LAB — HEMOGLOBIN A1C

## 2014-04-02 NOTE — Addendum Note (Signed)
Addended by: Sherlene ShamsULLO, Timea Breed L on: 04/02/2014 12:53 PM   Modules accepted: Orders

## 2014-04-11 ENCOUNTER — Other Ambulatory Visit: Payer: Self-pay | Admitting: Internal Medicine

## 2014-05-01 ENCOUNTER — Encounter: Payer: Self-pay | Admitting: General Surgery

## 2014-05-04 ENCOUNTER — Other Ambulatory Visit: Payer: Self-pay | Admitting: Internal Medicine

## 2014-05-15 IMAGING — US US EXTREM LOW VENOUS BILAT
1 series · 14 of 24 positions shown · non-contrast
Comparison: none

REASON FOR EXAM: left calf pain
COMMENTS:

[Series 1: us extrem low venous bilat · 0.12mm/px · 14 of 45 slices shown]
[im 1/45]
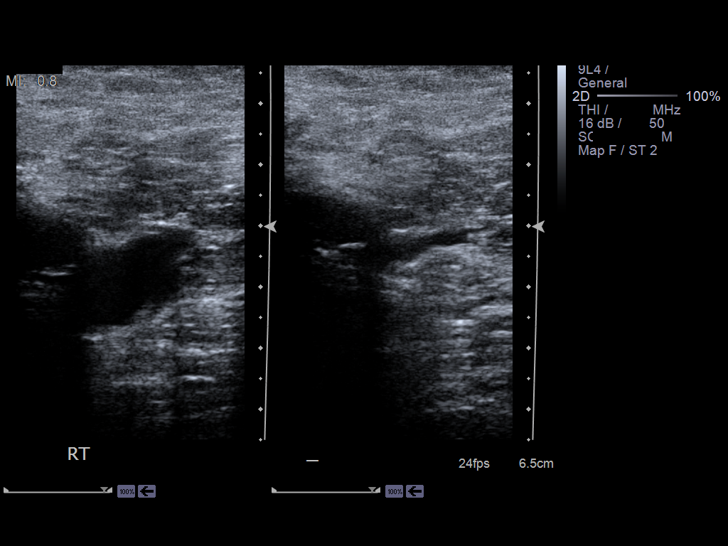
[im 4/45]
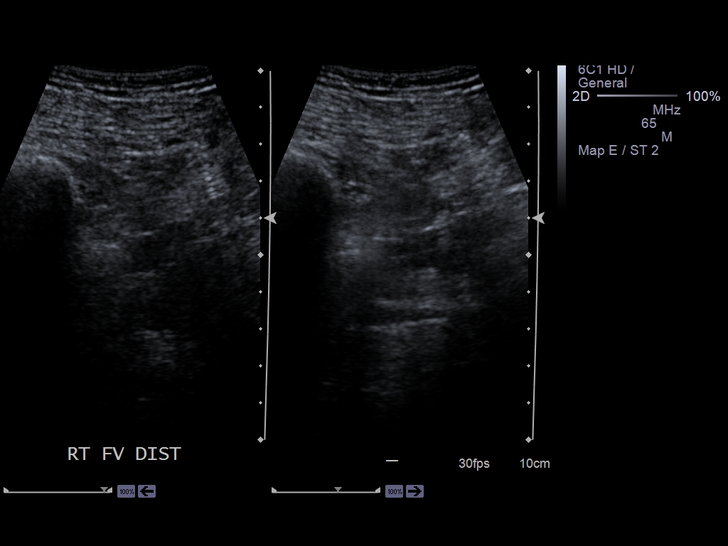
[im 8/45]
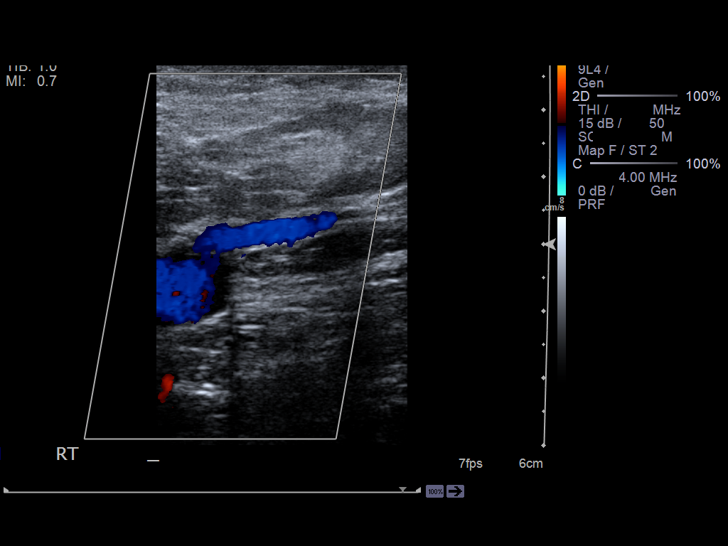
[im 12/45]
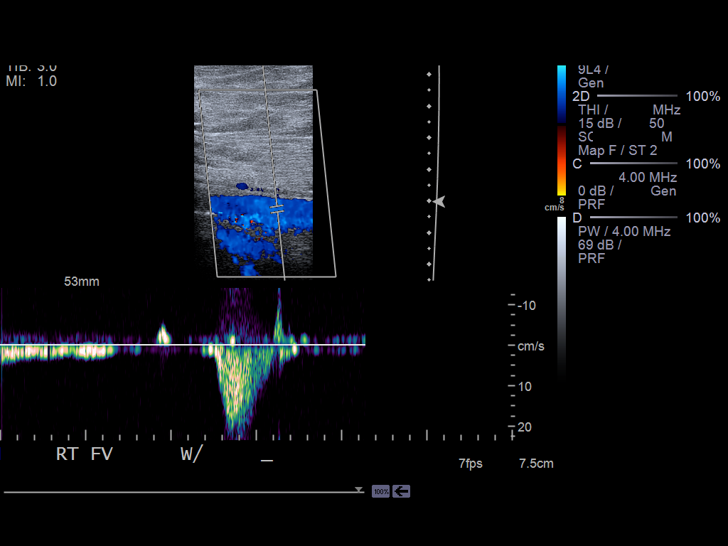
[im 14/45]
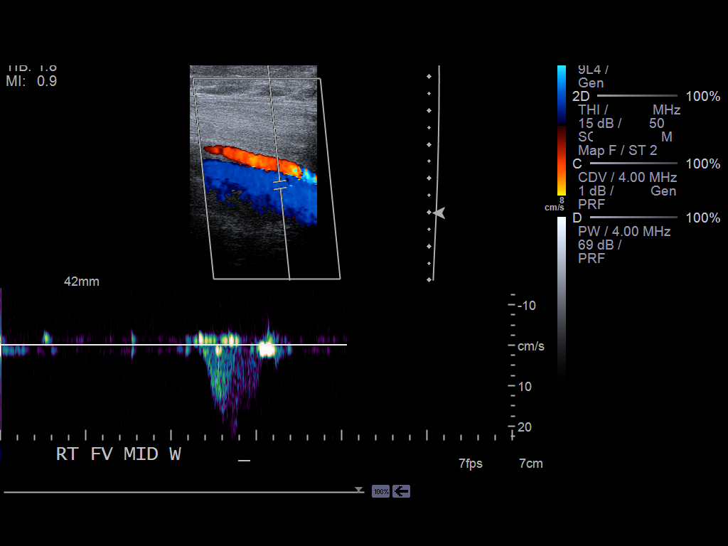
[im 18/45]
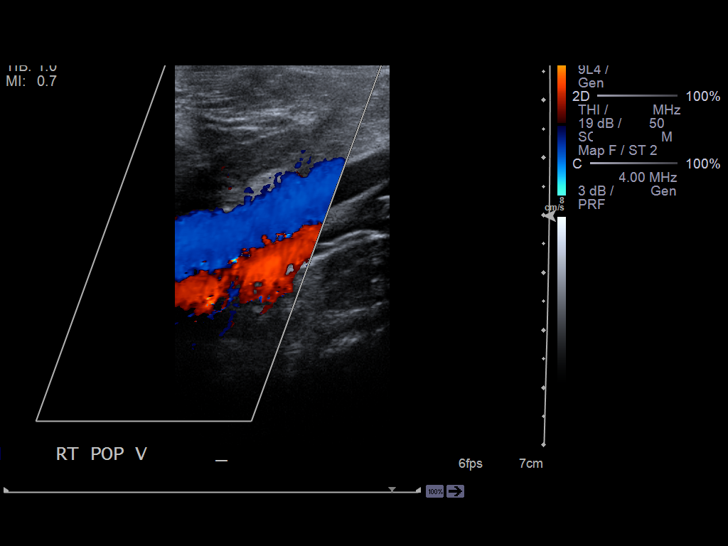
[im 22/45]
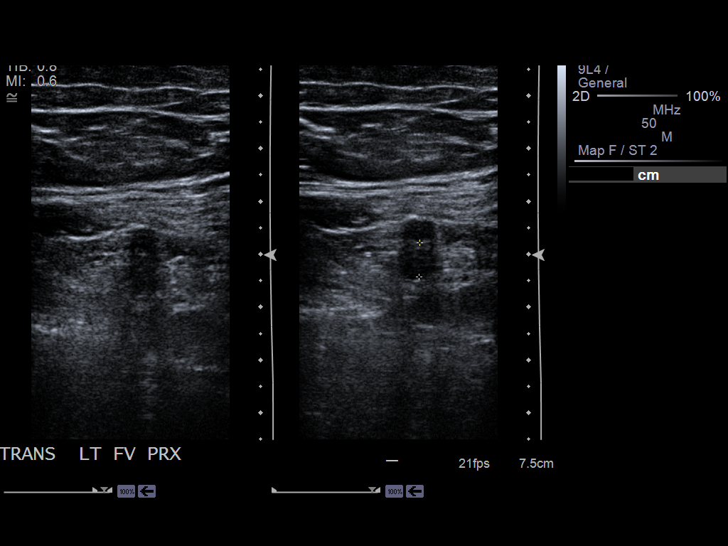
[im 23/45]
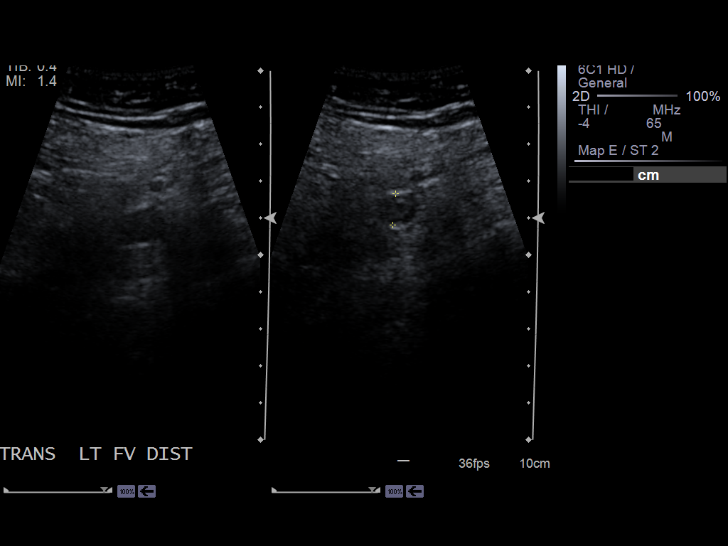
[im 27/45]
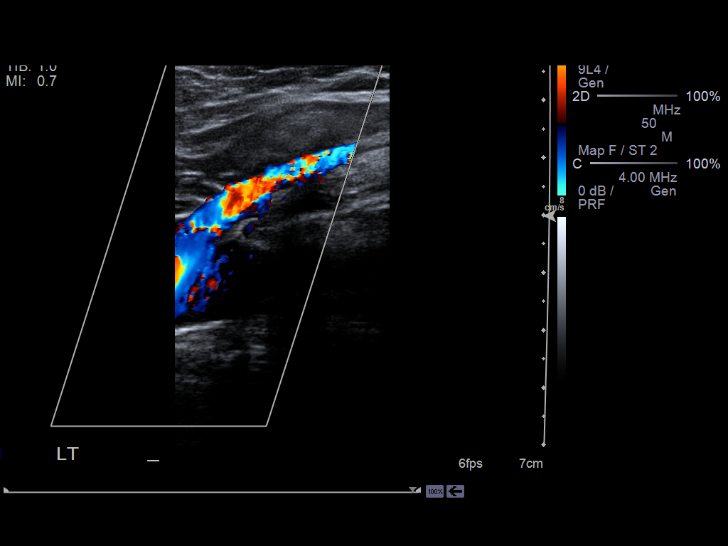
[im 31/45]
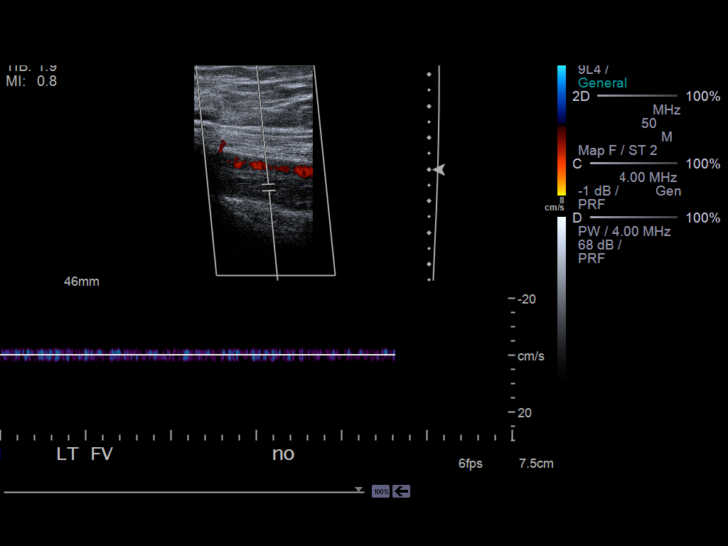
[im 35/45]
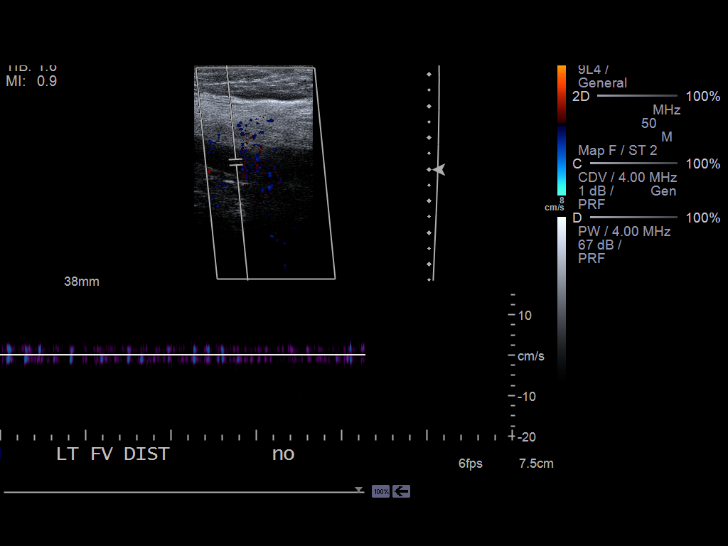
[im 37/45]
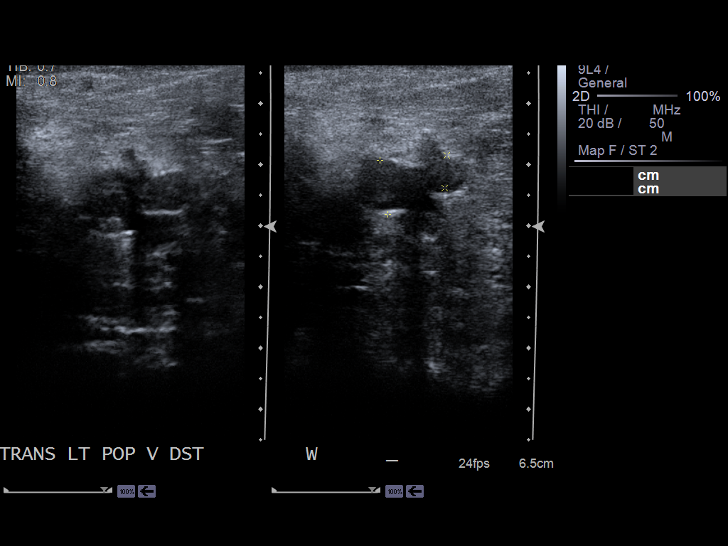
[im 41/45]
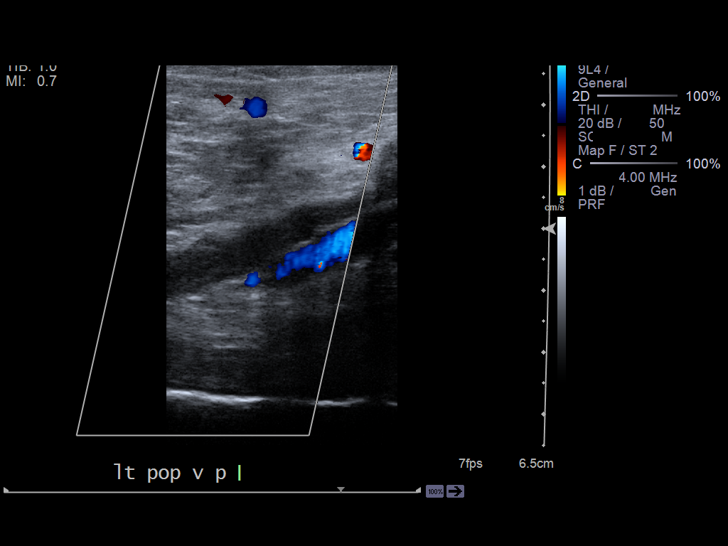
[im 45/45]
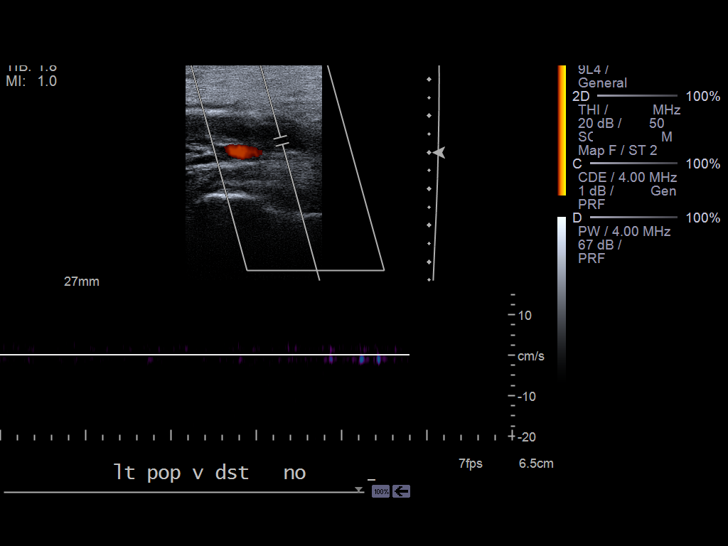

[14 of 24 positions shown; findings below may reference images not displayed]

PROCEDURE:     US  - US DOPPLER LOW EXTR BILATERAL  - June 12, 2012  [DATE]

RESULT:     Doppler interrogation of the deep venous system of both lower
extremities is performed. The study shows occlusive thrombus in the left
popliteal vein and superficial femoral vein with normal compressibility and
color and spectral Doppler appearance in the common femoral vein. The right
lower extremity demonstrates normal compressibility and color and spectral
Doppler appearance.
IMPRESSION: Positive study with occlusive thrombus in the left
superficial femoral vein and popliteal vein. Findings were called to the
requesting physician at the time of completion of the study.

[REDACTED](*)

## 2014-05-16 ENCOUNTER — Other Ambulatory Visit: Payer: Self-pay | Admitting: Internal Medicine

## 2014-05-19 ENCOUNTER — Ambulatory Visit: Payer: Self-pay | Admitting: General Surgery

## 2014-05-21 ENCOUNTER — Encounter: Payer: Self-pay | Admitting: *Deleted

## 2014-05-25 ENCOUNTER — Other Ambulatory Visit: Payer: Self-pay | Admitting: Internal Medicine

## 2014-05-27 ENCOUNTER — Other Ambulatory Visit: Payer: Self-pay | Admitting: Internal Medicine

## 2014-07-25 ENCOUNTER — Other Ambulatory Visit: Payer: Self-pay | Admitting: Internal Medicine

## 2014-07-30 ENCOUNTER — Telehealth: Payer: Self-pay | Admitting: Internal Medicine

## 2014-07-30 ENCOUNTER — Other Ambulatory Visit: Payer: Self-pay | Admitting: Internal Medicine

## 2014-07-30 NOTE — Telephone Encounter (Signed)
Called patient, no answer, VM not set up. Mailed letter with information.

## 2014-07-30 NOTE — Telephone Encounter (Signed)
Diabetic supplies form received.  patient has not had an A1c in over a year.  please call patient and set up fasting lab apt ASAP followed by OV in January.  I thought we were calling  The diabtic patients and getting those lost to follow up caught up?

## 2014-12-02 NOTE — Discharge Summary (Signed)
PATIENT NAME:  Johnathan GrandchildMAY, Johnathan L MR#:  [id number] DATE OF BIRTH:  1951-02-25  DATE OF ADMISSION:  06/12/2012 DATE OF DISCHARGE:  06/14/2012  PRIMARY CARE PHYSICIAN: Dr. Veneda MelterMaureen Andreassi   DISCHARGE DIAGNOSES:  1. Bilateral pulmonary embolus with hypoxic respiratory failure.  2. Deep vein thrombosis of the left leg.  3. Hypertension.  4. Diabetes mellitus, type 2.  5. Peripheral arterial disease with stenting.  6. Diet-controlled diabetes.   MEDICATIONS:  1. Colace 100 mg p.o. b.i.d. p.r.n.  2. Omeprazole 20 mg p.o. daily. 3. Tramadol 50 mg q.4 hours p.r.n.  4. Skelaxin 800 mg p.o. b.i.d.  5. Lasix 800 mg p.o. t.i.d.  6. Nucynta 75 mg p.o. every six hours p.r.n. for pain. 7. Enalapril 2.5 mg p.o. daily.  8. Metoprolol 50 mg p.o. b.i.d.  9. Aspirin 81 mg p.o. daily.  10. Xarelto 20 mg p.o. daily in the evening. Patient advised to take 15 mg daily for three weeks then followed by 20 mg daily. Patient was given prescription for Xarelto 15 mg p.o. b.i.d. for three weeks then 20 mg daily after that. 11. He also got prescription for prednisone tapering course 40 mg daily for two days, 30 mg p.o. daily for two days, 20 mg per two days, and 10 mg daily for two days and then stop.   OXYGEN: Patient is discharged home with home oxygen 1 liter.   DIET: Low sodium, ADA diet. Diet consistency regular.   FOLLOW UP: Follow up with Dr. Festus BarrenJason Dew in 1 to 2 weeks and also follow up with Dr. Duncan Dulleresa Tullo in 1 to 2 weeks.   CONSULTATION: Vascular surgery consult with Dr. Festus BarrenJason Dew.    HOSPITAL COURSE: 64 year old male came in because of leg pain, cramping and also shortness of breath. Look at the history and physical for full details. Deep vein thrombosis of the leg showed positive for occlusive thrombus in the left superficial vein and popliteal vein. CT chest showing bilateral distal right and left pulmonary artery emboli. White count was 14.4 on admission. BUN 16, creatinine 1.4, troponin less than  0.02. He is admitted for bilateral pulmonary embolus with deep venous thrombosis. Patient started on Lovenox and later on changed to Xarelto. Seen by Dr. Meredeth IdeFleming and also Dr. Festus BarrenJason Dew. Patient not a candidate for IVC filter because he was clinically this morning well so Dr. Festus BarrenJason Dew said patient can be followed up as an outpatient. No indication for filter during this hospital stay.   Patient did have hypoxia on ambulation. Saturations dropped to 88%. Patient was discharged to home with 1 liter by nasal cannula because sats were around 87% on ambulation on room air so he is discharged home with 1 liter of nasal cannula all the time.   Patient's other diagnoses include diabetes mellitus, type 2. He is advised continue diet. Patient sugars are also elevated up to 300s likely secondary to steroids. His hemoglobin A1c also is elevated at 9.3 so he should be on medications for diabetes and patient can have metformin. We can call it into his pharmacy.   Hypertension, stable. Echocardiogram showed normal ejection fraction more than 55% and chronic obstructive pulmonary disease flare. Had some wheezing and white count elevation on admission, started on steroids and discharged home with tapering course of prednisone. Patient has no wheezing when he left the hospital . LABORATORY, DIAGNOSTIC AND RADIOLOGICAL DATA: D-dimer more than 6. CT chest showed distal bilateral pulmonary emboli extending predominantly into the lower lobes. Ultrasound of extremities  shows positive study for thrombus in the left superficial femoral and popliteal veins. Echocardiogram showed ejection fraction more than 55%.   TIME SPENT ON DISCHARGE PREPARATION: More than 30 minutes.  ____________________________ Katha Hamming, MD sk:cms D: 06/16/2012 22:37:22 ET T: 06/18/2012 10:35:51 ET  JOB#: [id number] cc: Katha Hamming, MD, <Dictator> Maureen P. Alison Murray, MD Katha Hamming MD ELECTRONICALLY SIGNED 06/26/2012 19:43

## 2014-12-02 NOTE — H&P (Signed)
PATIENT NAME:  Johnathan, Hernandez MR#:  [id number] DATE OF BIRTH:  1951/07/31  DATE OF ADMISSION:  06/12/2012  PRESENTING COMPLAINT: Left leg pain and cramping for two weeks and shortness of breath for two days.   HISTORY OF PRESENT ILLNESS: Johnathan Hernandez is a 64 year old morbidly obese Caucasian gentleman with history of hypertension, type 2 diabetes, peripheral vascular disease comes to the Emergency Room from [employer] where he started having nausea and had an episode of vomiting, became very diaphoretic and started having shortness of breath with some chest tightness. EMS was called. Patient was brought to the Emergency Room where work-up showed he has bilateral pulmonary embolus and left lower extremity deep vein thrombosis in the superficial femoral and popliteal vein. Patient received a dose of Lovenox by ER physician. He is being admitted for further evaluation and management. During my evaluation patient is mildly tachycardic with heart rate 100 to 102 regular, sats are 98% on 2 liters and blood pressure is stable. Patient reports having left leg pain, cramping in his calf and ankle pain for last two weeks, which caused him to be bedbound and very minimal amount of walking at home. He then started noticing shortness of breath for last two days and came to the Emergency Room.   PAST MEDICAL HISTORY:  1. Migraine headaches.  2. Glaucoma.  3. Type 2 diabetes.  4. Hiatal hernia.  5. Coronary artery disease status post coronary artery bypass graft.  6. Peripheral vascular disease.  7. Gastroesophageal reflux disease.  8. Patient has stents in the right leg x1 and left leg x2.  9. Right knee surgery.  10. Right carotid endarterectomy.  11. Coronary artery bypass graft in the past.   MEDICATIONS:  1. Multivitamin.  2. Statins. 3. Aspirin.  4. Enalapril.  5. Metformin.  6. Glipizide.  7. Metoprolol.  8. Nucynta.  9. Tramadol.  10. Skelaxin. Dosage not known. Patient's family member is  going to bring medications in the morning.   ALLERGIES: Percocet and Vicodin causes hallucination.   FAMILY HISTORY: Positive for peripheral vascular disease and chronic obstructive pulmonary disease in father.    REVIEW OF SYSTEMS: CONSTITUTIONAL: No fever. Positive for fatigue, weakness, and leg pain. EYES: No blurred or double vision. Positive for glaucoma. ENT: No tinnitus, ear pain, hearing loss or epistaxis. RESPIRATORY: Positive for chronic obstructive pulmonary disease. CARDIOVASCULAR: No chest pain. Positive for shortness of breath on exertion, palpitations and near syncopal spell. GASTROINTESTINAL: Positive for nausea and vomiting. No abdominal pain. Positive for gastroesophageal reflux disease. GENITOURINARY: No dysuria, hematuria, or frequency. ENDOCRINE: No polyuria, nocturia, thyroid problems. HEMATOLOGY: No anemia or easy bruising. SKIN: No acne, rash. MUSCULOSKELETAL: Positive for pain in the left leg calf. NEUROLOGIC: No cerebrovascular accident or transient ischemic attack. Positive for neuropathy lower extremity and chronic back pain. PSYCH: No anxiety or depression. All other systems reviewed and negative.   PHYSICAL EXAMINATION:  GENERAL: Patient is awake, alert, oriented x3, not in acute distress. Patient is morbidly obese, not in respiratory distress.   VITAL SIGNS: Afebrile, pulse 102 to 104 tachycardic, blood pressure 122/82, sats 98% on 2 liters.   HEENT: Atraumatic, normocephalic. Pupils are equal, round, and reactive to light and accommodation. Extraocular movements intact. Oral mucosa is moist.   NECK: Supple. No JVD. No carotid bruit.   RESPIRATORY: Clear to auscultation bilaterally. No rales, rhonchi, respiratory distress, or labored breathing.   CARDIOVASCULAR: Tachycardia present. Both the heart sounds are normal. No murmur heard. PMI not lateralized. Chest  nontender.   EXTREMITIES: Good pedal pulses, good femoral pulses. No lower extremity edema. Patient does  have left calf tenderness. Positive for Homans sign.  ABDOMEN: Soft, benign, nontender. No organomegaly. Positive bowel sounds.   NEUROLOGICAL: Grossly intact cranial nerves II through XII. Patient does have peripheral neuropathy in both his lower extremities. Motor exam appears intact.   SKIN: Warm and dry.   LABORATORY, DIAGNOSTIC AND RADIOLOGICAL DATA: EKG shows sinus tachycardia with left axis deviation and incomplete right bundle branch block. Ultrasound of the Doppler lower extremity shows positive study with occluded thrombus in the left superficial vein and popliteal vein. CT chest showed significant bilateral distal right and left pulmonary artery emboli extending predominantly in the left lower lobe pulmonary arteries but also to less prominent degree in bilaterally upper lobe and right middle lobe pulmonary arteries. White count 14.4, hemoglobin and hematocrit 15.5 and 45.7, platelet count 169, glucose 261, BUN 16, creatinine 1.44, sodium 138, potassium 4.5, chloride 104, bicarbonate 23, calcium 9.2. LFTs within normal limits. D-dimer is more than 6. Troponin is less than 0.02.   ASSESSMENT AND PLAN: 64 year old Johnathan Hernandez with history of hypertension, type 2 diabetes, and peripheral arterial disease comes in with: 1. Bilateral pulmonary embolus. Patient presented with left calf leg and ankle cramping for two weeks, bedbound, short of breath for two days and presented with nausea, vomiting, and diaphoresis with tachycardia. CT of the chest done showed bilateral PE in the distal pulmonary arteries with positive ultrasound Doppler of the lower extremities consistent with deep vein thrombosis of the left lower extremity. Patient received Lovenox in the ER. Will continue Lovenox 1 mg/kg b.i.d. Will consider Coumadin versus Xarelto for further treatment. Will also have pulmonary consultation with Dr. Meredeth IdeFleming, case was discussed with him. Check an echocardiogram to look for RV strain and vascular  consultation for lower extremity deep vein thrombosis.  2. Left superficial femoral vein and popliteal vein deep vein thrombosis. Patient already on Lovenox. Will have vascular consultation in the morning to see if patient is a candidate for thrombolysis.   3. Hypertension. Will continue his home medications, enalapril and metoprolol, once we determine the dosage when family brings in medications.  4. Type 2 diabetes. Continue sliding scale for now. Once metformin and glipizide dosage confirmed will continue home medications.  5. History of peripheral arterial disease with stents in both the lower extremities, bilateral lower extremities in the past.   6. Morbid obesity.  7. Hyperlipidemia. Patient is already on statins, will resume once we get the dosage in the morning from either family brining in pill bottles or from patient's pharmacy.  8. Deep vein thrombosis prophylaxis. Patient is already on treatment dose with Lovenox.  9. Further work-up according to patient's clinical course. Hospital admission plan was discussed with patient and patient's wife who is present in the Emergency Room.   CRITICAL TIME SPENT: 50 minutes. Case was also discussed with Dr. Meredeth IdeFleming.  ____________________________ Wylie HailSona A. Allena KatzPatel, MD sap:cms D: 06/12/2012 21:43:26 ET T: 06/13/2012 06:09:54 ET JOB#: [id number]  cc: Bharath Bernstein A. Allena KatzPatel, MD, <Dictator> Duncan Dulleresa Tullo, MD Herbon E. Meredeth IdeFleming, MD Willow OraSONA A Lareina Espino MD ELECTRONICALLY SIGNED 06/16/2012 12:51

## 2014-12-02 NOTE — Consult Note (Signed)
Asked to see patient regarding LLE DVT and PE.been having left leg pain and swelling for about 2 weeks now.  and chest pain prompted hospital arrival.significant PE but has actually weaned down to RA with sats of 95% currently.  DVT still present.  not need PE lysis as stable.leg swelling mild and symptoms for a t least two weeks so do not think LLE lysis would be of great benefitrole for IVC filter.  Would not place at this time unless develops bleeding or issues with anticoagulation.be happy to see in the office in follow up if desired.  Should wear compression stockings and elevate legs to limit post-phlebitic symptoms.    Electronic Signatures: Annice Needyew, Spirit Wernli S (MD)  (Signed on 30-Oct-13 14:29)  Authored  Last Updated: 30-Oct-13 14:29 by Annice Needyew, Creed Kail S (MD)

## 2014-12-05 NOTE — Op Note (Signed)
PATIENT NAME:  Johnathan GrandchildMAY, Johnathan L MR#:  [id number] DATE OF BIRTH:  [birth year]  DATE OF PROCEDURE:  10/29/2012  PREOPERATIVE DIAGNOSES: 1.  Peripheral artery disease with rest pain, left lower extremity.  2.  Status post multiple previous interventions for peripheral vascular disease.  3.  Carotid stenosis status post previous endarterectomy and stent placement.  4.  Diabetes.  5.  Coronary artery disease.   POSTOPERATIVE DIAGNOSES:  1.  Peripheral artery disease with rest pain, left lower extremity.  2.  Status post multiple previous interventions for peripheral vascular disease.  3.  Carotid stenosis status post previous endarterectomy and stent placement.  4.  Diabetes.  5.  Coronary artery disease.   PROCEDURES PERFORMED: 1.  Ultrasound guidance for vascular access, right common femoral artery.  2.  Microcatheter placement into right common femoral artery.  3.  Angiogram of right femoral and iliac system.   SURGEON: Annice NeedyJason S. Danessa Mensch, M.D.   ANESTHESIA: Local with moderate conscious sedation.   ESTIMATED BLOOD LOSS: Minimal.   FLUOROSCOPY TIME: Approximately 1 minute.   CONTRAST USED: 15 mL.   INDICATION FOR PROCEDURE: This is a gentleman with extensive peripheral vascular disease. He has had multiple previous interventions performed here by a surgeon no longer in the medical community.  [facility] an aortobifemoral bypass was performed in the last couple of years. He returns with recurrent left lower extremity ischemic rest pain symptoms. He is brought in for an angiogram for further evaluation to see if potential further attempts at revascularization are possible. Risks and benefits were discussed. Informed consent was obtained.   DESCRIPTION OF PROCEDURE: The patient is brought to the vascular and interventional radiology suite. Groins were shaved and prepped and a sterile surgical field was created. Due to the pulseless nature of the groins,  ultrasound was used to access the right common femoral artery. This was done with a needle under direct ultrasound guidance and a permanent image was recorded. A micropuncture wire was placed, which was clearly traversing into the native iliac system and a micropuncture sheath was then placed. Two images were performed. There was occlusion of the right iliac artery, no filling of any right limb of the aortobifemoral bypass was seen, and clinically this was occluded as well. It was clear we were not going to have percutaneous revascularization options today and so I quickly elected to terminate the procedure. The sheath was removed, pressure was held and sterile dressing was placed. The patient tolerated the procedure well and was taken to the recovery room in stable condition.  ____________________________ Annice NeedyJason S. Olivia Pavelko, MD jsd:sb D: 10/29/2012 08:55:09 ET T: 10/29/2012 09:48:58 ET JOB#: [id number]  cc: Annice NeedyJason S. Huriel Matt, MD, <Dictator> Maureen P. Alison MurrayAndreassi, MD Annice NeedyJASON S Reality Dejonge MD ELECTRONICALLY SIGNED 10/29/2012 11:53

## 2014-12-07 ENCOUNTER — Other Ambulatory Visit: Payer: Self-pay | Admitting: Internal Medicine

## 2014-12-08 ENCOUNTER — Other Ambulatory Visit: Payer: Self-pay | Admitting: Internal Medicine

## 2015-01-02 ENCOUNTER — Other Ambulatory Visit: Payer: Self-pay | Admitting: Internal Medicine

## 2015-01-02 NOTE — Telephone Encounter (Signed)
Left message for pt to return my call.

## 2015-01-05 NOTE — Telephone Encounter (Signed)
Called pt in December on need for appt, also mailed letter. Called last week, left message, no appt has been scheduled. Last labs 03/28/14. Last appt 03/28/14. Refill?

## 2015-01-08 ENCOUNTER — Other Ambulatory Visit: Payer: Self-pay | Admitting: Internal Medicine

## 2015-01-08 DIAGNOSIS — E669 Obesity, unspecified: Secondary | ICD-10-CM

## 2015-01-08 DIAGNOSIS — E1159 Type 2 diabetes mellitus with other circulatory complications: Principal | ICD-10-CM

## 2015-01-08 DIAGNOSIS — I10 Essential (primary) hypertension: Principal | ICD-10-CM

## 2015-01-08 DIAGNOSIS — E1169 Type 2 diabetes mellitus with other specified complication: Principal | ICD-10-CM

## 2015-01-08 NOTE — Telephone Encounter (Signed)
Spoke with pt asked pt to schedule lab appoint, he stated he would stop by one day before 6.2.16 for labs.

## 2015-01-08 NOTE — Telephone Encounter (Signed)
Pt scheduled appoint for 6.2.16, last OV 8.14.15.  Please advise refill

## 2015-01-08 NOTE — Telephone Encounter (Signed)
30 day refill ok,  Needs labs done before the visit  , labs ordered PLEASE CALL PATIENT AND GETS LAB APPT MADE

## 2015-01-15 ENCOUNTER — Ambulatory Visit: Payer: Self-pay | Admitting: Internal Medicine

## 2015-01-29 ENCOUNTER — Ambulatory Visit (INDEPENDENT_AMBULATORY_CARE_PROVIDER_SITE_OTHER): Payer: Self-pay | Admitting: Internal Medicine

## 2015-01-29 DIAGNOSIS — E1159 Type 2 diabetes mellitus with other circulatory complications: Secondary | ICD-10-CM

## 2015-01-29 DIAGNOSIS — I70209 Unspecified atherosclerosis of native arteries of extremities, unspecified extremity: Secondary | ICD-10-CM

## 2015-01-31 NOTE — Assessment & Plan Note (Signed)
Patient failed to keep scheduled appointment and will be charged a no show fee.   

## 2015-01-31 NOTE — Progress Notes (Signed)
Patient failed to keep scheduled appointment and will be charged a no show fee.   

## 2015-02-03 ENCOUNTER — Other Ambulatory Visit (INDEPENDENT_AMBULATORY_CARE_PROVIDER_SITE_OTHER): Payer: PPO

## 2015-02-03 DIAGNOSIS — E669 Obesity, unspecified: Secondary | ICD-10-CM | POA: Diagnosis not present

## 2015-02-03 DIAGNOSIS — I10 Essential (primary) hypertension: Secondary | ICD-10-CM | POA: Diagnosis not present

## 2015-02-03 DIAGNOSIS — E1169 Type 2 diabetes mellitus with other specified complication: Principal | ICD-10-CM

## 2015-02-03 DIAGNOSIS — E119 Type 2 diabetes mellitus without complications: Secondary | ICD-10-CM | POA: Diagnosis not present

## 2015-02-03 DIAGNOSIS — E1159 Type 2 diabetes mellitus with other circulatory complications: Principal | ICD-10-CM

## 2015-02-03 LAB — COMPREHENSIVE METABOLIC PANEL
ALBUMIN: 4.5 g/dL (ref 3.5–5.2)
ALT: 32 U/L (ref 0–53)
AST: 29 U/L (ref 0–37)
Alkaline Phosphatase: 40 U/L (ref 39–117)
BUN: 14 mg/dL (ref 6–23)
CO2: 27 mEq/L (ref 19–32)
CREATININE: 1.09 mg/dL (ref 0.40–1.50)
Calcium: 9.8 mg/dL (ref 8.4–10.5)
Chloride: 100 mEq/L (ref 96–112)
GFR: 72.02 mL/min (ref >60.00)
GLUCOSE: 376 mg/dL — AB (ref 70–99)
Potassium: 5.1 mEq/L (ref 3.5–5.1)
Sodium: 135 mEq/L (ref 135–145)
Total Bilirubin: 0.3 mg/dL (ref 0.2–1.2)
Total Protein: 7.4 g/dL (ref 6.0–8.3)

## 2015-02-03 LAB — LIPID PANEL
CHOLESTEROL: 108 mg/dL (ref 0–200)
HDL: 36.3 mg/dL — ABNORMAL LOW (ref >39.00)
LDL CALC: 35 mg/dL (ref 0–99)
NonHDL: 71.7
TRIGLYCERIDES: 183 mg/dL — AB (ref 0.0–149.0)
Total CHOL/HDL Ratio: 3
VLDL: 36.6 mg/dL (ref 0.0–40.0)

## 2015-02-03 LAB — HEMOGLOBIN A1C: Hgb A1c MFr Bld: 10.8 % — ABNORMAL HIGH (ref 4.6–6.5)

## 2015-02-03 LAB — MICROALBUMIN / CREATININE URINE RATIO
CREATININE, U: 91 mg/dL
MICROALB/CREAT RATIO: 1.5 mg/g (ref 0.0–30.0)
Microalb, Ur: 1.4 mg/dL (ref 0.0–1.9)

## 2015-02-05 ENCOUNTER — Other Ambulatory Visit: Payer: Self-pay | Admitting: Internal Medicine

## 2015-02-05 NOTE — Telephone Encounter (Signed)
Ok to refill since he  had his labs , but since we have not been able to contact him and he still needs an office appt ASAP , please tell pharmacy to have him call the office [facility] to set up appt

## 2015-02-05 NOTE — Telephone Encounter (Signed)
Last OV 8.14.15.  Please advise refill 

## 2015-02-06 ENCOUNTER — Other Ambulatory Visit: Payer: Self-pay | Admitting: *Deleted

## 2015-02-06 MED ORDER — RIVAROXABAN 20 MG PO TABS: 20.0000 mg | ORAL_TABLET | Freq: Every day | ORAL | Status: DC

## 2015-02-06 MED ORDER — PRAVASTATIN SODIUM 80 MG PO TABS: 80.0000 mg | ORAL_TABLET | Freq: Every day | ORAL | Status: DC

## 2015-02-06 NOTE — Telephone Encounter (Signed)
Spoke with pharmacist, she states she will put a note on Rx to call for appoint

## 2015-02-09 ENCOUNTER — Encounter: Payer: Self-pay | Admitting: *Deleted

## 2015-02-14 ENCOUNTER — Other Ambulatory Visit: Payer: Self-pay | Admitting: Internal Medicine

## 2015-02-17 NOTE — Telephone Encounter (Signed)
Last OV 8.14.15.  Please advise refill

## 2015-02-18 NOTE — Telephone Encounter (Signed)
Refill for 30 days only.  Has not been seen in 8 10 months  so patient needs a  30 minute visit

## 2015-02-20 ENCOUNTER — Other Ambulatory Visit: Payer: Self-pay | Admitting: Internal Medicine

## 2015-03-09 ENCOUNTER — Ambulatory Visit (INDEPENDENT_AMBULATORY_CARE_PROVIDER_SITE_OTHER): Payer: PPO | Admitting: Internal Medicine

## 2015-03-09 ENCOUNTER — Encounter: Payer: Self-pay | Admitting: Internal Medicine

## 2015-03-09 VITALS — BP 132/84 | HR 83 | Temp 98.5°F | Ht 70.75 in | Wt 211.5 lb

## 2015-03-09 DIAGNOSIS — E1151 Type 2 diabetes mellitus with diabetic peripheral angiopathy without gangrene: Secondary | ICD-10-CM

## 2015-03-09 DIAGNOSIS — I70209 Unspecified atherosclerosis of native arteries of extremities, unspecified extremity: Secondary | ICD-10-CM | POA: Diagnosis not present

## 2015-03-09 DIAGNOSIS — E1159 Type 2 diabetes mellitus with other circulatory complications: Secondary | ICD-10-CM | POA: Diagnosis not present

## 2015-03-09 MED ORDER — METFORMIN HCL 500 MG PO TABS: 500.0000 mg | ORAL_TABLET | Freq: Two times a day (BID) | ORAL | Status: DC

## 2015-03-09 MED ORDER — SITAGLIPTIN PHOSPHATE 100 MG PO TABS: 100.0000 mg | ORAL_TABLET | Freq: Every day | ORAL | Status: DC

## 2015-03-09 NOTE — Progress Notes (Signed)
Pre visit review using our clinic review tool, if applicable. No additional management support is needed unless otherwise documented below in the visit note. 

## 2015-03-09 NOTE — Progress Notes (Signed)
Subjective:  Patient ID: Johnathan Hernandez, male    DOB: [birth year]  Age: 64 y.o. MRN: [id number]  CC: There were no encounter diagnoses.  HPI Johnathan Hernandez presents for uncontrolled DM,  Diagnosed in 2006.  He has been lost to follow up for 11 months.  Has been out of metformrin for two weeks  So he did not check his blood sugars or record.them to bring for his visit today.  He states that his fasting sugars have been 150 to 200,  He has not been physically active due to the heat.  He has significant home stressors and has been neglecting his health.   Too hot to exercise,  Eating poorly   Dinner is sometimes just a sweet potato.  Only eats one meal per day,  Frustrated at not losing weight . [name] Has a limited understanding of diabetes,  Medications and   Using wife's glucometer.  , not sure what kind  , maybe a One Touch Ultra .    Sinuses draining a lot at night   Outpatient Prescriptions Prior to Visit  Medication Sig Dispense Refill  . Cholecalciferol (VITAMIN D3) 2000 UNITS TABS Take by mouth.    . enalapril (VASOTEC) 2.5 MG tablet TAKE ONE TABLET BY MOUTH ONCE DAILY 30 tablet 0  . esomeprazole (NEXIUM) 40 MG capsule Take 1 capsule (40 mg total) by mouth daily. 30 capsule 3  . ferrous sulfate 325 (65 FE) MG tablet Take 325 mg by mouth daily with breakfast.    . fish oil-omega-3 fatty acids 1000 MG capsule Take 2 g by mouth daily.    [name] gabapentin (NEURONTIN) 600 MG tablet Take 1,200 mg by mouth 3 (three) times daily.    [name] glipiZIDE (GLUCOTROL) 10 MG tablet TAKE ONE TABLET BY MOUTH TWICE DAILY BEFORE A MEAL 180 tablet 1  . glucose monitoring kit (FREESTYLE) monitoring kit 1 each by Does not apply route as needed for other. PATIENT TO CHOOSE THE GLUCOMETER OF HIS CHOICE 1 each 1  . HYDROmorphone (DILAUDID) 4 MG tablet Take 6 mg by mouth 4 (four) times daily.    . metFORMIN (GLUCOPHAGE) 1000 MG tablet TAKE ONE TABLET BY MOUTH TWICE DAILY WITH MEALS 60 tablet 5  . Multiple Vitamin  (MULTIVITAMIN) tablet Take 1 tablet by mouth daily.    . pravastatin (PRAVACHOL) 80 MG tablet Take 1 tablet (80 mg total) by mouth daily. 90 tablet 0  . rivaroxaban (XARELTO) 20 MG TABS tablet Take 1 tablet (20 mg total) by mouth daily. 30 tablet 5  . pregabalin (LYRICA) 75 MG capsule Take 200 mg by mouth 3 (three) times daily. Given samples.    . vitamin B-12 (CYANOCOBALAMIN) 1000 MCG tablet Take 1,000 mcg by mouth daily.    [name] aspirin 81 MG tablet Take 81 mg by mouth daily.    Tery Sanfilippo Calcium (STOOL SOFTENER PO) Take by mouth.    . enalapril (VASOTEC) 2.5 MG tablet TAKE ONE TABLET BY MOUTH ONCE DAILY 30 tablet 0  . enalapril (VASOTEC) 2.5 MG tablet TAKE ONE TABLET BY MOUTH ONCE DAILY 30 tablet 0  . omeprazole (PRILOSEC) 20 MG capsule Take 20 mg by mouth daily. Please fill with the generic.     No facility-administered medications prior to visit.    Review of Systems;  Patient denies headache, fevers, malaise, unintentional weight loss, skin rash, eye pain, sinus congestion and sinus pain, sore throat, dysphagia,  hemoptysis , cough, dyspnea, wheezing, chest pain, palpitations, orthopnea, edema,  abdominal pain, nausea, melena, diarrhea, constipation, flank pain, dysuria, hematuria, urinary  Frequency, nocturia, numbness, tingling, seizures,  Focal weakness, Loss of consciousness,  Tremor, insomnia, depression, anxiety, and suicidal ideation.      Objective:  BP 132/84 mmHg  Pulse 83  Temp(Src) 98.5 F (36.9 C) (Oral)  Ht 5' 10.75" (1.797 m)  Wt 211 lb 8 oz (95.936 kg)  BMI 29.71 kg/m2  SpO2 94%  BP Readings from Last 3 Encounters:  03/09/15 132/84  03/28/14 126/74  11/01/13 140/70    Wt Readings from Last 3 Encounters:  03/09/15 211 lb 8 oz (95.936 kg)  03/28/14 221 lb (100.245 kg)  11/01/13 221 lb 8 oz (100.472 kg)    General appearance: alert, cooperative and appears stated age Ears: normal TM's and external ear canals both ears Throat: lips, mucosa, and tongue  normal; teeth and gums normal Neck: no adenopathy, no carotid bruit, supple, symmetrical, trachea midline and thyroid not enlarged, symmetric, no tenderness/mass/nodules Back: symmetric, no curvature. ROM normal. No CVA tenderness. Lungs: clear to auscultation bilaterally Heart: regular rate and rhythm, S1, S2 normal, no murmur, click, rub or gallop Abdomen: soft, non-tender; bowel sounds normal; no masses,  no organomegaly Pulses: 2+ and symmetric Skin: Skin color, texture, turgor normal. No rashes or lesions Lymph nodes: Cervical, supraclavicular, and axillary nodes normal.  Lab Results  Component Value Date   HGBA1C 10.8* 02/03/2015   HGBA1C CANCELED 03/28/2014   HGBA1C 8.6* 05/21/2013    Lab Results  Component Value Date   CREATININE 1.09 02/03/2015   CREATININE 1.00 03/28/2014   CREATININE 1.0 05/21/2013    Lab Results  Component Value Date   WBC 8.8 05/21/2013   HGB 12.1* 05/21/2013   HCT 37.5* 05/21/2013   PLT 316.0 05/21/2013   GLUCOSE 376* 02/03/2015   CHOL 108 02/03/2015   TRIG 183.0* 02/03/2015   HDL 36.30* 02/03/2015   LDLDIRECT 108.1 02/12/2013   LDLCALC 35 02/03/2015   ALT 32 02/03/2015   AST 29 02/03/2015   NA 135 02/03/2015   K 5.1 02/03/2015   CL 100 02/03/2015   CREATININE 1.09 02/03/2015   BUN 14 02/03/2015   CO2 27 02/03/2015   TSH 0.66 09/17/2012   PSA 2.1 06/14/2012   HGBA1C 10.8* 02/03/2015   MICROALBUR 1.4 02/03/2015    No results found.  Assessment & Plan:   Problem List Items Addressed This Visit    None      I have discontinued Mr. Warren aspirin and Docusate Calcium (STOOL SOFTENER PO). I am also having him maintain his pregabalin, multivitamin, fish oil-omega-3 fatty acids, vitamin B-12, Vitamin D3, esomeprazole, glucose monitoring kit, ferrous sulfate, HYDROmorphone, gabapentin, enalapril, glipiZIDE, metFORMIN, pravastatin, rivaroxaban, diclofenac sodium, traMADol, aspirin EC, docusate sodium, and Omeprazole.  Meds ordered this  encounter  Medications  . diclofenac sodium (VOLTAREN) 1 % GEL    Sig: Apply 2 g topically 4 (four) times daily.  . traMADol (ULTRAM) 50 MG tablet    Sig: Take 50 mg by mouth as needed.  [name] aspirin EC 81 MG tablet    Sig: Take 81 mg by mouth once.  . docusate sodium (STOOL SOFTENER) 100 MG capsule    Sig: Take 100 mg by mouth once.  . Omeprazole (RA OMEPRAZOLE) 20 MG TBEC    Sig: Take 40 mg by mouth once.   A total of 25 minutes of face to face time was spent with patient more than half of which was spent in counselling about the above mentioned  conditions  and coordination of care   Medications Discontinued During This Encounter  Medication Reason  . omeprazole (PRILOSEC) 20 MG capsule Duplicate  . Docusate Calcium (STOOL SOFTENER PO) Duplicate  . aspirin 81 MG tablet Duplicate  . enalapril (VASOTEC) 2.5 MG tablet Error  . enalapril (VASOTEC) 2.5 MG tablet Error    Follow-up: No Follow-up on file.   Crecencio Mc, MD

## 2015-03-09 NOTE — Patient Instructions (Addendum)
I am recommending that you attend a diabetes education program to help you anage your diabetes better.   The referral is in process   Please  Resume the metformin , continue taking glipizide , and add Januvia .  If it is not covered by your insurance, please let me know.    You cannot skip meals and manage your diabetes!   You can drink a protein shake instead of skipping breakfast.  Here are several good choices for diabetics  Atkins Muscle Milk EAS Carb Control AdvantEdge Premier Protein   Please check your sugars twice daily `  Let  Me know what the name of the morning sinus medication .  Equate is not the name, it's the brand   You can try taking benadryl  In the evening (25 mg diphenhydramine  Is the generic version )  For your evening sinus symptoms

## 2015-03-11 ENCOUNTER — Telehealth: Payer: Self-pay | Admitting: *Deleted

## 2015-03-11 MED ORDER — SAXAGLIPTIN HCL 5 MG PO TABS: 5.0000 mg | ORAL_TABLET | Freq: Every day | ORAL | Status: DC

## 2015-03-11 NOTE — Telephone Encounter (Signed)
Spoke with pt, advised alternative med sent

## 2015-03-11 NOTE — Telephone Encounter (Signed)
Pt called states the Januvia Rx is too expensive.  please advise

## 2015-03-11 NOTE — Telephone Encounter (Signed)
onglyza sent as an alternative

## 2015-03-11 NOTE — Assessment & Plan Note (Signed)
Uncontrolled due to dietary nd medication noncompliance,  Patient is non receptive to use of insulin and often skips meals  Referral for diabetes education advised, . Adding onglyza today,  Asking patient to submit blood sugars every 2 weeks for meidcation changes. .  Lab Results  Component Value Date   HGBA1C 10.8* 02/03/2015   Lab Results  Component Value Date   MICROALBUR 1.4 02/03/2015

## 2015-03-16 NOTE — Telephone Encounter (Signed)
THE PATIENT WILL NEED TO START USING INSULIN PLEASE MAKE HIM AN APPT

## 2015-03-16 NOTE — Telephone Encounter (Signed)
Attempted to call the patient, left a voice message.

## 2015-03-16 NOTE — Telephone Encounter (Signed)
Message from pharmacy, that Onglyza is not covered.  Please advise?

## 2015-03-25 ENCOUNTER — Telehealth: Payer: Self-pay | Admitting: *Deleted

## 2015-03-25 ENCOUNTER — Telehealth: Payer: Self-pay | Admitting: Internal Medicine

## 2015-03-25 DIAGNOSIS — I70209 Unspecified atherosclerosis of native arteries of extremities, unspecified extremity: Principal | ICD-10-CM

## 2015-03-25 DIAGNOSIS — E1151 Type 2 diabetes mellitus with diabetic peripheral angiopathy without gangrene: Secondary | ICD-10-CM

## 2015-03-25 NOTE — Telephone Encounter (Signed)
His blood sugars are improved.  Continue current medications and return for repeat fasting labs  on or after Sept 21

## 2015-03-25 NOTE — Telephone Encounter (Signed)
Patient dropped of a kept record of his blood sugars that he recorded. The envelope is in  Dr. Melina Schools box. -Thanks

## 2015-03-25 NOTE — Telephone Encounter (Signed)
In red folder. 

## 2015-03-26 NOTE — Telephone Encounter (Signed)
No voice mail--will try later

## 2015-03-28 ENCOUNTER — Emergency Department
Admission: EM | Admit: 2015-03-28 | Discharge: 2015-03-28 | Disposition: A | Discharge status: Home / Self Care | Payer: PPO | Attending: Emergency Medicine | Admitting: Emergency Medicine

## 2015-03-28 ENCOUNTER — Other Ambulatory Visit: Payer: Self-pay

## 2015-03-28 ENCOUNTER — Encounter: Payer: Self-pay | Admitting: Emergency Medicine

## 2015-03-28 DIAGNOSIS — Z79891 Long term (current) use of opiate analgesic: Secondary | ICD-10-CM | POA: Insufficient documentation

## 2015-03-28 DIAGNOSIS — Z79899 Other long term (current) drug therapy: Secondary | ICD-10-CM | POA: Diagnosis not present

## 2015-03-28 DIAGNOSIS — Z7982 Long term (current) use of aspirin: Secondary | ICD-10-CM | POA: Insufficient documentation

## 2015-03-28 DIAGNOSIS — E1140 Type 2 diabetes mellitus with diabetic neuropathy, unspecified: Secondary | ICD-10-CM | POA: Insufficient documentation

## 2015-03-28 DIAGNOSIS — R51 Headache: Secondary | ICD-10-CM | POA: Insufficient documentation

## 2015-03-28 DIAGNOSIS — R61 Generalized hyperhidrosis: Secondary | ICD-10-CM | POA: Insufficient documentation

## 2015-03-28 DIAGNOSIS — I10 Essential (primary) hypertension: Secondary | ICD-10-CM | POA: Insufficient documentation

## 2015-03-28 DIAGNOSIS — R110 Nausea: Secondary | ICD-10-CM | POA: Insufficient documentation

## 2015-03-28 DIAGNOSIS — Z7901 Long term (current) use of anticoagulants: Secondary | ICD-10-CM | POA: Diagnosis not present

## 2015-03-28 DIAGNOSIS — R55 Syncope and collapse: Secondary | ICD-10-CM | POA: Diagnosis not present

## 2015-03-28 DIAGNOSIS — Z87891 Personal history of nicotine dependence: Secondary | ICD-10-CM | POA: Diagnosis not present

## 2015-03-28 DIAGNOSIS — E1151 Type 2 diabetes mellitus with diabetic peripheral angiopathy without gangrene: Secondary | ICD-10-CM | POA: Diagnosis not present

## 2015-03-28 DIAGNOSIS — R531 Weakness: Secondary | ICD-10-CM | POA: Insufficient documentation

## 2015-03-28 DIAGNOSIS — Z791 Long term (current) use of non-steroidal anti-inflammatories (NSAID): Secondary | ICD-10-CM | POA: Insufficient documentation

## 2015-03-28 LAB — CBC
HCT: 40 % (ref 40.0–52.0)
Hemoglobin: 12.9 g/dL — ABNORMAL LOW (ref 13.0–18.0)
MCH: 27.2 pg (ref 26.0–34.0)
MCHC: 32.2 g/dL (ref 32.0–36.0)
MCV: 84.5 fL (ref 80.0–100.0)
PLATELETS: 195 10*3/uL (ref 150–440)
RBC: 4.74 MIL/uL (ref 4.40–5.90)
RDW: 14.9 % — ABNORMAL HIGH (ref 11.5–14.5)
WBC: 8 10*3/uL (ref 3.8–10.6)

## 2015-03-28 LAB — TROPONIN I: Troponin I: <0.03 ng/mL (ref <0.031)

## 2015-03-28 LAB — COMPREHENSIVE METABOLIC PANEL
ALBUMIN: 4.1 g/dL (ref 3.5–5.0)
ALT: 24 U/L (ref 17–63)
AST: 29 U/L (ref 15–41)
Alkaline Phosphatase: 34 U/L — ABNORMAL LOW (ref 38–126)
Anion gap: 8 (ref 5–15)
BUN: 11 mg/dL (ref 6–20)
CALCIUM: 8.7 mg/dL — AB (ref 8.9–10.3)
CO2: 24 mmol/L (ref 22–32)
CREATININE: 1.1 mg/dL (ref 0.61–1.24)
Chloride: 104 mmol/L (ref 101–111)
GFR calc Af Amer: >60 mL/min (ref >60)
Glucose, Bld: 185 mg/dL — ABNORMAL HIGH (ref 65–99)
Potassium: 4.2 mmol/L (ref 3.5–5.1)
SODIUM: 136 mmol/L (ref 135–145)
Total Bilirubin: 0.3 mg/dL (ref 0.3–1.2)
Total Protein: 7.1 g/dL (ref 6.5–8.1)

## 2015-03-28 NOTE — ED Provider Notes (Signed)
[facility] Emergency Department Provider Note  ____________________________________________  Time seen: Approximately 2:59 PM  I have reviewed the triage vital signs and the nursing notes.   HISTORY  Chief Complaint Near Syncope    HPI Johnathan Hernandez is a 64 y.o. male history of coronary disease as well as diabetes. He presents today after he was at a store using the bathroom when he suddenly began to feel lightheaded, nauseated and weak. He reports that he felt very weak and sweaty and had to lower himself to the floor and called for assistance. He reports that he noted did not have any chest pain or trouble breathing and by the time he arrived to the ER he now feels normal and fine.  Patient reports that he was in normal state of health and had been walking around [employer] today without any issues. His blood sugar was checked and it was found to be 206 at the time he is having symptoms.  Again, he reports he feels well now. No symptoms.  No numbness or tingling. No weakness in arm or leg. No trouble speaking. No droopy face.  Past Medical History  Diagnosis Date  . Diabetes mellitus   . Hypertension   . Hyperlipidemia   . Peripheral vascular disease Dec 2012    [facility], treated with extensive arterial bypass  . History of tobacco abuse     quit in 2006 after 5 vessel cabg  . Coronary artery disease     Patient Active Problem List   Diagnosis Date Noted  . Other general symptoms(780.99) 09/10/2012  . Other vitamin B12 deficiency anemia 09/10/2012  . Diabetic polyneuropathy 09/10/2012  . Radiculopathy of lumbar region 07/24/2012  . PE (pulmonary embolism) 07/24/2012  . Hypertension associated with diabetes 04/30/2012  . Vasomotor flushing 04/30/2012  . Hyperlipidemia with target LDL less than 70 11/16/2011  . 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 Peripheral vascular disease   . History of tobacco  abuse   . Coronary artery disease     Past Surgical History  Procedure Laterality Date  . Coronary artery bypass graft  2006    [facility]  . Aorta - iliac artery bypass graft  dec 2012    [facility]    Current Outpatient Rx  Name  Route  Sig  Dispense  Refill  . aspirin EC 81 MG tablet   Oral   Take 81 mg by mouth once.         . Cholecalciferol (VITAMIN D3) 2000 UNITS TABS   Oral   Take by mouth.         . diclofenac sodium (VOLTAREN) 1 % GEL   Topical   Apply 2 g topically 4 (four) times daily.         [name] docusate sodium (STOOL SOFTENER) 100 MG capsule   Oral   Take 100 mg by mouth once.         . enalapril (VASOTEC) 2.5 MG tablet      TAKE ONE TABLET BY MOUTH ONCE DAILY   30 tablet   0   . esomeprazole (NEXIUM) 40 MG capsule   Oral   Take 1 capsule (40 mg total) by mouth daily.   30 capsule   3   . ferrous sulfate 325 (65 FE) MG tablet   Oral   Take 325 mg by mouth daily with breakfast.         . fish oil-omega-3 fatty acids  1000 MG capsule   Oral   Take 2 g by mouth daily.         [name] gabapentin (NEURONTIN) 600 MG tablet   Oral   Take 1,200 mg by mouth 3 (three) times daily.         [name] glipiZIDE (GLUCOTROL) 10 MG tablet      TAKE ONE TABLET BY MOUTH TWICE DAILY BEFORE A MEAL   180 tablet   1   . glucose monitoring kit (FREESTYLE) monitoring kit   Does not apply   1 each by Does not apply route as needed for other. PATIENT TO CHOOSE THE GLUCOMETER OF HIS CHOICE   1 each   1   . HYDROmorphone (DILAUDID) 4 MG tablet   Oral   Take 6 mg by mouth 4 (four) times daily.         . metFORMIN (GLUCOPHAGE) 1000 MG tablet      TAKE ONE TABLET BY MOUTH TWICE DAILY WITH MEALS   60 tablet   5   . metFORMIN (GLUCOPHAGE) 500 MG tablet   Oral   Take 1 tablet (500 mg total) by mouth 2 (two) times daily with a meal.   180 tablet   3   . Multiple Vitamin (MULTIVITAMIN) tablet   Oral   Take 1 tablet by mouth daily.         . Omeprazole (RA  OMEPRAZOLE) 20 MG TBEC   Oral   Take 40 mg by mouth once.         . pravastatin (PRAVACHOL) 80 MG tablet   Oral   Take 1 tablet (80 mg total) by mouth daily.   90 tablet   0   . pregabalin (LYRICA) 75 MG capsule   Oral   Take 200 mg by mouth 3 (three) times daily. Given samples.         . rivaroxaban (XARELTO) 20 MG TABS tablet   Oral   Take 1 tablet (20 mg total) by mouth daily.   30 tablet   5   . saxagliptin HCl (ONGLYZA) 5 MG TABS tablet   Oral   Take 1 tablet (5 mg total) by mouth daily.   30 tablet   5   . sitaGLIPtin (JANUVIA) 100 MG tablet   Oral   Take 1 tablet (100 mg total) by mouth daily.   90 tablet   1   . traMADol (ULTRAM) 50 MG tablet   Oral   Take 50 mg by mouth as needed.         . vitamin B-12 (CYANOCOBALAMIN) 1000 MCG tablet   Oral   Take 1,000 mcg by mouth daily.           Allergies Percocet; Vicodin; and Duloxetine hcl  Family History  Problem Relation Age of Onset  . Heart disease Mother   . Diabetes Mother   . Cancer Father     Lung CA /  . Diabetes Sister     Social History Social History  Substance Use Topics  . Smoking status: Former Smoker    Quit date: 11/13/2004  . Smokeless tobacco: Never Used  . Alcohol Use: No    Review of Systems Constitutional: No fever/chills Eyes: No visual changes. ENT: No sore throat. Cardiovascular: Denies chest pain. Respiratory: Denies shortness of breath. Gastrointestinal: No abdominal pain.  no vomiting.  No diarrhea.  No constipation. Genitourinary: Negative for dysuria. Musculoskeletal: Negative for back pain. Skin: Negative for rash. Neurological: Negative for headaches,  focal weakness or numbness.    10-point ROS otherwise negative.  ____________________________________________   PHYSICAL EXAM:  VITAL SIGNS: ED Triage Vitals  Enc Vitals Group     BP 03/28/15 1352 129/72 mmHg     Pulse Rate 03/28/15 1352 77     Resp --      Temp 03/28/15 1352 98.1 F (36.7  C)     Temp Source 03/28/15 1352 Oral     SpO2 03/28/15 1352 99 %     Weight 03/28/15 1352 215 lb (97.523 kg)     Height 03/28/15 1352 $RemoveBefor'5\' 11"'[id number]$  (1.803 m)     Head Cir --      Peak Flow --      Pain Score --      Pain Loc --      Pain Edu? --      Excl. in [place]? --     Constitutional: Alert and oriented. Well appearing and in no acute distress. Eyes: Conjunctivae are normal. PERRL. EOMI. Head: Atraumatic. Nose: No congestion/rhinnorhea. Mouth/Throat: Mucous membranes are moist.  Oropharynx non-erythematous. Neck: No stridor.   Cardiovascular: Normal rate, regular rhythm. Grossly normal heart sounds.  Good peripheral circulation. Respiratory: Normal respiratory effort.  No retractions. Lungs CTAB. Gastrointestinal: Soft and nontender. No distention. No abdominal bruits. No CVA tenderness. Musculoskeletal: No lower extremity tenderness nor edema.  No joint effusions. Neurologic:  Normal speech and language. No gross focal neurologic deficits are appreciated. Cranial nerves normal. Obstructive movements intact. 5 over 5 strength all extremities. Patient does have stocking glove paresthesias, but these are chronic. Skin:  Skin is warm, dry and intact. No rash noted. Psychiatric: Mood and affect are normal. Speech and behavior are normal.  ____________________________________________   LABS (all labs ordered are listed, but only abnormal results are displayed)  Labs Reviewed  CBC - Abnormal; Notable for the following:    Hemoglobin 12.9 (*)    RDW 14.9 (*)    All other components within normal limits  COMPREHENSIVE METABOLIC PANEL - Abnormal; Notable for the following:    Glucose, Bld 185 (*)    Calcium 8.7 (*)    Alkaline Phosphatase 34 (*)    All other components within normal limits  TROPONIN I  TROPONIN I  CBG MONITORING, ED   ____________________________________________  EKG  ED ECG REPORT I, Masahiro Iglesia, the attending physician, personally viewed and interpreted this  ECG.  Date: 03/28/2015 EKG Time: 1410 Rate: 75 Rhythm: normal sinus rhythm QRS Axis: normal Intervals: normal ST/T Wave abnormalities: normal Conduction Disutrbances: none Narrative Interpretation: unremarkable  ____________________________________________  RADIOLOGY   ____________________________________________   PROCEDURES  Procedure(s) performed: None  Critical Care performed: No  ____________________________________________   INITIAL IMPRESSION / ASSESSMENT AND PLAN / ED COURSE  Pertinent labs & imaging results that were available during my care of the patient were reviewed by me and considered in my medical decision making (see chart for details).  Patient presents after a near syncopal episode. Neurologically intact. No acute cardiac or pulmonary symptoms. Vital signs are stable and his exam is very reassuring as he is no longer symptomatic in the emergency room. This did occur in the setting of using the bathroom, vasovagal syncope is considered but given the patient's long-standing history we will obtain cardiac biomarkers, laboratory analysis and EKG. His EKG does not show any acute cardiac normality of his first set of laboratory analysis and troponin are very reassuring.  ----------------------------------------- 4:43 PM on 03/28/2015 -----------------------------------------  Patient resting comfortably. No concerns.  He is very am able, and in no distress. Awaiting second troponin, should this be negative I will discharge him to home with close return precautions. Nothing to indicate patient currently suffered from any acute cardiopulmonary or neurologic abnormality. I suspect he likely did have indeed a vasovagal type episode.  ----------------------------------------- 7:34 PM on 03/28/2015 -----------------------------------------  Remains a symptomatic. Troponin negative 2 and observed in the emergency room. He is awake alert in no distress. We'll  discharge him to home with return precautions for a presyncopal episode, patient agreeable follow-up with his primary care doctor as well as with return precautions. ____________________________________________   FINAL CLINICAL IMPRESSION(S) / ED DIAGNOSES  Final diagnoses:  Syncope, near      Delman Kitten, MD 03/28/15 1935

## 2015-03-28 NOTE — Discharge Instructions (Signed)
Near-Syncope  You have been seen today in the Emergency Department (ED)  for near syncope (passing out).  Your workup including labs and EKG show reassuring results.  Your symptoms Judice be due to dehydration, so it is important that you drink plenty of non-alcoholic fluids.  Please call your regular doctor as soon as possible to schedule the next available clinic appointment to follow up with him/her regarding your visit to the ED and your symptoms.  Return to the Emergency Department (ED)  if you have any further syncopal episodes (pass out again) or develop ANY chest pain, pressure, tightness, trouble breathing, sudden sweating, or other symptoms that concern you.    Near-syncope (commonly known as near fainting) is sudden weakness, dizziness, or feeling like you might pass out. During an episode of near-syncope, you Regino also develop pale skin, have tunnel vision, or feel sick to your stomach (nauseous). Near-syncope Son occur when getting up after sitting or while standing for a long time. It is caused by a sudden decrease in blood flow to the brain. This decrease can result from various causes or triggers, most of which are not serious. However, because near-syncope can sometimes be a sign of something serious, a medical evaluation is required. The specific cause is often not determined. HOME CARE INSTRUCTIONS  Monitor your condition for any changes. The following actions Ne[place] help to alleviate any discomfort you are experiencing:  Have someone stay with you until you feel stable.  Lie down right away and prop your feet up if you start feeling like you might faint. Breathe deeply and steadily. Wait until all the symptoms have passed. Most of these episodes last only a few minutes. You [place] feel tired for several hours.   Drink enough fluids to keep your urine clear or pale yellow.   If you are taking blood pressure or heart medicine, get up slowly when seated or lying down. Take several minutes  to sit and then stand. This can reduce dizziness.  Follow up with your health care provider as directed. SEEK IMMEDIATE MEDICAL CARE IF:   You have a severe headache.   You have unusual pain in the chest, abdomen, or back.   You are bleeding from the mouth or rectum, or you have black or tarry stool.   You have an irregular or very fast heartbeat.   You have repeated fainting or have seizure-like jerking during an episode.   You faint when sitting or lying down.   You have confusion.   You have difficulty walking.   You have severe weakness.   You have vision problems.  MAKE SURE YOU:   Understand these instructions.  Will watch your condition.  Will get help right away if you are not doing well or get worse. Document Released: 08/01/2005 Document Revised: 08/06/2013 Document Reviewed: 01/04/2013 [facility] Patient Information 2015 [place], [place]. This information is not intended to replace advice given to you by your health care provider. Make sure you discuss any questions you have with your health care provider.

## 2015-03-28 NOTE — ED Notes (Signed)
Pt states was out doing errands - had to stop at gas station to have bm and became weak, diaphoretic. States still feels weak

## 2015-03-28 NOTE — ED Notes (Signed)
Troponin redrawn as ordered - pt stood up at bedside to urinate and states no pain, no dizziness. States feels better from the fluids the ems gave him.

## 2015-03-31 ENCOUNTER — Encounter: Payer: Self-pay | Admitting: *Deleted

## 2015-03-31 NOTE — Telephone Encounter (Signed)
Unable to reach patient by phone - letter mailed

## 2015-04-13 ENCOUNTER — Other Ambulatory Visit: Payer: Self-pay | Admitting: Internal Medicine

## 2015-04-23 ENCOUNTER — Other Ambulatory Visit: Payer: Self-pay | Admitting: Internal Medicine

## 2015-05-29 ENCOUNTER — Other Ambulatory Visit: Payer: Self-pay | Admitting: Internal Medicine

## 2015-06-02 ENCOUNTER — Other Ambulatory Visit: Payer: Self-pay | Admitting: Internal Medicine

## 2015-07-12 ENCOUNTER — Other Ambulatory Visit: Payer: Self-pay | Admitting: Internal Medicine

## 2015-07-19 ENCOUNTER — Other Ambulatory Visit: Payer: Self-pay | Admitting: Internal Medicine

## 2015-08-28 ENCOUNTER — Other Ambulatory Visit: Payer: Self-pay | Admitting: Internal Medicine

## 2015-08-31 ENCOUNTER — Ambulatory Visit: Payer: Self-pay | Admitting: Family Medicine

## 2015-09-02 ENCOUNTER — Ambulatory Visit: Payer: Self-pay | Admitting: Internal Medicine

## 2015-09-02 ENCOUNTER — Ambulatory Visit: Payer: Self-pay | Admitting: Family Medicine

## 2015-09-02 DIAGNOSIS — M129 Arthropathy, unspecified: Secondary | ICD-10-CM | POA: Diagnosis not present

## 2015-09-02 DIAGNOSIS — G8929 Other chronic pain: Secondary | ICD-10-CM | POA: Diagnosis not present

## 2015-09-02 DIAGNOSIS — G6289 Other specified polyneuropathies: Secondary | ICD-10-CM | POA: Diagnosis not present

## 2015-09-02 DIAGNOSIS — M79604 Pain in right leg: Secondary | ICD-10-CM | POA: Diagnosis not present

## 2015-09-02 DIAGNOSIS — G894 Chronic pain syndrome: Secondary | ICD-10-CM | POA: Diagnosis not present

## 2015-09-02 DIAGNOSIS — M961 Postlaminectomy syndrome, not elsewhere classified: Secondary | ICD-10-CM | POA: Diagnosis not present

## 2015-09-02 DIAGNOSIS — M79605 Pain in left leg: Secondary | ICD-10-CM | POA: Diagnosis not present

## 2015-09-02 DIAGNOSIS — Z79899 Other long term (current) drug therapy: Secondary | ICD-10-CM | POA: Diagnosis not present

## 2015-09-02 DIAGNOSIS — M545 Low back pain: Secondary | ICD-10-CM | POA: Diagnosis not present

## 2015-09-02 DIAGNOSIS — Z79891 Long term (current) use of opiate analgesic: Secondary | ICD-10-CM | POA: Diagnosis not present

## 2015-09-02 DIAGNOSIS — M25511 Pain in right shoulder: Secondary | ICD-10-CM | POA: Diagnosis not present

## 2015-09-03 ENCOUNTER — Ambulatory Visit (INDEPENDENT_AMBULATORY_CARE_PROVIDER_SITE_OTHER): Payer: Medicare (Managed Care) | Admitting: Family Medicine

## 2015-09-03 ENCOUNTER — Encounter: Payer: Self-pay | Admitting: Family Medicine

## 2015-09-03 VITALS — BP 128/72 | HR 81 | Temp 98.1°F | Ht 70.75 in | Wt 224.1 lb

## 2015-09-03 DIAGNOSIS — I2699 Other pulmonary embolism without acute cor pulmonale: Secondary | ICD-10-CM

## 2015-09-03 DIAGNOSIS — J309 Allergic rhinitis, unspecified: Secondary | ICD-10-CM

## 2015-09-03 DIAGNOSIS — K219 Gastro-esophageal reflux disease without esophagitis: Secondary | ICD-10-CM | POA: Diagnosis not present

## 2015-09-03 MED ORDER — FLUTICASONE PROPIONATE 50 MCG/ACT NA SUSP: 2.0000 | Freq: Every day | NASAL | Status: DC

## 2015-09-03 NOTE — Progress Notes (Signed)
Subjective:  Patient ID: Johnathan Hernandez, male    DOB: Nov 26, 1950  Age: 65 y.o. MRN: [id number]  CC: Sinus issues, Concerned about PPI, Concerned about blood clot in leg   HPI:  65 year old male with a PMH of HTN, DM-2, CAD, Peripheral neuropathy presents with the above issues/concerns.  Sinus issues  Patient reports a 3 year history of rhinorrhea and post nasal drip.  He has been using PRN Benadryl with some improvement but no resolution.  No other meds/interventions tried.   No known exacerbating factors.  No other associated symptoms.  PPI Use  Patient reports he has seen TV commercials stating that Omeprazole and drugs like it cause kidney problems.  He would like to discuss this today.  Left ankle/foot pain  Patient reports recent pain (as above).  He states that he has had a blood clot before and would like to be evaluated today.  Social Hx   Social History   Social History  . Marital Status: Married    Spouse Name: N/A  . Number of Children: N/A  . Years of Education: N/A   Social History Main Topics  . Smoking status: Former Smoker    Quit date: 11/13/2004  . Smokeless tobacco: Never Used  . Alcohol Use: No  . Drug Use: No  . Sexual Activity: Not Asked   Other Topics Concern  . None   Social History Narrative   Review of Systems  Constitutional: Negative.   HENT: Positive for postnasal drip and rhinorrhea.    Objective:  BP 128/72 mmHg  Pulse 81  Temp(Src) 98.1 F (36.7 C) (Oral)  Ht 5' 10.75" (1.797 m)  Wt 224 lb 2 oz (101.662 kg)  BMI 31.48 kg/m2  SpO2 96%  BP/Weight 09/03/2015 03/28/2015 03/09/2015  Systolic BP 128 156 132  Diastolic BP 72 80 84  Wt. (Lbs) 224.13 215 211.5  BMI 31.48 30 29.71   Physical Exam  Constitutional: He is oriented to person, place, and time. He appears well-developed.  HENT:  Head: Normocephalic and atraumatic.  Mouth/Throat: Oropharynx is clear and moist.  Normal TM's bilaterally.  Cardiovascular:  Normal rate and regular rhythm.   Lower extremities without edema. Left lower leg - No calf pain, erythema. Negative Homan's sign.  Pulmonary/Chest: Effort normal and breath sounds normal.  Neurological: He is alert and oriented to person, place, and time.  Psychiatric: He has a normal mood and affect.  Vitals reviewed.  Lab Results  Component Value Date   WBC 8.0 03/28/2015   HGB 12.9* 03/28/2015   HCT 40.0 03/28/2015   PLT 195 03/28/2015   GLUCOSE 185* 03/28/2015   CHOL 108 02/03/2015   TRIG 183.0* 02/03/2015   HDL 36.30* 02/03/2015   LDLDIRECT 108.1 02/12/2013   LDLCALC 35 02/03/2015   ALT 24 03/28/2015   AST 29 03/28/2015   NA 136 03/28/2015   K 4.2 03/28/2015   CL 104 03/28/2015   CREATININE 1.10 03/28/2015   BUN 11 03/28/2015   CO2 24 03/28/2015   TSH 0.66 09/17/2012   PSA 2.1 06/14/2012   HGBA1C 10.8* 02/03/2015   MICROALBUR 1.4 02/03/2015    Assessment & Plan:   Problem List Items Addressed This Visit    PE (pulmonary embolism)    Patient with hx of DVT/PE. No signs of DVT on exam today.      Allergic rhinitis - Primary    Unremarkable exam. Advised Flonase and OTC Zyrtec.      GERD (gastroesophageal reflux  disease)    Patient concerned about long term PPI use and kidney disease. This occurs <1%. Advised that Omeprazole is safe to use. Advised to limit use given concern.         Meds ordered this encounter  Medications  . fluticasone (FLONASE) 50 MCG/ACT nasal spray    Sig: Place 2 sprays into both nostrils daily.    Dispense:  16 g    Refill:  6    Follow-up: PRN  Everlene Other DO [facility]

## 2015-09-03 NOTE — Assessment & Plan Note (Signed)
Patient with hx of DVT/PE. No signs of DVT on exam today.

## 2015-09-03 NOTE — Assessment & Plan Note (Signed)
Patient concerned about long term PPI use and kidney disease. This occurs <1%. Advised that Omeprazole is safe to use. Advised to limit use given concern.

## 2015-09-03 NOTE — Patient Instructions (Signed)
Use the flonase daily.  Take Zyrtec (it is over the counter) 10 mg daily.  You do not have a blood clot.  You can continue the omeprazole.  Take care  Dr. Adriana Simas

## 2015-09-03 NOTE — Assessment & Plan Note (Signed)
Unremarkable exam. Advised Flonase and OTC Zyrtec.

## 2015-11-09 ENCOUNTER — Other Ambulatory Visit: Payer: Self-pay | Admitting: Internal Medicine

## 2015-11-11 ENCOUNTER — Other Ambulatory Visit: Payer: Self-pay

## 2015-11-11 MED ORDER — RIVAROXABAN 20 MG PO TABS: ORAL_TABLET | ORAL | Status: DC

## 2015-11-11 NOTE — Telephone Encounter (Signed)
Pt is requesting a refill on XARELTO 20 MG TABS tablet. Pt needed to be scheduled for a OV. Pt was scheduled for 11/23/15 at 4pm. Please advise, thanks

## 2015-11-11 NOTE — Telephone Encounter (Signed)
Refill for 30 days only.  OFFICE VISIT NEEDED prior to any more refills 

## 2015-11-23 ENCOUNTER — Encounter: Payer: Self-pay | Admitting: Internal Medicine

## 2015-11-23 ENCOUNTER — Ambulatory Visit (INDEPENDENT_AMBULATORY_CARE_PROVIDER_SITE_OTHER): Payer: Commercial Managed Care - HMO | Admitting: Internal Medicine

## 2015-11-23 VITALS — BP 128/72 | HR 88 | Temp 98.4°F | Resp 12 | Ht 71.0 in | Wt 219.5 lb

## 2015-11-23 DIAGNOSIS — R1013 Epigastric pain: Secondary | ICD-10-CM

## 2015-11-23 DIAGNOSIS — G8929 Other chronic pain: Secondary | ICD-10-CM

## 2015-11-23 DIAGNOSIS — E785 Hyperlipidemia, unspecified: Secondary | ICD-10-CM

## 2015-11-23 DIAGNOSIS — Z125 Encounter for screening for malignant neoplasm of prostate: Secondary | ICD-10-CM

## 2015-11-23 DIAGNOSIS — E1159 Type 2 diabetes mellitus with other circulatory complications: Secondary | ICD-10-CM

## 2015-11-23 DIAGNOSIS — Z23 Encounter for immunization: Secondary | ICD-10-CM

## 2015-11-23 DIAGNOSIS — E1310 Other specified diabetes mellitus with ketoacidosis without coma: Secondary | ICD-10-CM | POA: Diagnosis not present

## 2015-11-23 DIAGNOSIS — E1142 Type 2 diabetes mellitus with diabetic polyneuropathy: Secondary | ICD-10-CM

## 2015-11-23 DIAGNOSIS — K2970 Gastritis, unspecified, without bleeding: Secondary | ICD-10-CM

## 2015-11-23 DIAGNOSIS — I70209 Unspecified atherosclerosis of native arteries of extremities, unspecified extremity: Secondary | ICD-10-CM

## 2015-11-23 DIAGNOSIS — E1151 Type 2 diabetes mellitus with diabetic peripheral angiopathy without gangrene: Secondary | ICD-10-CM

## 2015-11-23 DIAGNOSIS — Z9114 Patient's other noncompliance with medication regimen: Secondary | ICD-10-CM

## 2015-11-23 NOTE — Patient Instructions (Signed)
I am so sorry about your brother.  He was a great man  I am checking you for H Pylori   .Please resume your metformin and glipizide and check your sugars twice daily  Fasting  2 hrs after a meal  Return in 3 weeks with your blood sugar log

## 2015-11-23 NOTE — Progress Notes (Signed)
Subjective:  Patient ID: Johnathan Hernandez, male    DOB: 01-10-1951  Age: 65 y.o. MRN: [id number]  CC: The primary encounter diagnosis was Uncontrolled type 2 diabetes mellitus with ketoacidosis without coma, unspecified long term insulin use status ([place]). Diagnoses of Hyperlipidemia LDL goal <100, Abdominal pain, chronic, epigastric, Screening for prostate cancer, Diabetes mellitus with atherosclerosis of arteries of extremities ([place]), Noncompliance with medication treatment due to intermittent use of medication, Gastritis, and Diabetic polyneuropathy associated with type 2 diabetes mellitus ([place]) were also pertinent to this visit.  HPI Johnathan Hernandez presents for follow up on uncontrolled diabetes,  last seen in June 2016 at which time his A1c was 10.8.  He has been nonadherent to diabetic diet and to medication regimen for the last 3 months due to his brother's recent death from metastaic gastric cancer.  He has been unable to afford Januvia and states that he has had recurrent hypoglycemic symptoms when he takes glipizdie and metformin as directed.  He did not bring a log of his blood sugars with him today. Not checking sugars much.  Sugars 230 to 250 when he checks it .  Has chronic neuropathy and numbness of feet,  Etiology multifactorial from diabetes and spinal stenosis.  Last eye exam over  2 year ago a year ago. Has eye doctor Dr. Ellin Mayhew.  His brother Johnathan Hernandez died two weeks ago.   Patient also having GERD  , and describes  Recurrent episodes  Of postprandial abdominal pain and has started taking omeprazole. The pain does not occur daily and there has been no unintentional weight loss,.  He does not look at his stools and cannot report the color.  He is requesting testing for H Pylori, since "that's what killed my brother."   Quit smoking in 2006.    No prior  colon CA screening. Wants to do cologuard  Test.   Previous ER visit in Sept11mer  for syncope reviewed with patient.  His syncope was  attributed to 'dehydration" per patient and has not recurred/.  Since then he has been increasing his liquid intake.  He reports  4-5 voids per day.  voiding 1-2 times per night .  drinks 2 16 ounce bottles of water ,  8 to 16 ounces of 2% milk and coffee with splenda.  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  He notes incresaed seating in the summer months.       Outpatient Prescriptions Prior to Visit  Medication Sig Dispense Refill  . aspirin EC 81 MG tablet Take 81 mg by mouth once.    . Cholecalciferol (VITAMIN D3) 2000 UNITS TABS Take by mouth.    . docusate sodium (STOOL SOFTENER) 100 MG capsule Take 100 mg by mouth once.    . enalapril (VASOTEC) 2.5 MG tablet TAKE ONE TABLET BY MOUTH ONCE DAILY 30 tablet 2  . fish oil-omega-3 fatty acids 1000 MG capsule Take 2 g by mouth daily.    . fluticasone (FLONASE) 50 MCG/ACT nasal spray Place 2 sprays into both nostrils daily. 16 g 6  . gabapentin (NEURONTIN) 600 MG tablet Take 1,200 mg by mouth 3 (three) times daily.    .Marland KitchenglipiZIDE (GLUCOTROL) 10 MG tablet TAKE ONE TABLET BY MOUTH TWICE DAILY BEFORE  A  MEAL 180 tablet 0  . glucose monitoring kit (FREESTYLE) monitoring kit 1 each by Does not apply route as needed for other. PATIENT TO CHOOSE THE GLUCOMETER OF HIS CHOICE 1  each 1  . HYDROmorphone (DILAUDID) 4 MG tablet Take 8 mg by mouth 3 (three) times daily.     . metFORMIN (GLUCOPHAGE) 1000 MG tablet TAKE ONE TABLET BY MOUTH TWICE DAILY WITH MEALS 60 tablet 5  . Multiple Vitamin (MULTIVITAMIN) tablet Take 1 tablet by mouth daily.    . nortriptyline (PAMELOR) 10 MG capsule Take 20 mg by mouth.    . Omeprazole (RA OMEPRAZOLE) 20 MG TBEC Take 40 mg by mouth once.    . pravastatin (PRAVACHOL) 80 MG tablet TAKE ONE TABLET BY MOUTH ONCE DAILY *NEED  TO  CALL  AND  SCHEDULE  OFFICE  VISIT* 90 tablet 0  . rivaroxaban (XARELTO) 20 MG TABS tablet One tablet daily 30 tablet 0  . traMADol (ULTRAM) 50 MG tablet Take 50 mg by mouth as  needed.    . vitamin B-12 (CYANOCOBALAMIN) 1000 MCG tablet Take 1,000 mcg by mouth daily.    . ferrous sulfate 325 (65 FE) MG tablet Take 325 mg by mouth daily with breakfast. Reported on 11/23/2015    . metFORMIN (GLUCOPHAGE) 500 MG tablet Take 1 tablet (500 mg total) by mouth 2 (two) times daily with a meal. (Patient not taking: Reported on 11/23/2015) 180 tablet 3  . pregabalin (LYRICA) 75 MG capsule Take 200 mg by mouth 3 (three) times daily. Reported on 11/23/2015     No facility-administered medications prior to visit.    Review of Systems;  Patient denies headache, fevers, malaise, unintentional weight loss, skin rash, eye pain, sinus congestion and sinus pain, sore throat, dysphagia,  hemoptysis , cough, dyspnea, wheezing, chest pain, palpitations, orthopnea, edema, abdominal pain, nausea, melena, diarrhea, constipation, flank pain, dysuria, hematuria, urinary  Frequency, nocturia, numbness, tingling, seizures,  Focal weakness, Loss of consciousness,  Tremor, insomnia, depression, anxiety, and suicidal ideation.      Objective:  BP 128/72 mmHg  Pulse 88  Temp(Src) 98.4 F (36.9 C) (Oral)  Resp 12  Ht '5\' 11"'$  (1.803 m)  Wt 219 lb 8 oz (99.565 kg)  BMI 30.63 kg/m2  SpO2 98%  BP Readings from Last 3 Encounters:  11/23/15 128/72  09/03/15 128/72  03/28/15 156/80    Wt Readings from Last 3 Encounters:  11/23/15 219 lb 8 oz (99.565 kg)  09/03/15 224 lb 2 oz (101.662 kg)  03/28/15 215 lb (97.523 kg)    General appearance: alert, cooperative and appears stated age Ears: normal TM's and external ear canals both ears Throat: lips, mucosa, and tongue normal; teeth and gums normal Neck: no adenopathy, no carotid bruit, supple, symmetrical, trachea midline and thyroid not enlarged, symmetric, no tenderness/mass/nodules Back: symmetric, no curvature. ROM normal. No CVA tenderness. Lungs: clear to auscultation bilaterally Heart: regular rate and rhythm, S1, S2 normal, no murmur,  click, rub or gallop Abdomen: soft, non-tender; bowel sounds normal; no masses,  no organomegaly Pulses: 2+ and symmetric Skin: Skin color, texture, turgor normal. No rashes or lesions Lymph nodes: Cervical, supraclavicular, and axillary nodes normal.  Lab Results  Component Value Date   HGBA1C 10.8* 02/03/2015   HGBA1C CANCELED 03/28/2014   HGBA1C 8.6* 05/21/2013    Lab Results  Component Value Date   CREATININE 1.10 03/28/2015   CREATININE 1.09 02/03/2015   CREATININE 1.00 03/28/2014    Lab Results  Component Value Date   WBC 8.0 03/28/2015   HGB 12.9* 03/28/2015   HCT 40.0 03/28/2015   PLT 195 03/28/2015   GLUCOSE 185* 03/28/2015   CHOL 108 02/03/2015   TRIG  183.0* 02/03/2015   HDL 36.30* 02/03/2015   LDLDIRECT 108.1 02/12/2013   LDLCALC 35 02/03/2015   ALT 24 03/28/2015   AST 29 03/28/2015   NA 136 03/28/2015   K 4.2 03/28/2015   CL 104 03/28/2015   CREATININE 1.10 03/28/2015   BUN 11 03/28/2015   CO2 24 03/28/2015   TSH 0.66 09/17/2012   PSA 2.1 06/14/2012   HGBA1C 10.8* 02/03/2015   MICROALBUR 1.4 02/03/2015    No results found.  Assessment & Plan:   Problem List Items Addressed This Visit    Diabetes mellitus with atherosclerosis of arteries of extremities ([place])    Historically Uncontrolled due to dietary nd medication noncompliance,  Patient is non receptive to use of insulin , canot afford Janivia,  and often skips meals  Referral for diabetes education was advised after last visit in October but deferred by patient. He has not had labs in 10 months.  Asking patient to submit blood sugars every 2 weeks for medication changes. .  Lab Results  Component Value Date   HGBA1C 10.8* 02/03/2015   Lab Results  Component Value Date   MICROALBUR 1.4 02/03/2015           Diabetic polyneuropathy ([place])    He has loss of sensation, but other than long toenails,  His feet are normal appearing.       Noncompliance with medication treatment due to  intermittent use of medication    Patient has limited his options due to personal preference to avoid insulin and inability to afford any meds except glipizide and metfomrin.  Referral to Endocrinology in process.       Gastritis    He has increased concern about infrequent episodes of abdominal pain since his brother died of metastatic gastric cancer.  Given his family history and personal history , will check H pylori and consider referral for EGD        Other Visit Diagnoses    Uncontrolled type 2 diabetes mellitus with ketoacidosis without coma, unspecified long term insulin use status ([place])    -  Primary    Relevant Orders    Comprehensive metabolic panel    Hemoglobin A1c    Microalbumin / creatinine urine ratio    Hyperlipidemia LDL goal <100        Relevant Orders    LDL cholesterol, direct    Abdominal pain, chronic, epigastric        Relevant Orders    H. pylori antibody, IgG    CBC with Differential/Platelet    Screening for prostate cancer        Relevant Orders    PSA, Medicare      A total of 40 minutes was spent with patient more than half of which was spent in counseling patient on the above mentioned issues , reviewing and explaining recent labs and imaging studies done, and coordination of care. I have discontinued Mr. Ballinas pregabalin. I am also having him maintain his multivitamin, fish oil-omega-3 fatty acids, vitamin B-12, Vitamin D3, glucose monitoring kit, ferrous sulfate, HYDROmorphone, gabapentin, metFORMIN, traMADol, aspirin EC, docusate sodium, Omeprazole, glipiZIDE, enalapril, pravastatin, nortriptyline, fluticasone, and rivaroxaban.  No orders of the defined types were placed in this encounter.    Medications Discontinued During This Encounter  Medication Reason  . metFORMIN (GLUCOPHAGE) 500 MG tablet Change in therapy  . pregabalin (LYRICA) 75 MG capsule Change in therapy    Follow-up: Return in about 3 weeks (around 12/14/2015) for follow up  diabetes.   Crecencio Mc, MD

## 2015-11-23 NOTE — Progress Notes (Signed)
Pre-visit discussion using our clinic review tool. No additional management support is needed unless otherwise documented below in the visit note.  

## 2015-11-24 ENCOUNTER — Encounter: Payer: Self-pay | Admitting: Internal Medicine

## 2015-11-24 DIAGNOSIS — Z9114 Patient's other noncompliance with medication regimen: Secondary | ICD-10-CM | POA: Insufficient documentation

## 2015-11-24 DIAGNOSIS — K2970 Gastritis, unspecified, without bleeding: Secondary | ICD-10-CM | POA: Insufficient documentation

## 2015-11-24 DIAGNOSIS — Z91148 Patient's other noncompliance with medication regimen for other reason: Secondary | ICD-10-CM | POA: Insufficient documentation

## 2015-11-24 LAB — H. PYLORI ANTIBODY, IGG: H Pylori IgG: NEGATIVE

## 2015-11-24 LAB — COMPREHENSIVE METABOLIC PANEL
ALBUMIN: 4.5 g/dL (ref 3.5–5.2)
ALT: 27 U/L (ref 0–53)
AST: 23 U/L (ref 0–37)
Alkaline Phosphatase: 44 U/L (ref 39–117)
BUN: 15 mg/dL (ref 6–23)
CHLORIDE: 101 meq/L (ref 96–112)
CO2: 28 mEq/L (ref 19–32)
Calcium: 9.9 mg/dL (ref 8.4–10.5)
Creatinine, Ser: 1.24 mg/dL (ref 0.40–1.50)
GFR: 61.91 mL/min (ref >60.00)
Glucose, Bld: 216 mg/dL — ABNORMAL HIGH (ref 70–99)
POTASSIUM: 4.6 meq/L (ref 3.5–5.1)
SODIUM: 137 meq/L (ref 135–145)
Total Bilirubin: 0.3 mg/dL (ref 0.2–1.2)
Total Protein: 7.2 g/dL (ref 6.0–8.3)

## 2015-11-24 LAB — MICROALBUMIN / CREATININE URINE RATIO
CREATININE, U: 256.4 mg/dL
MICROALB UR: 4.9 mg/dL — AB (ref 0.0–1.9)
MICROALB/CREAT RATIO: 1.9 mg/g (ref 0.0–30.0)

## 2015-11-24 LAB — CBC WITH DIFFERENTIAL/PLATELET
BASOS PCT: 1.4 % (ref 0.0–3.0)
Basophils Absolute: 0.1 10*3/uL (ref 0.0–0.1)
EOS ABS: 0.3 10*3/uL (ref 0.0–0.7)
Eosinophils Relative: 3.7 % (ref 0.0–5.0)
HCT: 42.5 % (ref 39.0–52.0)
HEMOGLOBIN: 14 g/dL (ref 13.0–17.0)
LYMPHS ABS: 3 10*3/uL (ref 0.7–4.0)
Lymphocytes Relative: 34.6 % (ref 12.0–46.0)
MCHC: 33 g/dL (ref 30.0–36.0)
MCV: 84.8 fl (ref 78.0–100.0)
MONO ABS: 1 10*3/uL (ref 0.1–1.0)
Monocytes Relative: 11.6 % (ref 3.0–12.0)
NEUTROS ABS: 4.3 10*3/uL (ref 1.4–7.7)
NEUTROS PCT: 48.7 % (ref 43.0–77.0)
PLATELETS: 255 10*3/uL (ref 150.0–400.0)
RBC: 5.01 Mil/uL (ref 4.22–5.81)
RDW: 14.3 % (ref 11.5–15.5)
WBC: 8.8 10*3/uL (ref 4.0–10.5)

## 2015-11-24 LAB — LDL CHOLESTEROL, DIRECT: Direct LDL: 117 mg/dL

## 2015-11-24 LAB — HEMOGLOBIN A1C: HEMOGLOBIN A1C: 11.4 % — AB (ref 4.6–6.5)

## 2015-11-24 LAB — PSA, MEDICARE: PSA: 1.46 ng/ml (ref 0.10–4.00)

## 2015-11-24 NOTE — Assessment & Plan Note (Signed)
He has increased concern about infrequent episodes of abdominal pain since his brother died of metastatic gastric cancer.  Given his family history and personal history , will check H pylori and consider referral for EGD

## 2015-11-24 NOTE — Assessment & Plan Note (Signed)
Patient has limited his options due to personal preference to avoid insulin and inability to afford any meds except glipizide and metfomrin.  Referral to Endocrinology in process.

## 2015-11-24 NOTE — Addendum Note (Signed)
Addended by: Dennie BibleAVIS, KATHY R on: 11/24/2015 10:23 AM   Modules accepted: Orders

## 2015-11-24 NOTE — Assessment & Plan Note (Signed)
Historically Uncontrolled due to dietary nd medication noncompliance,  Patient is non receptive to use of insulin , canot afford Johnathan Hernandez,  and often skips meals  Referral for diabetes education was advised after last visit in October but deferred by patient. He has not had labs in 10 months.  Asking patient to submit blood sugars every 2 weeks for medication changes. .  Lab Results  Component Value Date   HGBA1C 10.8* 02/03/2015   Lab Results  Component Value Date   MICROALBUR 1.4 02/03/2015

## 2015-11-24 NOTE — Assessment & Plan Note (Signed)
He has loss of sensation, but other than long toenails,  His feet are normal appearing.

## 2015-11-26 DIAGNOSIS — M961 Postlaminectomy syndrome, not elsewhere classified: Secondary | ICD-10-CM | POA: Diagnosis not present

## 2015-11-26 DIAGNOSIS — M79604 Pain in right leg: Secondary | ICD-10-CM | POA: Diagnosis not present

## 2015-11-26 DIAGNOSIS — M79605 Pain in left leg: Secondary | ICD-10-CM | POA: Diagnosis not present

## 2015-11-26 DIAGNOSIS — G6289 Other specified polyneuropathies: Secondary | ICD-10-CM | POA: Diagnosis not present

## 2015-11-26 DIAGNOSIS — G8929 Other chronic pain: Secondary | ICD-10-CM | POA: Diagnosis not present

## 2015-11-26 DIAGNOSIS — G894 Chronic pain syndrome: Secondary | ICD-10-CM | POA: Diagnosis not present

## 2015-11-26 DIAGNOSIS — M545 Low back pain: Secondary | ICD-10-CM | POA: Diagnosis not present

## 2015-11-26 DIAGNOSIS — M25511 Pain in right shoulder: Secondary | ICD-10-CM | POA: Diagnosis not present

## 2015-11-26 DIAGNOSIS — Z79891 Long term (current) use of opiate analgesic: Secondary | ICD-10-CM | POA: Diagnosis not present

## 2015-11-30 ENCOUNTER — Other Ambulatory Visit: Payer: Self-pay | Admitting: Internal Medicine

## 2015-11-30 NOTE — Telephone Encounter (Signed)
LVTCB to see if he has been taking medication has prescribed.

## 2015-12-04 ENCOUNTER — Encounter: Payer: Self-pay | Admitting: *Deleted

## 2015-12-11 ENCOUNTER — Other Ambulatory Visit: Payer: Self-pay | Admitting: Internal Medicine

## 2015-12-27 ENCOUNTER — Other Ambulatory Visit: Payer: Self-pay | Admitting: Internal Medicine

## 2016-01-04 ENCOUNTER — Telehealth: Payer: Self-pay | Admitting: Internal Medicine

## 2016-01-04 NOTE — Telephone Encounter (Signed)
Blood sugars reviewed.  Needs to start checking 2 hr post meal sugars instead of fasting and bedtime

## 2016-01-05 NOTE — Telephone Encounter (Signed)
Patient notified and voiced understanding.

## 2016-01-13 ENCOUNTER — Other Ambulatory Visit: Payer: Self-pay | Admitting: Internal Medicine

## 2016-02-04 ENCOUNTER — Telehealth: Payer: Self-pay | Admitting: Internal Medicine

## 2016-02-04 NOTE — Telephone Encounter (Signed)
Post prandial Blood sugars reviewed, and they are very high.  He is maxed out on glipizide.  he will either have to start insulin or  Januvia.  If he is still unwilling to start either, I will refer him to our endocrinologist because I have no other options

## 2016-02-09 DIAGNOSIS — M25511 Pain in right shoulder: Secondary | ICD-10-CM | POA: Diagnosis not present

## 2016-02-09 DIAGNOSIS — M79604 Pain in right leg: Secondary | ICD-10-CM | POA: Diagnosis not present

## 2016-02-09 DIAGNOSIS — G894 Chronic pain syndrome: Secondary | ICD-10-CM | POA: Diagnosis not present

## 2016-02-09 DIAGNOSIS — I739 Peripheral vascular disease, unspecified: Secondary | ICD-10-CM | POA: Diagnosis not present

## 2016-02-09 DIAGNOSIS — M545 Low back pain: Secondary | ICD-10-CM | POA: Diagnosis not present

## 2016-02-09 DIAGNOSIS — G6289 Other specified polyneuropathies: Secondary | ICD-10-CM | POA: Diagnosis not present

## 2016-02-09 DIAGNOSIS — Z7982 Long term (current) use of aspirin: Secondary | ICD-10-CM | POA: Diagnosis not present

## 2016-02-09 DIAGNOSIS — G8929 Other chronic pain: Secondary | ICD-10-CM | POA: Diagnosis not present

## 2016-02-09 DIAGNOSIS — Z79891 Long term (current) use of opiate analgesic: Secondary | ICD-10-CM | POA: Diagnosis not present

## 2016-02-09 DIAGNOSIS — M129 Arthropathy, unspecified: Secondary | ICD-10-CM | POA: Diagnosis not present

## 2016-02-09 DIAGNOSIS — M961 Postlaminectomy syndrome, not elsewhere classified: Secondary | ICD-10-CM | POA: Diagnosis not present

## 2016-02-09 NOTE — Telephone Encounter (Signed)
Left message for patient to return call to office. 

## 2016-02-15 NOTE — Telephone Encounter (Signed)
Left message for patient to return call to office. 

## 2016-02-17 NOTE — Telephone Encounter (Signed)
Patient willing to try Januvia patient finally returned call.

## 2016-02-17 NOTE — Telephone Encounter (Signed)
Patient stated that he will be at phone number [phone]

## 2016-02-17 NOTE — Telephone Encounter (Signed)
Left message for patient to return call to office. 

## 2016-02-18 MED ORDER — SITAGLIPTIN PHOSPHATE 100 MG PO TABS: 100.0000 mg | ORAL_TABLET | Freq: Every day | ORAL | Status: DC

## 2016-02-18 NOTE — Telephone Encounter (Signed)
rx for [place] sent to pharmacy

## 2016-02-18 NOTE — Telephone Encounter (Signed)
Patient notified

## 2016-04-18 ENCOUNTER — Other Ambulatory Visit: Payer: Self-pay | Admitting: Internal Medicine

## 2016-05-18 ENCOUNTER — Other Ambulatory Visit: Payer: Self-pay | Admitting: Internal Medicine

## 2016-05-20 NOTE — Telephone Encounter (Signed)
Okay to refill ? Pt last seen in April. No future appt scheduled. Last filled on 01/13/16 for #30 with 3 refills.

## 2016-05-22 NOTE — Telephone Encounter (Signed)
90 day supply authorized and sent . Please schedule office visit 6 month follow up needed.

## 2016-05-23 ENCOUNTER — Encounter: Payer: Self-pay | Admitting: *Deleted

## 2016-05-23 NOTE — Telephone Encounter (Signed)
Mailed letter to patient

## 2016-05-27 DIAGNOSIS — G6289 Other specified polyneuropathies: Secondary | ICD-10-CM | POA: Diagnosis not present

## 2016-05-27 DIAGNOSIS — G894 Chronic pain syndrome: Secondary | ICD-10-CM | POA: Diagnosis not present

## 2016-05-27 DIAGNOSIS — E1169 Type 2 diabetes mellitus with other specified complication: Secondary | ICD-10-CM | POA: Diagnosis not present

## 2016-05-27 DIAGNOSIS — M79604 Pain in right leg: Secondary | ICD-10-CM | POA: Diagnosis not present

## 2016-05-27 DIAGNOSIS — M25511 Pain in right shoulder: Secondary | ICD-10-CM | POA: Diagnosis not present

## 2016-05-27 DIAGNOSIS — M545 Low back pain: Secondary | ICD-10-CM | POA: Diagnosis not present

## 2016-05-27 DIAGNOSIS — M961 Postlaminectomy syndrome, not elsewhere classified: Secondary | ICD-10-CM | POA: Diagnosis not present

## 2016-05-27 DIAGNOSIS — M1612 Unilateral primary osteoarthritis, left hip: Secondary | ICD-10-CM | POA: Diagnosis not present

## 2016-05-27 DIAGNOSIS — I739 Peripheral vascular disease, unspecified: Secondary | ICD-10-CM | POA: Diagnosis not present

## 2016-06-29 ENCOUNTER — Other Ambulatory Visit: Payer: Self-pay | Admitting: Internal Medicine

## 2016-08-22 DIAGNOSIS — M545 Low back pain: Secondary | ICD-10-CM | POA: Diagnosis not present

## 2016-08-22 DIAGNOSIS — M79605 Pain in left leg: Secondary | ICD-10-CM | POA: Diagnosis not present

## 2016-08-22 DIAGNOSIS — M1612 Unilateral primary osteoarthritis, left hip: Secondary | ICD-10-CM | POA: Diagnosis not present

## 2016-08-22 DIAGNOSIS — Z79891 Long term (current) use of opiate analgesic: Secondary | ICD-10-CM | POA: Diagnosis not present

## 2016-08-22 DIAGNOSIS — G894 Chronic pain syndrome: Secondary | ICD-10-CM | POA: Diagnosis not present

## 2016-08-22 DIAGNOSIS — G8929 Other chronic pain: Secondary | ICD-10-CM | POA: Diagnosis not present

## 2016-08-22 DIAGNOSIS — M79604 Pain in right leg: Secondary | ICD-10-CM | POA: Diagnosis not present

## 2016-08-22 DIAGNOSIS — L600 Ingrowing nail: Secondary | ICD-10-CM | POA: Diagnosis not present

## 2016-08-22 DIAGNOSIS — M25511 Pain in right shoulder: Secondary | ICD-10-CM | POA: Diagnosis not present

## 2016-08-22 DIAGNOSIS — M961 Postlaminectomy syndrome, not elsewhere classified: Secondary | ICD-10-CM | POA: Diagnosis not present

## 2016-09-14 ENCOUNTER — Telehealth: Payer: Self-pay | Admitting: Internal Medicine

## 2016-09-14 NOTE — Telephone Encounter (Signed)
Pt called requesting a refill on his XARELTO 20 MG TABS tablet. He only has a weeks worth of medication left. He is scheduled for 10/19/16. Please advise, thank you!  Call pt @ [phone]  Pharmacy - [facility] [address] - [place], [place] - [phone]

## 2016-09-15 MED ORDER — RIVAROXABAN 20 MG PO TABS: 20.0000 mg | ORAL_TABLET | Freq: Every day | ORAL | 0 refills | Status: DC

## 2016-09-15 NOTE — Telephone Encounter (Signed)
Refill sent for enough to last until his 10/20/16 OV with PCP. Pt's wife informed.  See meds.

## 2016-09-22 ENCOUNTER — Telehealth: Payer: Self-pay | Admitting: Internal Medicine

## 2016-09-22 NOTE — Telephone Encounter (Signed)
I called pt and left a vm to call the office to sch AWV. Thank you! °

## 2016-10-02 ENCOUNTER — Other Ambulatory Visit: Payer: Self-pay | Admitting: Internal Medicine

## 2016-10-02 DIAGNOSIS — I70209 Unspecified atherosclerosis of native arteries of extremities, unspecified extremity: Principal | ICD-10-CM

## 2016-10-02 DIAGNOSIS — I10 Essential (primary) hypertension: Secondary | ICD-10-CM

## 2016-10-02 DIAGNOSIS — E1151 Type 2 diabetes mellitus with diabetic peripheral angiopathy without gangrene: Secondary | ICD-10-CM

## 2016-10-02 DIAGNOSIS — E785 Hyperlipidemia, unspecified: Secondary | ICD-10-CM

## 2016-10-02 DIAGNOSIS — E1159 Type 2 diabetes mellitus with other circulatory complications: Secondary | ICD-10-CM

## 2016-10-02 DIAGNOSIS — I152 Hypertension secondary to endocrine disorders: Secondary | ICD-10-CM

## 2016-10-03 NOTE — Telephone Encounter (Signed)
Pt last refill for Vasotec was 09/27/16. Pt last OV and labs were on 11/23/15. Pt next appt was on 10/20/16. Ok to refill?

## 2016-10-03 NOTE — Telephone Encounter (Signed)
No refill until labs are done. Fasting labs done

## 2016-10-05 ENCOUNTER — Other Ambulatory Visit: Payer: Self-pay | Admitting: Family Medicine

## 2016-10-19 ENCOUNTER — Ambulatory Visit: Payer: Self-pay | Admitting: Internal Medicine

## 2016-10-20 ENCOUNTER — Ambulatory Visit (INDEPENDENT_AMBULATORY_CARE_PROVIDER_SITE_OTHER): Payer: Commercial Managed Care - HMO | Admitting: Internal Medicine

## 2016-10-20 ENCOUNTER — Encounter: Payer: Self-pay | Admitting: Internal Medicine

## 2016-10-20 DIAGNOSIS — E1151 Type 2 diabetes mellitus with diabetic peripheral angiopathy without gangrene: Secondary | ICD-10-CM | POA: Diagnosis not present

## 2016-10-20 DIAGNOSIS — E785 Hyperlipidemia, unspecified: Secondary | ICD-10-CM

## 2016-10-20 DIAGNOSIS — Z86711 Personal history of pulmonary embolism: Secondary | ICD-10-CM | POA: Diagnosis not present

## 2016-10-20 DIAGNOSIS — I70209 Unspecified atherosclerosis of native arteries of extremities, unspecified extremity: Secondary | ICD-10-CM | POA: Diagnosis not present

## 2016-10-20 DIAGNOSIS — I739 Peripheral vascular disease, unspecified: Secondary | ICD-10-CM | POA: Diagnosis not present

## 2016-10-20 DIAGNOSIS — I10 Essential (primary) hypertension: Secondary | ICD-10-CM

## 2016-10-20 DIAGNOSIS — E1159 Type 2 diabetes mellitus with other circulatory complications: Secondary | ICD-10-CM | POA: Diagnosis not present

## 2016-10-20 DIAGNOSIS — E1142 Type 2 diabetes mellitus with diabetic polyneuropathy: Secondary | ICD-10-CM

## 2016-10-20 LAB — COMPREHENSIVE METABOLIC PANEL
ALT: 27 U/L (ref 0–53)
AST: 24 U/L (ref 0–37)
Albumin: 4.4 g/dL (ref 3.5–5.2)
Alkaline Phosphatase: 38 U/L — ABNORMAL LOW (ref 39–117)
BUN: 15 mg/dL (ref 6–23)
CALCIUM: 9.6 mg/dL (ref 8.4–10.5)
CHLORIDE: 100 meq/L (ref 96–112)
CO2: 29 meq/L (ref 19–32)
CREATININE: 1.02 mg/dL (ref 0.40–1.50)
GFR: 77.35 mL/min (ref >60.00)
GLUCOSE: 268 mg/dL — AB (ref 70–99)
Potassium: 4.3 mEq/L (ref 3.5–5.1)
SODIUM: 136 meq/L (ref 135–145)
Total Bilirubin: 0.4 mg/dL (ref 0.2–1.2)
Total Protein: 6.9 g/dL (ref 6.0–8.3)

## 2016-10-20 LAB — LDL CHOLESTEROL, DIRECT: Direct LDL: 85 mg/dL

## 2016-10-20 LAB — LIPID PANEL
CHOL/HDL RATIO: 4
Cholesterol: 161 mg/dL (ref 0–200)
HDL: 41.7 mg/dL (ref >39.00)
NONHDL: 119.33
TRIGLYCERIDES: 244 mg/dL — AB (ref 0.0–149.0)
VLDL: 48.8 mg/dL — ABNORMAL HIGH (ref 0.0–40.0)

## 2016-10-20 LAB — MICROALBUMIN / CREATININE URINE RATIO
Creatinine,U: 126.4 mg/dL
MICROALB UR: 2.7 mg/dL — AB (ref 0.0–1.9)
Microalb Creat Ratio: 2.1 mg/g (ref 0.0–30.0)

## 2016-10-20 LAB — POCT GLYCOSYLATED HEMOGLOBIN (HGB A1C): HEMOGLOBIN A1C: 10

## 2016-10-20 MED ORDER — SITAGLIPTIN PHOSPHATE 50 MG PO TABS: 50.0000 mg | ORAL_TABLET | Freq: Every day | ORAL | 0 refills | Status: DC

## 2016-10-20 MED ORDER — RIVAROXABAN 20 MG PO TABS: 20.0000 mg | ORAL_TABLET | Freq: Every day | ORAL | 2 refills | Status: DC

## 2016-10-20 MED ORDER — ENALAPRIL MALEATE 2.5 MG PO TABS: 2.5000 mg | ORAL_TABLET | Freq: Every day | ORAL | 2 refills | Status: DC

## 2016-10-20 NOTE — Patient Instructions (Addendum)
Your diabetes is still OUT OF CONTROL.    I AM REFERRING YOU TO A DIABETES SPECIALIST AT [facility] .  TO HELP GET YORU DIABETS UNDER CONTROL   I HAVE SENT prescriptions for Januvia  50 MG TO [facility].  Today  YOU ARE NOT EATING THEY WAY A PERSON WITH DIABETES SHOULD EAT  (TOO MANY CARBS daily )    You  Should  try a premixed protein drink called Premier Protein shake in the morning OR THE Atkins shakes  It is less $$$ and very low sugar, or eat eggs.   Please stop eating bananas and potatoes every day.   Stop eating the pack of nabs, too.   Avoid bagels and oatmeal(All are high sugar).    Leg cramps can be treated with a teaspoon of mustard  Avoid corn,  It is high in sugars   Eat more chicken,  Fish, lean meats.   And green vegetables.    Blood Glucose Monitoring, Adult Monitoring your blood sugar (glucose) helps you manage your diabetes. It also helps you and your health care provider determine how well your diabetes management plan is working. Blood glucose monitoring involves checking your blood glucose as often as directed, and keeping a record (log) of your results over time. Why should I monitor my blood glucose? Checking your blood glucose regularly can:  Help you understand how food, exercise, illnesses, and medicines affect your blood glucose.  Let you know what your blood glucose is at any time. You can quickly tell if you are having low blood glucose (hypoglycemia) or high blood glucose (hyperglycemia).  Help you and your health care provider adjust your medicines as needed. When should I check my blood glucose? Follow instructions from your health care provider about how often to check your blood glucose. This Deliz depend on:  The type of diabetes you have.  How well-controlled your diabetes is.  Medicines you are taking. If you have type 1 diabetes:   Check your blood glucose at least 2 times a day.  Also check your blood glucose:  Before every insulin  injection.  Before and after exercise.  Between meals.  2 hours after a meal.  Occasionally between 2:00 a.m. and 3:00 a.m., as directed.  Before potentially dangerous tasks, like driving or using heavy machinery.  At bedtime.  You Wold need to check your blood glucose more often, up to 6-10 times a day:  If you use an insulin pump.  If you need multiple daily injections (MDI).  If your diabetes is not well-controlled.  If you are ill.  If you have a history of severe hypoglycemia.  If you have a history of not knowing when your blood glucose is getting low (hypoglycemia unawareness). If you have type 2 diabetes:   If you take insulin or other diabetes medicines, check your blood glucose at least 2 times a day.  If you are on intensive insulin therapy, check your blood glucose at least 4 times a day. Occasionally, you [place] also need to check between 2:00 a.m. and 3:00 a.m., as directed.  Also check your blood glucose:  Before and after exercise.  Before potentially dangerous tasks, like driving or using heavy machinery.  You [place] need to check your blood glucose more often if:  Your medicine is being adjusted.  Your diabetes is not well-controlled.  You are ill. What is a blood glucose log?  A blood glucose log is a record of your blood glucose readings.  It helps you and your health care provider:  Look for patterns in your blood glucose over time.  Adjust your diabetes management plan as needed.  Every time you check your blood glucose, write down your result and notes about things that Narayan be affecting your blood glucose, such as your diet and exercise for the day.  Most glucose meters store a record of glucose readings in the meter. Some meters allow you to download your records to a computer. How do I check my blood glucose? Follow these steps to get accurate readings of your blood glucose: Supplies needed    Blood glucose meter.  Test strips for your  meter. Each meter has its own strips. You must use the strips that come with your meter.  A needle to prick your finger (lancet). Do not use lancets more than once.  A device that holds the lancet (lancing device).  A journal or log book to write down your results. Procedure   Wash your hands with soap and water.  Prick the side of your finger (not the tip) with the lancet. Use a different finger each time.  Gently rub the finger until a small drop of blood appears.  Follow instructions that come with your meter for inserting the test strip, applying blood to the strip, and using your blood glucose meter.  Write down your result and any notes. Alternative testing sites   Some meters allow you to use areas of your body other than your finger (alternative sites) to test your blood.  If you think you [place] have hypoglycemia, or if you have hypoglycemia unawareness, do not use alternative sites. Use your finger instead.  Alternative sites Walters not be as accurate as the fingers, because blood flow is slower in these areas. This means that the result you get Lue[place] be delayed, and it [place] be different from the result that you would get from your finger.  The most common alternative sites are:  Forearm.  Thigh.  Palm of the hand. Additional tips   Always keep your supplies with you.  If you have questions or need help, all blood glucose meters have a 24-hour "hotline" number that you can call. You Critz also contact your health care provider.  After you use a few boxes of test strips, adjust (calibrate) your blood glucose meter by following instructions that came with your meter. This information is not intended to replace advice given to you by your health care provider. Make sure you discuss any questions you have with your health care provider. Document Released: 08/04/2003 Document Revised: 02/19/2016 Document Reviewed: 01/11/2016 Elsevier Interactive Patient Education  2017 [employer]

## 2016-10-20 NOTE — Progress Notes (Signed)
Pre visit review using our clinic review tool, if applicable. No additional management support is needed unless otherwise documented below in the visit note. 

## 2016-10-20 NOTE — Progress Notes (Signed)
Subjective:  Patient ID: Johnathan Hernandez, male    DOB: 1951/04/06  Age: 66 y.o. MRN: [id number]  CC: Diagnoses of Diabetes mellitus with atherosclerosis of arteries of extremities ([place]), Hypertension associated with diabetes ([place]), Hyperlipidemia with target LDL less than 70, Diabetic polyneuropathy associated with type 2 diabetes mellitus ([place]), History of pulmonary embolism, and Peripheral vascular disease ([place]) were pertinent to this visit.  HPI Johnathan Hernandez presents for FOLLOW P ON UNCONTROLLED TYPE 2 DM and related issues  He was last seen in April 2017 and his A1c was 11.4.  He was called repeatedly to advise referral to endocrinology.  After ten days a letter was sent since he would not return the call.  NO record that he ever consented to referral.   He was advised in June to start Januvia and rx was sent to pharmacy .  In October he was mailed a reminder letter and to schedule an appointment ASAP and a courtesy refill on meds was given.   Patient states that  he spoke with someone in [place] to  "go ahead with the endocrinology referral" and that the Celesta Gentile was never received by pharmacy in June.   Has been taking only metformin and  Glipizide since April 2017.   FASTING SUGARS are checked at 10 AM and have been elevated,  Usually in the 150 TO 175,  OCCASIONALLY 200 .  CHECKING IN EVENINGS  BEFORE SUPPER 150    Diet reviewed:  He eats white potatoes,  bananas and a "pack of nabs"  every day . Eats a sandwhich most days as well .Bagrels or oatmeal for a full breakfast,  corn often.   Not  exercising due to chronic pain .    Foot exam done,  Decreased senation over heel of left foot  Has referred neuropathic pain from low back issues.     Lab Results  Component Value Date   HGBA1C 10.0 10/20/2016      Outpatient Medications Prior to Visit  Medication Sig Dispense Refill  . aspirin EC 81 MG tablet Take 81 mg by mouth once.    . Cholecalciferol (VITAMIN D3) 2000 UNITS TABS Take by  mouth.    . docusate sodium (STOOL SOFTENER) 100 MG capsule Take 100 mg by mouth once.    . ferrous sulfate 325 (65 FE) MG tablet Take 325 mg by mouth daily with breakfast. Reported on 11/23/2015    . fish oil-omega-3 fatty acids 1000 MG capsule Take 2 g by mouth daily.    . fluticasone (FLONASE) 50 MCG/ACT nasal spray USE TWO SPRAY(S) IN EACH NOSTRIL ONCE DAILY 16 g 6  . gabapentin (NEURONTIN) 600 MG tablet Take 1,200 mg by mouth 3 (three) times daily.    [name] glipiZIDE (GLUCOTROL) 10 MG tablet TAKE ONE TABLET BY MOUTH TWICE DAILY BEFORE A MEAL 180 tablet 1  . glucose monitoring kit (FREESTYLE) monitoring kit 1 each by Does not apply route as needed for other. PATIENT TO CHOOSE THE GLUCOMETER OF HIS CHOICE 1 each 1  . metFORMIN (GLUCOPHAGE) 1000 MG tablet TAKE ONE TABLET BY MOUTH TWICE DAILY WITH MEALS 60 tablet 5  . Multiple Vitamin (MULTIVITAMIN) tablet Take 1 tablet by mouth daily.    . Omeprazole (RA OMEPRAZOLE) 20 MG TBEC Take 40 mg by mouth once.    . vitamin B-12 (CYANOCOBALAMIN) 1000 MCG tablet Take 1,000 mcg by mouth daily.    . enalapril (VASOTEC) 2.5 MG tablet TAKE ONE TABLET BY MOUTH ONCE DAILY  30 tablet 2  . HYDROmorphone (DILAUDID) 4 MG tablet Take 8 mg by mouth 3 (three) times daily.     . metFORMIN (GLUCOPHAGE) 500 MG tablet TAKE ONE TABLET BY MOUTH TWICE DAILY WITH MEALS 180 tablet 1  . pravastatin (PRAVACHOL) 80 MG tablet Take 1 tablet (80 mg total) by mouth daily. 90 tablet 1  . rivaroxaban (XARELTO) 20 MG TABS tablet Take 1 tablet (20 mg total) by mouth daily. 35 tablet 0  . sitaGLIPtin (JANUVIA) 100 MG tablet Take 1 tablet (100 mg total) by mouth daily. (Patient not taking: Reported on 10/20/2016) 30 tablet 5  . traMADol (ULTRAM) 50 MG tablet Take 50 mg by mouth as needed.     No facility-administered medications prior to visit.     Review of Systems;  Patient denies headache, fevers, malaise, unintentional weight loss, skin rash, eye pain, sinus congestion and sinus pain,  sore throat, dysphagia,  hemoptysis , cough, dyspnea, wheezing, chest pain, palpitations, orthopnea, edema, abdominal pain, nausea, melena, diarrhea, constipation, flank pain, dysuria, hematuria, urinary  Frequency, nocturia, numbness, tingling, seizures,  Focal weakness, Loss of consciousness,  Tremor, insomnia, depression, anxiety, and suicidal ideation.      Objective:  BP (!) 142/80   Pulse 90   Temp 98.4 F (36.9 C) (Oral)   Resp 16   Wt 218 lb (98.9 kg)   SpO2 97%   BMI 30.40 kg/m   BP Readings from Last 3 Encounters:  10/20/16 (!) 142/80  11/23/15 128/72  09/03/15 128/72    Wt Readings from Last 3 Encounters:  10/20/16 218 lb (98.9 kg)  11/23/15 219 lb 8 oz (99.6 kg)  09/03/15 224 lb 2 oz (101.7 kg)    General appearance: alert, cooperative and appears stated age Ears: normal TM's and external ear canals both ears Throat: lips, mucosa, and tongue normal; teeth and gums normal Neck: no adenopathy, no carotid bruit, supple, symmetrical, trachea midline and thyroid not enlarged, symmetric, no tenderness/mass/nodules Back: symmetric, no curvature. ROM normal. No CVA tenderness. Lungs: clear to auscultation bilaterally Heart: regular rate and rhythm, S1, S2 normal, no murmur, click, rub or gallop Abdomen: soft, non-tender; bowel sounds normal; no masses,  no organomegaly Pulses: 2+ and symmetric Skin: Skin color, texture, turgor normal. No rashes or lesions Lymph nodes: Cervical, supraclavicular, and axillary nodes normal.  Lab Results  Component Value Date   HGBA1C 10.0 10/20/2016   HGBA1C 11.4 (H) 11/23/2015   HGBA1C 10.8 (H) 02/03/2015    Lab Results  Component Value Date   CREATININE 1.02 10/20/2016   CREATININE 1.24 11/23/2015   CREATININE 1.10 03/28/2015    Lab Results  Component Value Date   WBC 8.8 11/23/2015   HGB 14.0 11/23/2015   HCT 42.5 11/23/2015   PLT 255.0 11/23/2015   GLUCOSE 268 (H) 10/20/2016   CHOL 161 10/20/2016   TRIG 244.0 (H)  10/20/2016   HDL 41.70 10/20/2016   LDLDIRECT 85.0 10/20/2016   LDLCALC 35 02/03/2015   ALT 27 10/20/2016   AST 24 10/20/2016   NA 136 10/20/2016   K 4.3 10/20/2016   CL 100 10/20/2016   CREATININE 1.02 10/20/2016   BUN 15 10/20/2016   CO2 29 10/20/2016   TSH 0.66 09/17/2012   PSA 1.46 11/23/2015   HGBA1C 10.0 10/20/2016   MICROALBUR 2.7 (H) 10/20/2016    No results found.  Assessment & Plan:   Problem List Items Addressed This Visit    Diabetes mellitus with atherosclerosis of arteries of extremities ([place])  He remains uncontrolled due to dietary and medication noncompliance,  Patient is non receptive to use of insulin , canot afford Januvia,  and often skips meals  Referral for diabetes education was advised after last visit in October but deferred by patient. He has not had labs in 10 months.  Referring to endocrinology today  And Januvia resent to pharmacy.  Should consider Jardiance given his history of CAD and PAD but deferring any future medication changes to endocrinology. Continue asa,  Statin and ACE INhibitor .  Will changeto a higher potency statin  For secondary prevention of CAD  Lab Results  Component Value Date   HGBA1C 10.0 10/20/2016   Lab Results  Component Value Date   MICROALBUR 2.7 (H) 10/20/2016           Relevant Medications   rivaroxaban (XARELTO) 20 MG TABS tablet   sitaGLIPtin (JANUVIA) 50 MG tablet   enalapril (VASOTEC) 5 MG tablet   atorvastatin (LIPITOR) 80 MG tablet   Other Relevant Orders   POCT glycosylated hemoglobin (Hb A1C) (Completed)   Ambulatory referral to Endocrinology   Diabetic polyneuropathy ([place])    He has loss of sensation on the heels , but other than long toenails,  His feet are normal appearing.       Relevant Medications   sitaGLIPtin (JANUVIA) 50 MG tablet   enalapril (VASOTEC) 5 MG tablet   atorvastatin (LIPITOR) 80 MG tablet   History of pulmonary embolism    Recurrent,  2nd on in Nov 2013 with DVT.   Continue lifelong anticoagulation with xarelto       Hyperlipidemia with target LDL less than 70    Changing to high potency statin for 10 yr risk of  CAD approaching 50%  Lab Results  Component Value Date   CHOL 161 10/20/2016   HDL 41.70 10/20/2016   LDLCALC 35 02/03/2015   LDLDIRECT 85.0 10/20/2016   TRIG 244.0 (H) 10/20/2016   CHOLHDL 4 10/20/2016         Relevant Medications   rivaroxaban (XARELTO) 20 MG TABS tablet   enalapril (VASOTEC) 5 MG tablet   atorvastatin (LIPITOR) 80 MG tablet   Hypertension associated with diabetes ([place])    Not well controlled on current regimen on enalapril 2.5 mg will increase  Dose to 5 mg daily  Lab Results  Component Value Date   CREATININE 1.02 10/20/2016   Lab Results  Component Value Date   NA 136 10/20/2016   K 4.3 10/20/2016   CL 100 10/20/2016   CO2 29 10/20/2016   Lab Results  Component Value Date   MICROALBUR 2.7 (H) 10/20/2016         Relevant Medications   rivaroxaban (XARELTO) 20 MG TABS tablet   sitaGLIPtin (JANUVIA) 50 MG tablet   enalapril (VASOTEC) 5 MG tablet   atorvastatin (LIPITOR) 80 MG tablet   Peripheral vascular disease ([place])    History of recurrent DVT/PE.  Has been taking Xarelto since his last event November 2013      Relevant Medications   rivaroxaban (XARELTO) 20 MG TABS tablet   enalapril (VASOTEC) 5 MG tablet   atorvastatin (LIPITOR) 80 MG tablet      I have discontinued Mr. Wojtkiewicz traMADol, pravastatin, sitaGLIPtin, and enalapril. I have also changed his enalapril. Additionally, I am having him start on sitaGLIPtin and atorvastatin. Lastly, I am having him maintain his multivitamin, fish oil-omega-3 fatty acids, vitamin B-12, Vitamin D3, glucose monitoring kit, ferrous sulfate, gabapentin, metFORMIN, aspirin EC,  docusate sodium, Omeprazole, glipiZIDE, fluticasone, HYDROmorphone, and rivaroxaban.  Meds ordered this encounter  Medications  . HYDROmorphone (DILAUDID) 8 MG tablet    Sig:  Take 8 mg by mouth 3 (three) times daily.  . rivaroxaban (XARELTO) 20 MG TABS tablet    Sig: Take 1 tablet (20 mg total) by mouth daily.    Dispense:  30 tablet    Refill:  2  . DISCONTD: enalapril (VASOTEC) 2.5 MG tablet    Sig: Take 1 tablet (2.5 mg total) by mouth daily.    Dispense:  30 tablet    Refill:  2    Please consider 90 day supplies to promote better adherence  . sitaGLIPtin (JANUVIA) 50 MG tablet    Sig: Take 1 tablet (50 mg total) by mouth daily.    Dispense:  90 tablet    Refill:  0  . enalapril (VASOTEC) 5 MG tablet    Sig: Take 1 tablet (5 mg total) by mouth daily.    Dispense:  90 tablet    Refill:  0    NOTE DOSE CHANGE TO 5 MG  . atorvastatin (LIPITOR) 80 MG tablet    Sig: Take 1 tablet (80 mg total) by mouth daily.    Dispense:  90 tablet    Refill:  0    Medications Discontinued During This Encounter  Medication Reason  . HYDROmorphone (DILAUDID) 4 MG tablet Dose change  . sitaGLIPtin (JANUVIA) 100 MG tablet Patient has not taken in last 30 days  . traMADol (ULTRAM) 50 MG tablet Patient has not taken in last 30 days  . metFORMIN (GLUCOPHAGE) 500 MG tablet Patient has not taken in last 30 days  . rivaroxaban (XARELTO) 20 MG TABS tablet Reorder  . enalapril (VASOTEC) 2.5 MG tablet Reorder  . enalapril (VASOTEC) 2.5 MG tablet Reorder  . pravastatin (PRAVACHOL) 80 MG tablet     Follow-up: No Follow-up on file.   Crecencio Mc, MD

## 2016-10-23 ENCOUNTER — Telehealth: Payer: Self-pay | Admitting: Internal Medicine

## 2016-10-23 MED ORDER — ENALAPRIL MALEATE 5 MG PO TABS: 5.0000 mg | ORAL_TABLET | Freq: Every day | ORAL | 0 refills | Status: DC

## 2016-10-23 MED ORDER — ATORVASTATIN CALCIUM 80 MG PO TABS: 80.0000 mg | ORAL_TABLET | Freq: Every day | ORAL | 0 refills | Status: DC

## 2016-10-23 NOTE — Assessment & Plan Note (Signed)
Recurrent,  2nd on in Nov 2013 with DVT.  Continue lifelong anticoagulation with xarelto

## 2016-10-23 NOTE — Assessment & Plan Note (Signed)
He has loss of sensation on the heels , but other than long toenails,  His feet are normal appearing.

## 2016-10-23 NOTE — Assessment & Plan Note (Signed)
Not well controlled on current regimen on enalapril 2.5 mg will increase  Dose to 5 mg daily  Lab Results  Component Value Date   CREATININE 1.02 10/20/2016   Lab Results  Component Value Date   NA 136 10/20/2016   K 4.3 10/20/2016   CL 100 10/20/2016   CO2 29 10/20/2016   Lab Results  Component Value Date   MICROALBUR 2.7 (H) 10/20/2016

## 2016-10-23 NOTE — Assessment & Plan Note (Addendum)
He remains uncontrolled due to dietary and medication noncompliance,  Patient is non receptive to use of insulin , canot afford Januvia,  and often skips meals  Referral for diabetes education was advised after last visit in October but deferred by patient. He has not had labs in 10 months.  Referring to endocrinology today  And Januvia resent to pharmacy.  Should consider Jardiance given his history of CAD and PAD but deferring any future medication changes to endocrinology. Continue asa,  Statin and ACE INhibitor .  Will changeto a higher potency statin  For secondary prevention of CAD  Lab Results  Component Value Date   HGBA1C 10.0 10/20/2016   Lab Results  Component Value Date   MICROALBUR 2.7 (H) 10/20/2016

## 2016-10-23 NOTE — Telephone Encounter (Signed)
  I have reviewed his recent fasting labs and I am making the following changes:  Increase his enalapril to 5 mg daily for blood pressure control.  Change his cholesterol medication from pravastatin,  Which is weak, to atorvastatin, which is strong,  Because  His 10 yr risk of having a hart attack is almost 50% and he needs a stronger medication to decrease his risk  Both rxs have been sent.  Return in 90 days for office visit .  endocrine referral in process.  It is HIS RESPONSIBILITY to call us back if he does not get a call by the end of next week    Regards,   Duncan Dulleresa Dung Salinger, MD

## 2016-10-23 NOTE — Assessment & Plan Note (Signed)
Changing to high potency statin for 10 yr risk of  CAD approaching 50%  Lab Results  Component Value Date   CHOL 161 10/20/2016   HDL 41.70 10/20/2016   LDLCALC 35 02/03/2015   LDLDIRECT 85.0 10/20/2016   TRIG 244.0 (H) 10/20/2016   CHOLHDL 4 10/20/2016

## 2016-10-23 NOTE — Assessment & Plan Note (Addendum)
History of recurrent DVT/PE.  Has been taking Xarelto since his last event November 2013

## 2016-10-24 ENCOUNTER — Telehealth: Payer: Self-pay

## 2016-10-24 NOTE — Telephone Encounter (Signed)
Left message to return call to our office.  

## 2016-10-24 NOTE — Telephone Encounter (Signed)
-----   Message from Sherlene Shamseresa L Tullo, MD sent at 10/21/2016  3:13 PM EST ----- Labs normal except for diabetes still out of control which we discussed at his visit.  endocrine rererral is in progress.

## 2016-10-24 NOTE — Telephone Encounter (Signed)
Left message for patient to return call back.  

## 2016-10-25 NOTE — Telephone Encounter (Signed)
Patient called back gave lab results wanted to let you know he is only taking 500mg  of metformin not 1000. Wanted to know if you want him on the 1000?

## 2016-10-25 NOTE — Telephone Encounter (Signed)
Yes he should be on 1000 mg metformin.  Has he picked up the Januvia yet?

## 2016-10-25 NOTE — Telephone Encounter (Signed)
Left message to return call to our office.  

## 2016-10-26 NOTE — Telephone Encounter (Signed)
LMTCB

## 2016-10-27 MED ORDER — EMPAGLIFLOZIN 10 MG PO TABS: 10.0000 mg | ORAL_TABLET | Freq: Every day | ORAL | 5 refills | Status: DC

## 2016-10-27 NOTE — Telephone Encounter (Signed)
LMTCB

## 2016-10-27 NOTE — Telephone Encounter (Signed)
Spoke with pt and informed him of his lab results and medication changes. The pt gave a verbal understanding and stated that he would go pick up his new medications.

## 2016-10-27 NOTE — Telephone Encounter (Signed)
Pt called back returning your call. Thank you!  Call pt @ [phone]

## 2016-10-27 NOTE — Telephone Encounter (Signed)
Spoke with pt and he stated that he has not started taking the Januvia because it is to expensive. Also informed the pt that he should be 1000mg  of metformin. Pt gave a verbal understanding.

## 2016-10-27 NOTE — Addendum Note (Signed)
Addended by: Sherlene ShamsULLO, Meyer Dockery L on: 10/27/2016 04:49 PM   Modules accepted: Orders

## 2016-10-27 NOTE — Telephone Encounter (Signed)
Ok,  I have sent a different one called Jardiance,  The starting dose is 10 mg  Daily for the first month  .this one works by blocking the uptake of sugar at the kidney level ,  So his urine will have a lot of the sugar  In it ,  Which means he will  notice he is urinating more often.  It has been shown to prevent heart attacks and death fro heart attacks in patients with diabetes.    He needs to tell me if the is not going to start this medication because of cost  Or any other reason .

## 2016-11-03 NOTE — Telephone Encounter (Signed)
LMTCB

## 2016-11-05 ENCOUNTER — Other Ambulatory Visit: Payer: Self-pay | Admitting: Internal Medicine

## 2016-11-08 ENCOUNTER — Encounter: Payer: Self-pay | Admitting: Endocrinology

## 2016-11-17 NOTE — Telephone Encounter (Signed)
Have attempted to call pt several times and have been unable to reach pt.

## 2016-11-28 ENCOUNTER — Telehealth: Payer: Self-pay | Admitting: Internal Medicine

## 2016-11-28 NOTE — Telephone Encounter (Signed)
Left pt message asking to call Allison back directly at [phone] to schedule AWV. Thanks! °

## 2016-11-29 ENCOUNTER — Other Ambulatory Visit: Payer: Self-pay | Admitting: Internal Medicine

## 2016-11-30 DIAGNOSIS — E1151 Type 2 diabetes mellitus with diabetic peripheral angiopathy without gangrene: Secondary | ICD-10-CM | POA: Diagnosis not present

## 2016-11-30 DIAGNOSIS — G6289 Other specified polyneuropathies: Secondary | ICD-10-CM | POA: Diagnosis not present

## 2016-11-30 DIAGNOSIS — G894 Chronic pain syndrome: Secondary | ICD-10-CM | POA: Diagnosis not present

## 2016-11-30 DIAGNOSIS — G8929 Other chronic pain: Secondary | ICD-10-CM | POA: Diagnosis not present

## 2016-11-30 DIAGNOSIS — M79604 Pain in right leg: Secondary | ICD-10-CM | POA: Diagnosis not present

## 2016-11-30 DIAGNOSIS — G629 Polyneuropathy, unspecified: Secondary | ICD-10-CM | POA: Diagnosis not present

## 2016-11-30 DIAGNOSIS — M129 Arthropathy, unspecified: Secondary | ICD-10-CM | POA: Diagnosis not present

## 2016-11-30 DIAGNOSIS — M545 Low back pain: Secondary | ICD-10-CM | POA: Diagnosis not present

## 2016-11-30 DIAGNOSIS — M12852 Other specific arthropathies, not elsewhere classified, left hip: Secondary | ICD-10-CM | POA: Diagnosis not present

## 2016-11-30 DIAGNOSIS — M25511 Pain in right shoulder: Secondary | ICD-10-CM | POA: Diagnosis not present

## 2016-11-30 DIAGNOSIS — M961 Postlaminectomy syndrome, not elsewhere classified: Secondary | ICD-10-CM | POA: Diagnosis not present

## 2016-11-30 DIAGNOSIS — Z79891 Long term (current) use of opiate analgesic: Secondary | ICD-10-CM | POA: Diagnosis not present

## 2016-11-30 DIAGNOSIS — M1612 Unilateral primary osteoarthritis, left hip: Secondary | ICD-10-CM | POA: Diagnosis not present

## 2016-12-03 ENCOUNTER — Other Ambulatory Visit: Payer: Self-pay | Admitting: Internal Medicine

## 2016-12-21 NOTE — Telephone Encounter (Signed)
Left pt message asking to call Allison back directly at [phone] to schedule AWV. Thanks! °

## 2016-12-23 DIAGNOSIS — I129 Hypertensive chronic kidney disease with stage 1 through stage 4 chronic kidney disease, or unspecified chronic kidney disease: Secondary | ICD-10-CM | POA: Diagnosis not present

## 2016-12-23 DIAGNOSIS — I70203 Unspecified atherosclerosis of native arteries of extremities, bilateral legs: Secondary | ICD-10-CM | POA: Diagnosis not present

## 2016-12-23 DIAGNOSIS — I2510 Atherosclerotic heart disease of native coronary artery without angina pectoris: Secondary | ICD-10-CM | POA: Diagnosis not present

## 2016-12-23 DIAGNOSIS — Z87891 Personal history of nicotine dependence: Secondary | ICD-10-CM | POA: Diagnosis not present

## 2016-12-23 DIAGNOSIS — I739 Peripheral vascular disease, unspecified: Secondary | ICD-10-CM | POA: Diagnosis not present

## 2016-12-23 DIAGNOSIS — I10 Essential (primary) hypertension: Secondary | ICD-10-CM | POA: Diagnosis not present

## 2016-12-23 DIAGNOSIS — Z794 Long term (current) use of insulin: Secondary | ICD-10-CM | POA: Diagnosis not present

## 2016-12-23 DIAGNOSIS — E119 Type 2 diabetes mellitus without complications: Secondary | ICD-10-CM | POA: Diagnosis not present

## 2016-12-23 DIAGNOSIS — I6522 Occlusion and stenosis of left carotid artery: Secondary | ICD-10-CM | POA: Diagnosis not present

## 2016-12-23 DIAGNOSIS — E1151 Type 2 diabetes mellitus with diabetic peripheral angiopathy without gangrene: Secondary | ICD-10-CM | POA: Diagnosis not present

## 2017-01-27 ENCOUNTER — Ambulatory Visit: Payer: Self-pay | Admitting: Internal Medicine

## 2017-01-31 ENCOUNTER — Other Ambulatory Visit: Payer: Self-pay | Admitting: Internal Medicine

## 2017-02-03 ENCOUNTER — Other Ambulatory Visit: Payer: Self-pay | Admitting: Internal Medicine

## 2017-02-20 ENCOUNTER — Other Ambulatory Visit: Payer: Self-pay | Admitting: Internal Medicine

## 2017-02-23 ENCOUNTER — Telehealth: Payer: Self-pay | Admitting: Internal Medicine

## 2017-02-23 NOTE — Telephone Encounter (Signed)
Left pt message asking to call Allison back directly at [phone] to schedule AWV. Thanks! °  °Note*Never had AWV before °

## 2017-03-06 DIAGNOSIS — M79604 Pain in right leg: Secondary | ICD-10-CM | POA: Diagnosis not present

## 2017-03-06 DIAGNOSIS — Z79891 Long term (current) use of opiate analgesic: Secondary | ICD-10-CM | POA: Diagnosis not present

## 2017-03-06 DIAGNOSIS — M25511 Pain in right shoulder: Secondary | ICD-10-CM | POA: Diagnosis not present

## 2017-03-06 DIAGNOSIS — G6289 Other specified polyneuropathies: Secondary | ICD-10-CM | POA: Diagnosis not present

## 2017-03-06 DIAGNOSIS — M545 Low back pain: Secondary | ICD-10-CM | POA: Diagnosis not present

## 2017-03-06 DIAGNOSIS — G894 Chronic pain syndrome: Secondary | ICD-10-CM | POA: Diagnosis not present

## 2017-03-06 DIAGNOSIS — M1612 Unilateral primary osteoarthritis, left hip: Secondary | ICD-10-CM | POA: Diagnosis not present

## 2017-03-06 DIAGNOSIS — G8929 Other chronic pain: Secondary | ICD-10-CM | POA: Diagnosis not present

## 2017-03-06 DIAGNOSIS — M961 Postlaminectomy syndrome, not elsewhere classified: Secondary | ICD-10-CM | POA: Diagnosis not present

## 2017-04-04 NOTE — Telephone Encounter (Signed)
Left pt message asking to call Revonda Standard back directly at [phone] to schedule AWV. Thanks!  Note*Never had AWV before

## 2017-06-02 ENCOUNTER — Other Ambulatory Visit: Payer: Self-pay | Admitting: Internal Medicine

## 2017-06-09 ENCOUNTER — Other Ambulatory Visit: Payer: Self-pay | Admitting: Internal Medicine

## 2017-06-09 MED ORDER — ENALAPRIL MALEATE 5 MG PO TABS: 5.0000 mg | ORAL_TABLET | Freq: Every day | ORAL | 0 refills | Status: DC

## 2017-06-09 MED ORDER — RIVAROXABAN 20 MG PO TABS: 20.0000 mg | ORAL_TABLET | Freq: Every day | ORAL | 1 refills | Status: DC

## 2017-06-09 NOTE — Telephone Encounter (Signed)
Pt called requesting a refill on his XARELTO 20 MG TABS tablet, and enalapril (VASOTEC) 5 MG tablet. Pt states that he has been out for a week and a half, he has been taking care of his wife. Please advise, thank you!  Pharmacy - [facility] [address] - [place], [place] - [id number]

## 2017-06-09 NOTE — Telephone Encounter (Signed)
Medication has been refilled.

## 2017-06-15 DIAGNOSIS — G894 Chronic pain syndrome: Secondary | ICD-10-CM | POA: Diagnosis not present

## 2017-06-15 DIAGNOSIS — G8929 Other chronic pain: Secondary | ICD-10-CM | POA: Diagnosis not present

## 2017-06-15 DIAGNOSIS — M79604 Pain in right leg: Secondary | ICD-10-CM | POA: Diagnosis not present

## 2017-06-15 DIAGNOSIS — G6289 Other specified polyneuropathies: Secondary | ICD-10-CM | POA: Diagnosis not present

## 2017-06-15 DIAGNOSIS — M961 Postlaminectomy syndrome, not elsewhere classified: Secondary | ICD-10-CM | POA: Diagnosis not present

## 2017-06-15 DIAGNOSIS — Z79891 Long term (current) use of opiate analgesic: Secondary | ICD-10-CM | POA: Diagnosis not present

## 2017-06-15 DIAGNOSIS — M545 Low back pain: Secondary | ICD-10-CM | POA: Diagnosis not present

## 2017-06-15 DIAGNOSIS — M1612 Unilateral primary osteoarthritis, left hip: Secondary | ICD-10-CM | POA: Diagnosis not present

## 2017-06-15 DIAGNOSIS — M25511 Pain in right shoulder: Secondary | ICD-10-CM | POA: Diagnosis not present

## 2017-09-11 DIAGNOSIS — M1612 Unilateral primary osteoarthritis, left hip: Secondary | ICD-10-CM | POA: Diagnosis not present

## 2017-09-11 DIAGNOSIS — M79605 Pain in left leg: Secondary | ICD-10-CM | POA: Diagnosis not present

## 2017-09-11 DIAGNOSIS — M545 Low back pain: Secondary | ICD-10-CM | POA: Diagnosis not present

## 2017-09-11 DIAGNOSIS — M79604 Pain in right leg: Secondary | ICD-10-CM | POA: Diagnosis not present

## 2017-09-11 DIAGNOSIS — G894 Chronic pain syndrome: Secondary | ICD-10-CM | POA: Diagnosis not present

## 2017-09-11 DIAGNOSIS — M961 Postlaminectomy syndrome, not elsewhere classified: Secondary | ICD-10-CM | POA: Diagnosis not present

## 2017-09-11 DIAGNOSIS — M25511 Pain in right shoulder: Secondary | ICD-10-CM | POA: Diagnosis not present

## 2017-09-11 DIAGNOSIS — Z79891 Long term (current) use of opiate analgesic: Secondary | ICD-10-CM | POA: Diagnosis not present

## 2017-09-11 DIAGNOSIS — G8929 Other chronic pain: Secondary | ICD-10-CM | POA: Diagnosis not present

## 2017-12-18 DIAGNOSIS — G894 Chronic pain syndrome: Secondary | ICD-10-CM | POA: Diagnosis not present

## 2017-12-18 DIAGNOSIS — G8929 Other chronic pain: Secondary | ICD-10-CM | POA: Diagnosis not present

## 2017-12-18 DIAGNOSIS — M25511 Pain in right shoulder: Secondary | ICD-10-CM | POA: Diagnosis not present

## 2017-12-18 DIAGNOSIS — M961 Postlaminectomy syndrome, not elsewhere classified: Secondary | ICD-10-CM | POA: Diagnosis not present

## 2017-12-18 DIAGNOSIS — M545 Low back pain: Secondary | ICD-10-CM | POA: Diagnosis not present

## 2017-12-18 DIAGNOSIS — G6289 Other specified polyneuropathies: Secondary | ICD-10-CM | POA: Diagnosis not present

## 2017-12-18 DIAGNOSIS — Z79891 Long term (current) use of opiate analgesic: Secondary | ICD-10-CM | POA: Diagnosis not present

## 2018-03-05 DIAGNOSIS — E785 Hyperlipidemia, unspecified: Secondary | ICD-10-CM | POA: Diagnosis not present

## 2018-03-05 DIAGNOSIS — E1142 Type 2 diabetes mellitus with diabetic polyneuropathy: Secondary | ICD-10-CM | POA: Diagnosis not present

## 2018-03-05 DIAGNOSIS — G8929 Other chronic pain: Secondary | ICD-10-CM | POA: Diagnosis not present

## 2018-03-05 DIAGNOSIS — M79662 Pain in left lower leg: Secondary | ICD-10-CM | POA: Diagnosis not present

## 2018-03-05 DIAGNOSIS — I10 Essential (primary) hypertension: Secondary | ICD-10-CM | POA: Diagnosis not present

## 2018-03-05 DIAGNOSIS — E1151 Type 2 diabetes mellitus with diabetic peripheral angiopathy without gangrene: Secondary | ICD-10-CM | POA: Diagnosis not present

## 2018-03-05 DIAGNOSIS — K219 Gastro-esophageal reflux disease without esophagitis: Secondary | ICD-10-CM | POA: Diagnosis not present

## 2018-03-05 DIAGNOSIS — Z86718 Personal history of other venous thrombosis and embolism: Secondary | ICD-10-CM | POA: Diagnosis not present

## 2018-03-05 DIAGNOSIS — Z951 Presence of aortocoronary bypass graft: Secondary | ICD-10-CM | POA: Diagnosis not present

## 2018-03-05 DIAGNOSIS — I739 Peripheral vascular disease, unspecified: Secondary | ICD-10-CM | POA: Diagnosis not present

## 2018-03-05 DIAGNOSIS — M79605 Pain in left leg: Secondary | ICD-10-CM | POA: Diagnosis not present

## 2018-03-15 ENCOUNTER — Other Ambulatory Visit: Payer: Self-pay | Admitting: Internal Medicine

## 2018-03-16 NOTE — Telephone Encounter (Signed)
LMTCB. Please transfer pt to our office.  

## 2018-03-19 NOTE — Telephone Encounter (Signed)
LMTCB. Need to schedule pt a follow up appt. PEC Caseres speak with pt.

## 2018-03-26 DIAGNOSIS — E119 Type 2 diabetes mellitus without complications: Secondary | ICD-10-CM | POA: Diagnosis not present

## 2018-03-26 DIAGNOSIS — G8929 Other chronic pain: Secondary | ICD-10-CM | POA: Diagnosis not present

## 2018-03-26 DIAGNOSIS — E785 Hyperlipidemia, unspecified: Secondary | ICD-10-CM | POA: Diagnosis not present

## 2018-03-26 DIAGNOSIS — E1151 Type 2 diabetes mellitus with diabetic peripheral angiopathy without gangrene: Secondary | ICD-10-CM | POA: Diagnosis not present

## 2018-03-26 DIAGNOSIS — Z1159 Encounter for screening for other viral diseases: Secondary | ICD-10-CM | POA: Diagnosis not present

## 2018-03-26 DIAGNOSIS — Z23 Encounter for immunization: Secondary | ICD-10-CM | POA: Diagnosis not present

## 2018-03-26 DIAGNOSIS — M25562 Pain in left knee: Secondary | ICD-10-CM | POA: Diagnosis not present

## 2018-03-26 DIAGNOSIS — I739 Peripheral vascular disease, unspecified: Secondary | ICD-10-CM | POA: Diagnosis not present

## 2018-03-26 DIAGNOSIS — I10 Essential (primary) hypertension: Secondary | ICD-10-CM | POA: Diagnosis not present

## 2018-03-26 DIAGNOSIS — E1169 Type 2 diabetes mellitus with other specified complication: Secondary | ICD-10-CM | POA: Diagnosis not present

## 2018-04-29 ENCOUNTER — Other Ambulatory Visit: Payer: Self-pay | Admitting: Internal Medicine

## 2018-04-30 DIAGNOSIS — M79605 Pain in left leg: Secondary | ICD-10-CM | POA: Diagnosis not present

## 2018-04-30 DIAGNOSIS — M545 Low back pain: Secondary | ICD-10-CM | POA: Diagnosis not present

## 2018-04-30 DIAGNOSIS — M79604 Pain in right leg: Secondary | ICD-10-CM | POA: Diagnosis not present

## 2018-04-30 DIAGNOSIS — G8929 Other chronic pain: Secondary | ICD-10-CM | POA: Diagnosis not present

## 2018-04-30 DIAGNOSIS — G6289 Other specified polyneuropathies: Secondary | ICD-10-CM | POA: Diagnosis not present

## 2018-04-30 DIAGNOSIS — Z79891 Long term (current) use of opiate analgesic: Secondary | ICD-10-CM | POA: Diagnosis not present

## 2018-04-30 DIAGNOSIS — I739 Peripheral vascular disease, unspecified: Secondary | ICD-10-CM | POA: Diagnosis not present

## 2018-04-30 DIAGNOSIS — M1612 Unilateral primary osteoarthritis, left hip: Secondary | ICD-10-CM | POA: Diagnosis not present

## 2018-04-30 DIAGNOSIS — M25511 Pain in right shoulder: Secondary | ICD-10-CM | POA: Diagnosis not present

## 2018-04-30 DIAGNOSIS — M961 Postlaminectomy syndrome, not elsewhere classified: Secondary | ICD-10-CM | POA: Diagnosis not present

## 2018-04-30 DIAGNOSIS — G894 Chronic pain syndrome: Secondary | ICD-10-CM | POA: Diagnosis not present

## 2018-04-30 DIAGNOSIS — E1169 Type 2 diabetes mellitus with other specified complication: Secondary | ICD-10-CM | POA: Diagnosis not present

## 2018-04-30 NOTE — Telephone Encounter (Signed)
LMTCB. Pt has not been seen since March 2018. Pt needs to schedule an office visit with Dr. Darrick Huntsmanullo. PEC Hinkley speak with pt.

## 2018-05-03 NOTE — Telephone Encounter (Signed)
Last OV 10/20/2016   Last refilled 01/31/2017 disp 90 with no refills   Pt needs a ppt for more refills

## 2018-05-07 ENCOUNTER — Other Ambulatory Visit: Payer: Self-pay | Admitting: Internal Medicine

## 2018-06-25 DIAGNOSIS — M545 Low back pain: Secondary | ICD-10-CM | POA: Diagnosis not present

## 2018-06-25 DIAGNOSIS — G894 Chronic pain syndrome: Secondary | ICD-10-CM | POA: Diagnosis not present

## 2018-06-25 DIAGNOSIS — G8929 Other chronic pain: Secondary | ICD-10-CM | POA: Diagnosis not present

## 2018-06-25 DIAGNOSIS — M25511 Pain in right shoulder: Secondary | ICD-10-CM | POA: Diagnosis not present

## 2018-06-25 DIAGNOSIS — M961 Postlaminectomy syndrome, not elsewhere classified: Secondary | ICD-10-CM | POA: Diagnosis not present

## 2018-06-25 DIAGNOSIS — I739 Peripheral vascular disease, unspecified: Secondary | ICD-10-CM | POA: Diagnosis not present

## 2018-06-25 DIAGNOSIS — M79604 Pain in right leg: Secondary | ICD-10-CM | POA: Diagnosis not present

## 2018-06-25 DIAGNOSIS — Z79891 Long term (current) use of opiate analgesic: Secondary | ICD-10-CM | POA: Diagnosis not present

## 2018-06-25 DIAGNOSIS — G6289 Other specified polyneuropathies: Secondary | ICD-10-CM | POA: Diagnosis not present

## 2018-06-25 DIAGNOSIS — M79605 Pain in left leg: Secondary | ICD-10-CM | POA: Diagnosis not present

## 2018-06-25 DIAGNOSIS — M1612 Unilateral primary osteoarthritis, left hip: Secondary | ICD-10-CM | POA: Diagnosis not present

## 2018-07-09 DIAGNOSIS — Z1211 Encounter for screening for malignant neoplasm of colon: Secondary | ICD-10-CM | POA: Diagnosis not present

## 2018-07-09 DIAGNOSIS — F17211 Nicotine dependence, cigarettes, in remission: Secondary | ICD-10-CM | POA: Diagnosis not present

## 2018-07-09 DIAGNOSIS — E119 Type 2 diabetes mellitus without complications: Secondary | ICD-10-CM | POA: Diagnosis not present

## 2018-07-09 DIAGNOSIS — Z23 Encounter for immunization: Secondary | ICD-10-CM | POA: Diagnosis not present

## 2018-07-25 DIAGNOSIS — Z1211 Encounter for screening for malignant neoplasm of colon: Secondary | ICD-10-CM | POA: Diagnosis not present

## 2019-04-02 ENCOUNTER — Other Ambulatory Visit: Payer: Self-pay

## 2019-04-02 DIAGNOSIS — Z20822 Contact with and (suspected) exposure to covid-19: Secondary | ICD-10-CM

## 2019-04-03 LAB — NOVEL CORONAVIRUS, NAA: SARS-CoV-2, NAA: NOT DETECTED

## 2021-07-06 ENCOUNTER — Encounter: Payer: Self-pay | Admitting: Occupational Therapy

## 2021-07-06 ENCOUNTER — Other Ambulatory Visit: Payer: Self-pay

## 2021-07-06 ENCOUNTER — Ambulatory Visit: Payer: Medicare Other | Attending: Internal Medicine | Admitting: Occupational Therapy

## 2021-07-06 DIAGNOSIS — R278 Other lack of coordination: Secondary | ICD-10-CM | POA: Insufficient documentation

## 2021-07-06 DIAGNOSIS — M6281 Muscle weakness (generalized): Secondary | ICD-10-CM | POA: Insufficient documentation

## 2021-07-06 NOTE — Therapy (Addendum)
[facility] MAIN [facility] SERVICES [address] [place], [place], [address] Phone: [phone]   Fax:  [phone]  Occupational Therapy Evaluation  Patient Details  Name: Johnathan Hernandez MRN: [id number] Date of Birth: 1950-09-23 Referring Provider (OT): Mayme Genta   Encounter Date: 07/06/2021   OT End of Session - 07/06/21 1657     Visit Number 1    Number of Visits 24    Date for OT Re-Evaluation 09/28/21    Authorization Time Period Progress reporting period starting 07/06/2021    OT Start Time 1345    OT Stop Time 1450    OT Time Calculation (min) 65 min    Activity Tolerance Patient tolerated treatment well    Behavior During Therapy [facility] for tasks assessed/performed             Past Medical History:  Diagnosis Date   Coronary artery disease    Diabetes mellitus    History of tobacco abuse    quit in 2006 after 5 vessel cabg   Hyperlipidemia    Hypertension    Peripheral vascular disease [facility]) Dec 2012   [facility], treated with extensive arterial bypass    Past Surgical History:  Procedure Laterality Date   AORTA - ILIAC ARTERY BYPASS GRAFT  dec 2012   [facility]   CORONARY ARTERY BYPASS GRAFT  2006   [facility]    There were no vitals filed for this visit.   Subjective Assessment - 07/06/21 1642     Subjective  Pt. reports that his arm, and leg feels like rubber.    Patient is accompanied by: Family member    Pertinent History Pt. is a 70 y.o. male who was diagnosed with a CVA with right sided weakness. Pt. was admitted to [facility], and received inpatient rehab services. Pt.'s PMHx includes: DM Type II, Diabetic peripheral neuropathy, CAD, HTN, Hyperlipidemia, VTE. Pt. enjoys working out in his yard, and fishing.    Currently in Pain? Yes    Pain Score 6     Pain Location Shoulder    Pain Orientation Right    Pain Descriptors / Indicators Tightness    Pain Type Chronic pain    Multiple Pain Sites Yes    Pain Score 5    Pain  Location Thoracic    Pain Orientation Right    Pain Descriptors / Indicators Aching    Pain Type Chronic pain               [facility] OT Assessment - 07/06/21 1358       Assessment   Medical Diagnosis CVA    Referring Provider (OT) Jearld Pies, [place]    Onset Date/Surgical Date 05/21/21    Hand Dominance Right    Next MD Visit 07/19/2021    Prior Therapy OT/PT      Precautions   Precautions None      Restrictions   Weight Bearing Restrictions No      Balance Screen   Has the patient fallen in the past 6 months Yes    How many times? 3    Has the patient had a decrease in activity level because of a fear of falling?  Yes    Is the patient reluctant to leave their home because of a fear of falling?  Yes      Home  Environment   Family/patient expects to be discharged to: Private residence    Living Arrangements Children    Available Help  at Discharge Family    Type of Home House    Home Layout One level    Bathroom Shower/Tub Walk-in Shower;Curtain    Home Equipment Grab bars - tub/shower;Hand held Careers information officer - 2 wheels;Shower seat;Bedside commode;Wheelchair - manual      Prior Function   Level of Independence Independent    Vocation Retired    Leisure [employer], fishing, goodwill shopping.      ADL   Eating/Feeding Minimal assistance   Drops utensils from the right hand, Occasionally uses the left hand to guide 2/2 spasms.   Grooming Moderate assistance    Upper Body Bathing Independent    Lower Body Bathing Independent    Upper Body Dressing Minimal assistance   Difficulty with buttoning   Lower Body Dressing Moderate assistance    Toilet Transfer Modified independent    Toileting -  Hygiene Minimal assistance    Tub/Shower Transfer Supervision/safety      IADL   Prior Level of Function Shopping Independent    Shopping Assistance for transportation    Prior Level of Function Light Housekeeping Independent    Light Housekeeping Needs help with all  home maintenance tasks    Prior Level of Function Meal Prep Independent    Meal Prep Needs to have meals prepared and served    Prior Level of Function Best boy Relies on family or friends for transportation    Prior Level of Function Medication Managment Independent    Medication Management Is responsible for taking medication in correct dosages at correct time    Prior Level of Function Chemical engineer financial matters independently (budgets, writes checks, pays rent, bills goes to bank), collects and keeps track of income      Written Expression   Dominant Hand Right    Handwriting 25% legible      Vision - History   Baseline Vision Wears glasses all the time    Patient Visual Report --   No change from baseline     Cognition   Overall Cognitive Status Within Functional Limits for tasks assessed    Cognition Comments pt. reports occasssionally having difficulty getting the correct word out.      Observation/Other Assessments   Focus on Therapeutic Outcomes (FOTO)  OT FOTO: 58, TR score: 70      Sensation   Light Touch Impaired by gross assessment   3rd digit, and tips of digits.   Proprioception Appears Intact      Coordination   Right 9 Hole Peg Test 42 sec.    Left 9 Hole Peg Test 27 sec.      AROM   Overall AROM Comments Right shoulder flexion: 111(127), abduction: 97(110), elbow extension -14(-10), flexion: 140(140), wrist extension: 56. Digit flexion to [facility]: 2nd: 2.5cm, 3rd: 2cm, 4th: 0cm, 5th: 0cm; LUE WFL      Strength   Overall Strength Comments Rightshoulder flexion, abduction 3-/5, elbow flexion, extension: 3+/5, wrist extension 3+/5 LUE: WFL      Hand Function   Right Hand Grip (lbs) 60    Right Hand Lateral Pinch 19 lbs    Right Hand 3 Point Pinch 13 lbs    Left Hand Grip (lbs) 57    Left Hand Lateral Pinch 20 lbs    Left 3 point pinch 14 lbs  OT Education - 07/06/21 1654     Education Details Pt. education was provided about POC, and goals.    Person(s) Educated Patient    Methods Explanation    Comprehension Verbalized understanding              OT Short Term Goals - 07/06/21 1708       OT SHORT TERM GOAL #1   Title Pt. will demonstrate independence with a HEP for the RUE.    Baseline Eval: No current HEP.    Time 6    Period Weeks    Status New    Target Date 08/17/21               OT Long Term Goals - 07/06/21 1709       OT LONG TERM GOAL #1   Title Pt. will increase his FOTO score by 2 pts. for clinically significant ADL related change.    Baseline EVal: FOTO score: 58, TR: 70    Time 12    Period Weeks    Status New    Target Date 09/28/21      OT LONG TERM GOAL #2   Title Pt. will improve right shoulder flexion by 10 degrees to be able to reach up to access cabinetry, and shelves.    Baseline Eval: Shoulder flexion: 111(127)    Time 12    Period Weeks    Status New    Target Date 09/28/21      OT LONG TERM GOAL #3   Title Pt. will improve right hand [facility] skills by 5 sec. of speed to assist with buttoning.    Baseline Eval: R: 42 sec., L: 27 sec.    Time 12    Period Weeks    Status New    Target Date 09/28/21      OT LONG TERM GOAL #4   Title Pt. will perform light meal prep with modified independence using using work simplification strategies as needed    Baseline Eval: Pt. is unable    Time 12    Period Weeks    Status New    Target Date 09/28/21      OT LONG TERM GOAL #5   Title Pt. will independently be able to perfrom self-feeding using his right hand    Baseline Eval: Pt. has difficulty performing self-feeding with his right hand    Time 12    Period Weeks    Status New    Target Date 09/28/21      OT LONG TERM GOAL #6   Title Pt. will perform light homemaking tasks with Modified Independence.    Baseline Eval: Pt. is not  performing home management tasks.    Time 12    Period Weeks    Status New    Target Date 09/28/21                   Plan - 07/06/21 1700     Clinical Impression Statement Pt. is 70 y.o male who was diagnosed with a CVA with Right sided weakness. Pt. presents with impaired light touch sensation in the right 3rd digit, as well as the distal tips of each digit on the right hand. Pt. presents with 6/10 pain in the right shoulder and lateral torso, decreased right shoulder ROM, strength, pinch strength, and FMC, speed, and dexterity which limits his ability to complete ADLs, and IADL tasks. Pt. will benefit from OT services to work towards improving  overall RUE functioning in order to be able to engage his RUE in self-feeding, grooming, and household tasks including cooking, cleaning, and dishwashing.    OT Occupational Profile and History Detailed Assessment- Review of Records and additional review of physical, cognitive, psychosocial history related to current functional performance    Occupational performance deficits (Please refer to evaluation for details): IADL's;ADL's;Leisure    Rehab Potential Good    Clinical Decision Making Several treatment options, min-mod task modification necessary    Comorbidities Affecting Occupational Performance: [place] have comorbidities impacting occupational performance    Modification or Assistance to Complete Evaluation  Min-Moderate modification of tasks or assist with assess necessary to complete eval    OT Frequency 2x / week    OT Duration 12 weeks    OT Treatment/Interventions Self-care/ADL training;Therapeutic exercise;Neuromuscular education;Electrical Stimulation;Moist Heat;Passive range of motion;Therapeutic activities;Patient/family education;DME and/or AE instruction;Functional Mobility Training;Energy conservation;Manual Therapy    Consulted and Agree with Plan of Care Patient             Patient will benefit from skilled therapeutic  intervention in order to improve the following deficits and impairments:           Visit Diagnosis: Muscle weakness (generalized)  Other lack of coordination    Problem List Patient Active Problem List   Diagnosis Date Noted   Noncompliance with medication treatment due to intermittent use of medication 11/24/2015   Gastritis 11/24/2015   Allergic rhinitis 09/03/2015   GERD (gastroesophageal reflux disease) 09/03/2015   Other vitamin B12 deficiency anemia 09/10/2012   Diabetic polyneuropathy (HCC) 09/10/2012   Radiculopathy of lumbar region 07/24/2012   History of pulmonary embolism 07/24/2012   Hypertension associated with diabetes (HCC) 04/30/2012   Vasomotor flushing 04/30/2012   Hyperlipidemia with target LDL less than 70 11/16/2011   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 [place], [place], [address] Phone: [phone]   Fax:  [phone]  Name: Johnathan Hernandez MRN: [id number] Date of Birth: Jun 07, 1951

## 2021-07-12 ENCOUNTER — Ambulatory Visit: Payer: Medicare Other | Admitting: Occupational Therapy

## 2021-07-12 ENCOUNTER — Other Ambulatory Visit: Payer: Self-pay

## 2021-07-12 DIAGNOSIS — R278 Other lack of coordination: Secondary | ICD-10-CM

## 2021-07-12 DIAGNOSIS — M6281 Muscle weakness (generalized): Secondary | ICD-10-CM

## 2021-07-13 ENCOUNTER — Encounter: Payer: Self-pay | Admitting: Occupational Therapy

## 2021-07-13 NOTE — Therapy (Signed)
[place] [facility] MAIN [facility] SERVICES [address] [place], [place], [address] Phone: [phone]   Fax:  [phone]  Occupational Therapy Treatment  Patient Details  Name: Johnathan Hernandez MRN: [id number] Date of Birth: Jun 09, 1951 Referring Provider (OT): Mayme Genta   Encounter Date: 07/12/2021   OT End of Session - 07/13/21 0818     Visit Number 2    Number of Visits 24    Date for OT Re-Evaluation 09/28/21    Authorization Time Period Progress reporting period starting 07/06/2021    OT Start Time 1300    OT Stop Time 1345    OT Time Calculation (min) 45 min    Activity Tolerance Patient tolerated treatment well    Behavior During Therapy Johnathan Hernandez for tasks assessed/performed             Past Medical History:  Diagnosis Date   Coronary artery disease    Diabetes mellitus    History of tobacco abuse    quit in 2006 after 5 vessel cabg   Hyperlipidemia    Hypertension    Peripheral vascular disease [facility]) Dec 2012   [facility], treated with extensive arterial bypass    Past Surgical History:  Procedure Laterality Date   AORTA - ILIAC ARTERY BYPASS GRAFT  dec 2012   [facility]   CORONARY ARTERY BYPASS GRAFT  2006       There were no vitals filed for this visit.   Subjective Assessment - 07/13/21 0817     Subjective  Pt. reports not feeling well this morning.    Patient is accompanied by: Family member    Pertinent History Pt. is a 70 y.o. male who was diagnosed with a CVA with right sided weakness. Pt. was admitted to [facility], and received inpatient rehab services. Pt.'s PMHx includes: DM Type II, Diabetic peripheral neuropathy, CAD, HTN, Hyperlipidemia, VTE. Pt. enjoys working out in his yard, and fishing.    Currently in Pain? Yes    Pain Score 6   Pain Location Arm   And right thoracic region   Pain Orientation Right    Pain Descriptors / Indicators Aching    Pain Type Chronic pain    Pain Onset More than a month ago             Therapeutic Ex.  Pt. Performed AROM followed by PROM for right shoulder flexion, abduction, horizontal adduction, and abduction, forearm supination following moist heat.. Pt. performed  right gross gripping with a gross grip strengthener. Pt. worked on sustaining grip while grasping pegs and reaching at various heights. The Gripper was set to  23.4 # of grip strength resistance.   Neuromuscular re-education:  Pt. performed FMC tasks using the grooved pegboard. Pt. worked on grasping the grooved pegs from a horizontal position, and moving the pegs to a vertical position in the hand to prepare for placing them in the grooved slot.  Pt. worked on twisting the grooved pegs between his fingers to turn them into the vertical position on the pegboard.    Pt. reports 6/10 right arm, and right sided thoracic pain. Pt. presents with limited RUE ROM, strength, motor control, and [facility] skills. Pt. Tolerated ROM, with decreased pain, and increased range as ROM progressed. Pt. requires cues for hand position, and movement pattern when turning, twisting the grooved pegs to fit vertically in the pegboard. Pt. continues to work towards improving right shoulder ROM, strength, motor control, and [facility] skills in order to improve  RUE functioning in preparation for ADLs, and IADL tasks.                    OT Education - 07/13/21 0818     Education Details Pt. education was provided about POC, and goals.    Person(s) Educated Patient    Methods Explanation    Comprehension Verbalized understanding              OT Short Term Goals - 07/06/21 1708       OT SHORT TERM GOAL #1   Title Pt. will demonstrate independence with a HEP for the RUE.    Baseline Eval: No current HEP.    Time 6    Period Weeks    Status New    Target Date 08/17/21               OT Long Term Goals - 07/06/21 1709       OT LONG TERM GOAL #1   Title Pt. will increase his FOTO score by 2 pts. for clinically  significant ADL related change.    Baseline EVal: FOTO score: 58, TR: 70    Time 12    Period Weeks    Status New    Target Date 09/28/21      OT LONG TERM GOAL #2   Title Pt. will improve right shoulder flexion by 10 degrees to be able to reach up to access cabinetry, and shelves.    Baseline Eval: Shoulder flexion: 111(127)    Time 12    Period Weeks    Status New    Target Date 09/28/21      OT LONG TERM GOAL #3   Title Pt. will improve right hand [facility] skills by 5 sec. of speed to assist with buttoning.    Baseline Eval: R: 42 sec., L: 27 sec.    Time 12    Period Weeks    Status New    Target Date 09/28/21      OT LONG TERM GOAL #4   Title Pt. will perform light meal prep with modified independence using using work simplification strategies as needed    Baseline Eval: Pt. is unable    Time 12    Period Weeks    Status New    Target Date 09/28/21      OT LONG TERM GOAL #5   Title Pt. will independently be able to perfrom self-feeding using his right hand    Baseline Eval: Pt. has difficulty performing self-feeding with his right hand    Time 12    Period Weeks    Status New    Target Date 09/28/21      OT LONG TERM GOAL #6   Title Pt. will perform light homemaking tasks with Modified Independence.    Baseline Eval: Pt. is not performing home management tasks.    Time 12    Period Weeks    Status New    Target Date 09/28/21                   Plan - 07/13/21 0819     Clinical Impression Statement Pt. reports 6/10 right arm, and right sided thoracic pain. Pt. presents with limited RUE ROM, strength, motor control, and [facility] skills. Pt. Tolerated ROM, with decreased pain, and increased range as ROM progressed. Pt. requires cues for hand position, and movement pattern when turning, twisting the grooved pegs to fit vertically in the pegboard. Pt.  continues to work towards improving right shoulder ROM, strength, motor control, and [place] [facility] skills in order to improve  RUE functioning in preparation for ADLs, and IADL tasks.     OT Occupational Profile and History Detailed Assessment- Review of Records and additional review of physical, cognitive, psychosocial history related to current functional performance    Occupational performance deficits (Please refer to evaluation for details): IADL's;ADL's;Leisure    Rehab Potential Good    Clinical Decision Making Several treatment options, min-mod task modification necessary    Comorbidities Affecting Occupational Performance: [place] have comorbidities impacting occupational performance    Modification or Assistance to Complete Evaluation  Min-Moderate modification of tasks or assist with assess necessary to complete eval    OT Frequency 2x / week    OT Duration 12 weeks    OT Treatment/Interventions Self-care/ADL training;Therapeutic exercise;Neuromuscular education;Electrical Stimulation;Moist Heat;Passive range of motion;Therapeutic activities;Patient/family education;DME and/or AE instruction;Functional Mobility Training;Energy conservation;Manual Therapy    Consulted and Agree with Plan of Care Patient             Patient will benefit from skilled therapeutic intervention in order to improve the following deficits and impairments:           Visit Diagnosis: Muscle weakness (generalized)  Other lack of coordination    Problem List Patient Active Problem List   Diagnosis Date Noted   Noncompliance with medication treatment due to intermittent use of medication 11/24/2015   Gastritis 11/24/2015   Allergic rhinitis 09/03/2015   GERD (gastroesophageal reflux disease) 09/03/2015   Other vitamin B12 deficiency anemia 09/10/2012   Diabetic polyneuropathy (HCC) 09/10/2012   Radiculopathy of lumbar region 07/24/2012   History of pulmonary embolism 07/24/2012   Hypertension associated with diabetes (HCC) 04/30/2012   Vasomotor flushing 04/30/2012   Hyperlipidemia with target LDL less than 70  11/16/2011   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

## 2021-07-14 ENCOUNTER — Ambulatory Visit: Payer: Medicare Other | Admitting: Occupational Therapy

## 2021-07-19 ENCOUNTER — Other Ambulatory Visit: Payer: Self-pay

## 2021-07-19 ENCOUNTER — Ambulatory Visit: Payer: Medicare Other | Attending: Student | Admitting: Occupational Therapy

## 2021-07-19 ENCOUNTER — Encounter: Payer: Self-pay | Admitting: Occupational Therapy

## 2021-07-19 DIAGNOSIS — R278 Other lack of coordination: Secondary | ICD-10-CM | POA: Insufficient documentation

## 2021-07-19 DIAGNOSIS — M6281 Muscle weakness (generalized): Secondary | ICD-10-CM | POA: Diagnosis not present

## 2021-07-19 NOTE — Therapy (Signed)
[facility] MAIN [facility] SERVICES [address] [place], [place], [address] Phone: [phone]   Fax:  [phone]  Occupational Therapy Treatment  Patient Details  Name: Johnathan Hernandez MRN: [id number] Date of Birth: 10-Apr-1951 Referring Provider (OT): Mayme Genta   Encounter Date: 07/19/2021   OT End of Session - 07/19/21 1359     Visit Number 3    Number of Visits 24    Date for OT Re-Evaluation 09/28/21    Authorization Time Period Progress reporting period starting 07/06/2021    OT Start Time 1300    OT Stop Time 1345    OT Time Calculation (min) 45 min    Activity Tolerance Patient tolerated treatment well    Behavior During Therapy [facility] for tasks assessed/performed             Past Medical History:  Diagnosis Date   Coronary artery disease    Diabetes mellitus    History of tobacco abuse    quit in 2006 after 5 vessel cabg   Hyperlipidemia    Hypertension    Peripheral vascular disease [facility]) Dec 2012   [facility], treated with extensive arterial bypass    Past Surgical History:  Procedure Laterality Date   AORTA - ILIAC ARTERY BYPASS GRAFT  dec 2012   [facility]   CORONARY ARTERY BYPASS GRAFT  2006   [place]    There were no vitals filed for this visit.   Subjective Assessment - 07/19/21 1356     Subjective  Pt. reports having a history of back pain which he is being followed for at the pain clinic.    Patient is accompanied by: Family member    Pertinent History Pt. is a 70 y.o. male who was diagnosed with a CVA with right sided weakness. Pt. was admitted to [facility], and received inpatient rehab services. Pt.'s PMHx includes: DM Type II, Diabetic peripheral neuropathy, CAD, HTN, Hyperlipidemia, VTE. Pt. enjoys working out in his yard, and fishing.    Currently in Pain? Yes    Pain Score 7     Pain Location Arm   rightsided lateral thoracic pain   Pain Orientation Right    Pain Descriptors / Indicators Burning    Pain Type  Chronic pain              Therapeutic Ex.   Pt. performed AROM followed by PROM for right shoulder flexion, abduction, horizontal adduction, and abduction, forearm supination following moist heat.  Pt. Performed AAROM at the tabletop for shoulder flexion, scaption, and abduction. Pt. worked on pinch strengthening in the left hand for lateral, and 3pt. pinch using yellow, red, green, and blue resistive clips. Pt. worked on placing the clips at various vertical and horizontal angles. Tactile and verbal cues were required for eliciting the desired movement.    Manual Therapy:  Pt. Tolerated gentle scapular glides in elevation, depression, abduct./rotation. Pt. tolerated gentle soft tissue massage to the musculature of the scapular region secondary increased tone, and tightness. Manual therapy was performed independent of, and in preparation for ROM.   Pt. Reports a history of upper, and lower back pain that he he has been followed at the pain clinic for several years. Pt. Reports since the stroke that his pain has also shifted to his right lateral thoracic region. Pt. reports 7/10 right arm, and right sided thoracic pain initially upon arrival. Pt. Reported reduced pain following the treatment session. Pt. presents with limited RUE ROM, strength,  motor control, and [facility] skills. Pt. tolerated ROM, with decreased pain, and increased range as ROM progressed.  Pt. Was able to demonstrate proper technique of tabletop there. Ex.  Pt. continues to work towards improving right shoulder ROM, strength, motor control, and [facility] skills in order to improve RUE functioning in preparation for ADLs, and IADL tasks.                           OT Education - 07/19/21 1359     Education Details Pt. education was provided about POC, and goals.    Person(s) Educated Patient    Methods Explanation    Comprehension Verbalized understanding              OT Short Term Goals - 07/06/21 1708        OT SHORT TERM GOAL #1   Title Pt. will demonstrate independence with a HEP for the RUE.    Baseline Eval: No current HEP.    Time 6    Period Weeks    Status New    Target Date 08/17/21               OT Long Term Goals - 07/06/21 1709       OT LONG TERM GOAL #1   Title Pt. will increase his FOTO score by 2 pts. for clinically significant ADL related change.    Baseline EVal: FOTO score: 58, TR: 70    Time 12    Period Weeks    Status New    Target Date 09/28/21      OT LONG TERM GOAL #2   Title Pt. will improve right shoulder flexion by 10 degrees to be able to reach up to access cabinetry, and shelves.    Baseline Eval: Shoulder flexion: 111(127)    Time 12    Period Weeks    Status New    Target Date 09/28/21      OT LONG TERM GOAL #3   Title Pt. will improve right hand [facility] skills by 5 sec. of speed to assist with buttoning.    Baseline Eval: R: 42 sec., L: 27 sec.    Time 12    Period Weeks    Status New    Target Date 09/28/21      OT LONG TERM GOAL #4   Title Pt. will perform light meal prep with modified independence using using work simplification strategies as needed    Baseline Eval: Pt. is unable    Time 12    Period Weeks    Status New    Target Date 09/28/21      OT LONG TERM GOAL #5   Title Pt. will independently be able to perfrom self-feeding using his right hand    Baseline Eval: Pt. has difficulty performing self-feeding with his right hand    Time 12    Period Weeks    Status New    Target Date 09/28/21      OT LONG TERM GOAL #6   Title Pt. will perform light homemaking tasks with Modified Independence.    Baseline Eval: Pt. is not performing home management tasks.    Time 12    Period Weeks    Status New    Target Date 09/28/21                   Plan - 07/19/21 1359     Clinical Impression Statement  Pt. Reports a history of upper, and lower back pain that he he has been followed at the pain clinic for several years.  Pt. Reports since the stroke that his pain has also shifted to his right lateral thoracic region. Pt. reports 7/10 right arm, and right sided thoracic pain initially upon arrival. Pt. Reported reduced pain following the treatment session. Pt. presents with limited RUE ROM, strength, motor control, and [facility] skills. Pt. tolerated ROM, with decreased pain, and increased range as ROM progressed.  Pt. Was able to demonstrate proper technique of tabletop there. Ex.  Pt. continues to work towards improving right shoulder ROM, strength, motor control, and [facility] skills in order to improve RUE functioning in preparation for ADLs, and IADL tasks.                 OT Occupational Profile and History Detailed Assessment- Review of Records and additional review of physical, cognitive, psychosocial history related to current functional performance    Occupational performance deficits (Please refer to evaluation for details): IADL's;ADL's;Leisure    Rehab Potential Good    Clinical Decision Making Several treatment options, min-mod task modification necessary    Comorbidities Affecting Occupational Performance: [place] have comorbidities impacting occupational performance    Modification or Assistance to Complete Evaluation  Min-Moderate modification of tasks or assist with assess necessary to complete eval    OT Frequency 2x / week    OT Duration 12 weeks    OT Treatment/Interventions Self-care/ADL training;Therapeutic exercise;Neuromuscular education;Electrical Stimulation;Moist Heat;Passive range of motion;Therapeutic activities;Patient/family education;DME and/or AE instruction;Functional Mobility Training;Energy conservation;Manual Therapy    Consulted and Agree with Plan of Care Patient             Patient will benefit from skilled therapeutic intervention in order to improve the following deficits and impairments:           Visit Diagnosis: Muscle weakness (generalized)    Problem List Patient  Active Problem List   Diagnosis Date Noted   Noncompliance with medication treatment due to intermittent use of medication 11/24/2015   Gastritis 11/24/2015   Allergic rhinitis 09/03/2015   GERD (gastroesophageal reflux disease) 09/03/2015   Other vitamin B12 deficiency anemia 09/10/2012   Diabetic polyneuropathy (HCC) 09/10/2012   Radiculopathy of lumbar region 07/24/2012   History of pulmonary embolism 07/24/2012   Hypertension associated with diabetes (HCC) 04/30/2012   Vasomotor flushing 04/30/2012   Hyperlipidemia with target LDL less than 70 11/16/2011   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

## 2021-07-21 ENCOUNTER — Other Ambulatory Visit: Payer: Self-pay

## 2021-07-21 ENCOUNTER — Encounter: Payer: Self-pay | Admitting: Occupational Therapy

## 2021-07-21 ENCOUNTER — Ambulatory Visit: Payer: Medicare Other | Admitting: Occupational Therapy

## 2021-07-21 DIAGNOSIS — R278 Other lack of coordination: Secondary | ICD-10-CM

## 2021-07-21 DIAGNOSIS — M6281 Muscle weakness (generalized): Secondary | ICD-10-CM | POA: Diagnosis not present

## 2021-07-21 NOTE — Therapy (Signed)
[facility] MAIN [facility] SERVICES [address] [place], [place], [address] Phone: [phone]   Fax:  [phone]  Occupational Therapy Treatment  Patient Details  Name: Johnathan Hernandez MRN: [id number] Date of Birth: [birth year] Referring Provider (OT): Mayme Genta   Encounter Date: 07/21/2021   OT End of Session - 07/25/21 1335     Visit Number 4    Number of Visits 24    Date for OT Re-Evaluation 09/28/21    Authorization Time Period Progress reporting period starting 07/06/2021    OT Start Time 1300    OT Stop Time 1345    OT Time Calculation (min) 45 min    Activity Tolerance Patient tolerated treatment well    Behavior During Therapy [facility] for tasks assessed/performed             Past Medical History:  Diagnosis Date   Coronary artery disease    Diabetes mellitus    History of tobacco abuse    quit in 2006 after 5 vessel cabg   Hyperlipidemia    Hypertension    Peripheral vascular disease [facility]) Dec 2012   [facility], treated with extensive arterial bypass    Past Surgical History:  Procedure Laterality Date   AORTA - ILIAC ARTERY BYPASS GRAFT  dec 2012   [facility]   CORONARY ARTERY BYPASS GRAFT  2006   [place]    There were no vitals filed for this visit.   Subjective Assessment - 07/25/21 1334     Subjective  Pt reports about his Right arm and hand, "feels like something is shocking me at all times"    Pertinent History Pt. is a 70 y.o. male who was diagnosed with a CVA with right sided weakness. Pt. was admitted to [facility], and received inpatient rehab services. Pt.'s PMHx includes: DM Type II, Diabetic peripheral neuropathy, CAD, HTN, Hyperlipidemia, VTE. Pt. enjoys working out in his yard, and fishing.    Patient Stated Goals Pt would like to be able to use his hand and arm without any restrictions    Currently in Pain? Yes    Pain Score 6     Pain Location Shoulder    Pain Orientation Right    Pain Descriptors / Indicators  Burning    Pain Type Chronic pain    Pain Onset More than a month ago              Right arm and hand, "feels like something is shocking me at all times" rated as 6/10  Therapeutic Exercise:  Increased pain with ABD of right arm 1.5# dowel exercises 10 reps each for shoulder flexion, ABD, chest press, forwards and backwards circles, climbing up down with hand over hand method.  Therapist demo and cues for proper form and technique.    Neuromuscular reeducation:  Manipulation of Ball pegs with right hand, able to turn with cues Pt working towards using translatory skills of the hand and then using the hand for storage.   Pt seen for handwriting for name and address, fair legibility Progressing to List of 10 items with fair legibility  Response to tx: Pt continues to progress in all areas.  Still has some shoulder pain and describes more as a burning sensation, pain increased with ABD of the arm.  Pt working towards picking up and manipulation of small objects as well as translatory skills of the hand and using the hand for storage.  Continue to work towards goals in plan  of care to maximize safety and independence in necessary daily tasks.                      OT Education - 07/25/21 1335     Education Details ROM, hand function, reaching    Person(s) Educated Patient    Methods Explanation    Comprehension Verbalized understanding              OT Short Term Goals - 07/06/21 1708       OT SHORT TERM GOAL #1   Title Pt. will demonstrate independence with a HEP for the RUE.    Baseline Eval: No current HEP.    Time 6    Period Weeks    Status New    Target Date 08/17/21               OT Long Term Goals - 07/06/21 1709       OT LONG TERM GOAL #1   Title Pt. will increase his FOTO score by 2 pts. for clinically significant ADL related change.    Baseline EVal: FOTO score: 58, TR: 70    Time 12    Period Weeks    Status New    Target Date  09/28/21      OT LONG TERM GOAL #2   Title Pt. will improve right shoulder flexion by 10 degrees to be able to reach up to access cabinetry, and shelves.    Baseline Eval: Shoulder flexion: 111(127)    Time 12    Period Weeks    Status New    Target Date 09/28/21      OT LONG TERM GOAL #3   Title Pt. will improve right hand [facility] skills by 5 sec. of speed to assist with buttoning.    Baseline Eval: R: 42 sec., L: 27 sec.    Time 12    Period Weeks    Status New    Target Date 09/28/21      OT LONG TERM GOAL #4   Title Pt. will perform light meal prep with modified independence using using work simplification strategies as needed    Baseline Eval: Pt. is unable    Time 12    Period Weeks    Status New    Target Date 09/28/21      OT LONG TERM GOAL #5   Title Pt. will independently be able to perfrom self-feeding using his right hand    Baseline Eval: Pt. has difficulty performing self-feeding with his right hand    Time 12    Period Weeks    Status New    Target Date 09/28/21      OT LONG TERM GOAL #6   Title Pt. will perform light homemaking tasks with Modified Independence.    Baseline Eval: Pt. is not performing home management tasks.    Time 12    Period Weeks    Status New    Target Date 09/28/21                   Plan - 07/25/21 1335     Clinical Impression Statement Pt continues to progress in all areas.  Still has some shoulder pain and describes more as a burning sensation, pain increased with ABD of the arm.  Pt working towards picking up and manipulation of small objects as well as translatory skills of the hand and using the hand for storage.  Continue to work  towards goals in plan of care to maximize safety and independence in necessary daily tasks.    OT Occupational Profile and History Detailed Assessment- Review of Records and additional review of physical, cognitive, psychosocial history related to current functional performance    Occupational  performance deficits (Please refer to evaluation for details): IADL's;ADL's;Leisure    Rehab Potential Good    Clinical Decision Making Several treatment options, min-mod task modification necessary    Comorbidities Affecting Occupational Performance: [place] have comorbidities impacting occupational performance    Modification or Assistance to Complete Evaluation  Min-Moderate modification of tasks or assist with assess necessary to complete eval    OT Frequency 2x / week    OT Duration 12 weeks    OT Treatment/Interventions Self-care/ADL training;Therapeutic exercise;Neuromuscular education;Electrical Stimulation;Moist Heat;Passive range of motion;Therapeutic activities;Patient/family education;DME and/or AE instruction;Functional Mobility Training;Energy conservation;Manual Therapy    Consulted and Agree with Plan of Care Patient             Patient will benefit from skilled therapeutic intervention in order to improve the following deficits and impairments:           Visit Diagnosis: Muscle weakness (generalized)  Other lack of coordination    Problem List Patient Active Problem List   Diagnosis Date Noted   Noncompliance with medication treatment due to intermittent use of medication 11/24/2015   Gastritis 11/24/2015   Allergic rhinitis 09/03/2015   GERD (gastroesophageal reflux disease) 09/03/2015   Other vitamin B12 deficiency anemia 09/10/2012   Diabetic polyneuropathy (HCC) 09/10/2012   Radiculopathy of lumbar region 07/24/2012   History of pulmonary embolism 07/24/2012   Hypertension associated with diabetes (HCC) 04/30/2012   Vasomotor flushing 04/30/2012   Hyperlipidemia with target LDL less than 70 11/16/2011   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 [place], [place], [address] Phone: [phone]   Fax:  [phone]  Name: Johnathan Hernandez MRN: [id number] Date of Birth: [birth year]

## 2021-07-26 ENCOUNTER — Encounter: Payer: Self-pay | Admitting: Occupational Therapy

## 2021-07-26 ENCOUNTER — Other Ambulatory Visit: Payer: Self-pay

## 2021-07-26 ENCOUNTER — Ambulatory Visit: Payer: Medicare Other | Admitting: Occupational Therapy

## 2021-07-26 DIAGNOSIS — M6281 Muscle weakness (generalized): Secondary | ICD-10-CM

## 2021-07-26 DIAGNOSIS — R278 Other lack of coordination: Secondary | ICD-10-CM

## 2021-07-26 NOTE — Therapy (Signed)
[place] [facility] MAIN [facility] SERVICES [address] [place], [place], [address] Phone: [phone]   Fax:  [phone]  Occupational Therapy Treatment  Patient Details  Name: Johnathan Hernandez MRN: [id number] Date of Birth: 09-21-1950 Referring Provider (OT): Mayme Genta   Encounter Date: 07/26/2021   OT End of Session - 07/26/21 1615     Visit Number 5    Number of Visits 24    Date for OT Re-Evaluation 09/28/21    Authorization Time Period Progress reporting period starting 07/06/2021    OT Start Time 1345    OT Stop Time 1430    OT Time Calculation (min) 45 min    Activity Tolerance Patient tolerated treatment well    Behavior During Therapy [facility] for tasks assessed/performed             Past Medical History:  Diagnosis Date   Coronary artery disease    Diabetes mellitus    History of tobacco abuse    quit in 2006 after 5 vessel cabg   Hyperlipidemia    Hypertension    Peripheral vascular disease [facility]) Dec 2012   [facility], treated with extensive arterial bypass    Past Surgical History:  Procedure Laterality Date   AORTA - ILIAC ARTERY BYPASS GRAFT  dec 2012   [facility]   CORONARY ARTERY BYPASS GRAFT  2006   [place]    There were no vitals filed for this visit.   Subjective Assessment - 07/26/21 1614     Subjective  Pt. reports that his rib patch fell off.    Patient is accompanied by: Family member    Pertinent History Pt. is a 70 y.o. male who was diagnosed with a CVA with right sided weakness. Pt. was admitted to [facility], and received inpatient rehab services. Pt.'s PMHx includes: DM Type II, Diabetic peripheral neuropathy, CAD, HTN, Hyperlipidemia, VTE. Pt. enjoys working out in his yard, and fishing.    Currently in Pain? Yes            Therapeutic Ex.   Pt. performed AROM followed by PROM for right shoulder flexion, abduction, horizontal adduction, and abduction, forearm supination following moist heat.  Pt. worked on gross  grip strengthening with the right hand with red, and green band resistance. Pt. Worked on AROM with the wrist maze.    Manual Therapy:   Pt. Tolerated gentle scapular glides in elevation, depression, abduct./rotation. Pt. tolerated gentle soft tissue massage to the musculature of the scapular region secondary increased tone, and tightness. Manual therapy was performed independent of, and in preparation for ROM.   Pt. reports that his patch fell off for his right thoracic region. Pt. reports 4/10 right sided thoracic pain initially upon arrival. Pt. Reported reduced pain following the treatment session. Pt. presents with limited RUE ROM, strength, motor control, and [facility] skills. Pt. required cues to avoid compensation proximally. Pt. responded well to there. Ex., manual therapy, and coordination. Pt. continues to work towards improving right shoulder ROM, strength, motor control, and [facility] skills in order to improve RUE functioning in preparation for ADLs, and IADL tasks.                      OT Education - 07/26/21 1615     Education Details ROM, hand function, reaching    Person(s) Educated Patient    Methods Explanation    Comprehension Verbalized understanding  OT Short Term Goals - 07/06/21 1708       OT SHORT TERM GOAL #1   Title Pt. will demonstrate independence with a HEP for the RUE.    Baseline Eval: No current HEP.    Time 6    Period Weeks    Status New    Target Date 08/17/21               OT Long Term Goals - 07/06/21 1709       OT LONG TERM GOAL #1   Title Pt. will increase his FOTO score by 2 pts. for clinically significant ADL related change.    Baseline EVal: FOTO score: 58, TR: 70    Time 12    Period Weeks    Status New    Target Date 09/28/21      OT LONG TERM GOAL #2   Title Pt. will improve right shoulder flexion by 10 degrees to be able to reach up to access cabinetry, and shelves.    Baseline Eval: Shoulder  flexion: 111(127)    Time 12    Period Weeks    Status New    Target Date 09/28/21      OT LONG TERM GOAL #3   Title Pt. will improve right hand [facility] skills by 5 sec. of speed to assist with buttoning.    Baseline Eval: R: 42 sec., L: 27 sec.    Time 12    Period Weeks    Status New    Target Date 09/28/21      OT LONG TERM GOAL #4   Title Pt. will perform light meal prep with modified independence using using work simplification strategies as needed    Baseline Eval: Pt. is unable    Time 12    Period Weeks    Status New    Target Date 09/28/21      OT LONG TERM GOAL #5   Title Pt. will independently be able to perfrom self-feeding using his right hand    Baseline Eval: Pt. has difficulty performing self-feeding with his right hand    Time 12    Period Weeks    Status New    Target Date 09/28/21      OT LONG TERM GOAL #6   Title Pt. will perform light homemaking tasks with Modified Independence.    Baseline Eval: Pt. is not performing home management tasks.    Time 12    Period Weeks    Status New    Target Date 09/28/21                   Plan - 07/26/21 1616     Clinical Impression Statement Pt. reports that his patch fell off for his right thoracic region. Pt. reports 4/10 right sided thoracic pain initially upon arrival. Pt. Reported reduced pain following the treatment session. Pt. presents with limited RUE ROM, strength, motor control, and [facility] skills. Pt. required cues to avoid compensation proximally. Pt. responded well to there. Ex., manual therapy, and coordination. Pt. continues to work towards improving right shoulder ROM, strength, motor control, and [facility] skills in order to improve RUE functioning in preparation for ADLs, and IADL tasks.           OT Occupational Profile and History Detailed Assessment- Review of Records and additional review of physical, cognitive, psychosocial history related to current functional performance    Occupational  performance deficits (Please refer to evaluation for details):  IADL's;ADL's;Leisure    Rehab Potential Good    Clinical Decision Making Several treatment options, min-mod task modification necessary    Comorbidities Affecting Occupational Performance: [place] have comorbidities impacting occupational performance    Modification or Assistance to Complete Evaluation  Min-Moderate modification of tasks or assist with assess necessary to complete eval    OT Frequency 2x / week    OT Duration 12 weeks    OT Treatment/Interventions Self-care/ADL training;Therapeutic exercise;Neuromuscular education;Electrical Stimulation;Moist Heat;Passive range of motion;Therapeutic activities;Patient/family education;DME and/or AE instruction;Functional Mobility Training;Energy conservation;Manual Therapy    Consulted and Agree with Plan of Care Patient             Patient will benefit from skilled therapeutic intervention in order to improve the following deficits and impairments:           Visit Diagnosis: Muscle weakness (generalized)  Other lack of coordination    Problem List Patient Active Problem List   Diagnosis Date Noted   Noncompliance with medication treatment due to intermittent use of medication 11/24/2015   Gastritis 11/24/2015   Allergic rhinitis 09/03/2015   GERD (gastroesophageal reflux disease) 09/03/2015   Other vitamin B12 deficiency anemia 09/10/2012   Diabetic polyneuropathy (HCC) 09/10/2012   Radiculopathy of lumbar region 07/24/2012   History of pulmonary embolism 07/24/2012   Hypertension associated with diabetes (HCC) 04/30/2012   Vasomotor flushing 04/30/2012   Hyperlipidemia with target LDL less than 70 11/16/2011   Diabetes mellitus with atherosclerosis of arteries of extremities (HCC) 11/14/2011   Peripheral vascular disease (HCC)    History of tobacco abuse    Coronary artery disease     Olegario Messier, MS, OTR/L 07/26/2021, 4:52 PM  [place] [facility] MAIN [facility] SERVICES [address] [place], [place], [address] Phone: [phone]   Fax:  [phone]  Name: Johnathan Hernandez MRN: [id number] Date of Birth: 12-22-1950

## 2021-07-28 ENCOUNTER — Ambulatory Visit: Payer: Medicare Other | Admitting: Occupational Therapy

## 2021-07-28 ENCOUNTER — Other Ambulatory Visit: Payer: Self-pay

## 2021-07-28 DIAGNOSIS — R278 Other lack of coordination: Secondary | ICD-10-CM

## 2021-07-28 DIAGNOSIS — M6281 Muscle weakness (generalized): Secondary | ICD-10-CM

## 2021-07-28 NOTE — Therapy (Signed)
[place] [facility] MAIN [facility] SERVICES [address] [place], [place], [address] Phone: [phone]   Fax:  [phone]  Occupational Therapy Treatment  Patient Details  Name: Johnathan Hernandez MRN: [id number] Date of Birth: Dec 02, 1950 Referring Provider (OT): Mayme Genta   Encounter Date: 07/28/2021   OT End of Session - 07/28/21 1628     Visit Number 6    Number of Visits 24    Date for OT Re-Evaluation 09/28/21    Authorization Time Period Progress reporting period starting 07/06/2021    OT Start Time 1348    OT Stop Time 1430    OT Time Calculation (min) 42 min    Activity Tolerance Patient tolerated treatment well    Behavior During Therapy [facility] for tasks assessed/performed             Past Medical History:  Diagnosis Date   Coronary artery disease    Diabetes mellitus    History of tobacco abuse    quit in 2006 after 5 vessel cabg   Hyperlipidemia    Hypertension    Peripheral vascular disease [facility]) Dec 2012   [facility], treated with extensive arterial bypass    Past Surgical History:  Procedure Laterality Date   AORTA - ILIAC ARTERY BYPASS GRAFT  dec 2012   [facility]   CORONARY ARTERY BYPASS GRAFT  2006       There were no vitals filed for this visit.   Subjective Assessment - 07/28/21 1627     Subjective  Pt. reports  doing okay today.    Patient is accompanied by: Family member    Pertinent History Pt. is a 70 y.o. male who was diagnosed with a CVA with right sided weakness. Pt. was admitted to [facility], and received inpatient rehab services. Pt.'s PMHx includes: DM Type II, Diabetic peripheral neuropathy, CAD, HTN, Hyperlipidemia, VTE. Pt. enjoys working out in his yard, and fishing.    Currently in Pain? No/denies            Therapeutic Ex.   Pt. performed AROM followed by PROM for right shoulder flexion, abduction, horizontal adduction, and abduction, forearm supination following moist heat.  Pt. performed 2# dowel  ex. For UE strengthening secondary to weakness. Pt. Performed tendon glides with the right hand. Pt. Required cues, and visual demonstration for proper technique.  Pt. worked on pink thearputty ex. for hand strengthening. Exercises including: gross gripping, gross digit extension, thumb abduction, lateral, and 3pt. Pinch strengthening, digit abduction, and thumb opposition. Pt. was provided with a visual handout HEP through [employer]. Bilateral shoulder flexion, chest press, circular patterns, and elbow flexion/extension were performed.     Manual Therapy:   Pt. Tolerated gentle scapular glides in elevation, depression, abduct./rotation. Pt. tolerated gentle soft tissue massage to the musculature of the scapular region secondary increased tone, and tightness. Manual therapy was performed independent of, and in preparation for ROM.   Pt. Reports that he has been having numbness, and tingling in his right hand. Pt. presents with limited RUE ROM, strength, motor control, and [facility] skills. Pt. required cues to avoid compensation proximally. Pt. required cues, and visual demonstration for proper technique.  Pt. responded well to there. Ex., manual therapy, and coordination. Pt. continues to work towards improving right shoulder ROM, strength, motor control, and [facility] skills in order to improve RUE functioning in preparation for ADLs, and IADL tasks.  OT Education - 07/28/21 1628     Education Details ROM, hand function, reaching    Person(s) Educated Patient    Methods Explanation    Comprehension Verbalized understanding              OT Short Term Goals - 07/06/21 1708       OT SHORT TERM GOAL #1   Title Pt. will demonstrate independence with a HEP for the RUE.    Baseline Eval: No current HEP.    Time 6    Period Weeks    Status New    Target Date 08/17/21               OT Long Term Goals - 07/06/21 1709       OT LONG TERM GOAL #1   Title Pt.  will increase his FOTO score by 2 pts. for clinically significant ADL related change.    Baseline EVal: FOTO score: 58, TR: 70    Time 12    Period Weeks    Status New    Target Date 09/28/21      OT LONG TERM GOAL #2   Title Pt. will improve right shoulder flexion by 10 degrees to be able to reach up to access cabinetry, and shelves.    Baseline Eval: Shoulder flexion: 111(127)    Time 12    Period Weeks    Status New    Target Date 09/28/21      OT LONG TERM GOAL #3   Title Pt. will improve right hand [facility] skills by 5 sec. of speed to assist with buttoning.    Baseline Eval: R: 42 sec., L: 27 sec.    Time 12    Period Weeks    Status New    Target Date 09/28/21      OT LONG TERM GOAL #4   Title Pt. will perform light meal prep with modified independence using using work simplification strategies as needed    Baseline Eval: Pt. is unable    Time 12    Period Weeks    Status New    Target Date 09/28/21      OT LONG TERM GOAL #5   Title Pt. will independently be able to perfrom self-feeding using his right hand    Baseline Eval: Pt. has difficulty performing self-feeding with his right hand    Time 12    Period Weeks    Status New    Target Date 09/28/21      OT LONG TERM GOAL #6   Title Pt. will perform light homemaking tasks with Modified Independence.    Baseline Eval: Pt. is not performing home management tasks.    Time 12    Period Weeks    Status New    Target Date 09/28/21                   Plan - 07/28/21 1628     Clinical Impression Statement Pt. Reports that he has been having numbness, and tingling in his right hand. Pt. presents with limited RUE ROM, strength, motor control, and [facility] skills. Pt. required cues to avoid compensation proximally. Pt. required cues, and visual demonstration for proper technique.  Pt. responded well to there. Ex., manual therapy, and coordination. Pt. continues to work towards improving right shoulder ROM, strength,  motor control, and [facility] skills in order to improve RUE functioning in preparation for ADLs, and IADL tasks.    OT Occupational Profile  and History Detailed Assessment- Review of Records and additional review of physical, cognitive, psychosocial history related to current functional performance    Occupational performance deficits (Please refer to evaluation for details): IADL's;ADL's;Leisure    Rehab Potential Good    Clinical Decision Making Several treatment options, min-mod task modification necessary    Comorbidities Affecting Occupational Performance: [place] have comorbidities impacting occupational performance    Modification or Assistance to Complete Evaluation  Min-Moderate modification of tasks or assist with assess necessary to complete eval    OT Frequency 2x / week    OT Duration 12 weeks    OT Treatment/Interventions Self-care/ADL training;Therapeutic exercise;Neuromuscular education;Electrical Stimulation;Moist Heat;Passive range of motion;Therapeutic activities;Patient/family education;DME and/or AE instruction;Functional Mobility Training;Energy conservation;Manual Therapy    Consulted and Agree with Plan of Care Patient             Patient will benefit from skilled therapeutic intervention in order to improve the following deficits and impairments:           Visit Diagnosis: Muscle weakness (generalized)  Other lack of coordination    Problem List Patient Active Problem List   Diagnosis Date Noted   Noncompliance with medication treatment due to intermittent use of medication 11/24/2015   Gastritis 11/24/2015   Allergic rhinitis 09/03/2015   GERD (gastroesophageal reflux disease) 09/03/2015   Other vitamin B12 deficiency anemia 09/10/2012   Diabetic polyneuropathy (HCC) 09/10/2012   Radiculopathy of lumbar region 07/24/2012   History of pulmonary embolism 07/24/2012   Hypertension associated with diabetes (HCC) 04/30/2012   Vasomotor flushing 04/30/2012    Hyperlipidemia with target LDL less than 70 11/16/2011   Diabetes mellitus with atherosclerosis of arteries of extremities (HCC) 11/14/2011   Peripheral vascular disease (HCC)    History of tobacco abuse    Coronary artery disease     Olegario Messier, MS, OTR/L 07/28/2021, 4:31 PM  [place] [facility] [facility] MAIN [facility] SERVICES [address] [place], [place], [address] Phone: [phone]   Fax:  [phone]  Name: Fredrik Mogel Leavey MRN: [id number] Date of Birth: Rance 22, 1952

## 2021-08-02 ENCOUNTER — Other Ambulatory Visit: Payer: Self-pay

## 2021-08-02 ENCOUNTER — Ambulatory Visit: Payer: Medicare Other | Admitting: Occupational Therapy

## 2021-08-02 ENCOUNTER — Encounter: Payer: Self-pay | Admitting: Occupational Therapy

## 2021-08-02 DIAGNOSIS — R278 Other lack of coordination: Secondary | ICD-10-CM

## 2021-08-02 DIAGNOSIS — M6281 Muscle weakness (generalized): Secondary | ICD-10-CM

## 2021-08-02 NOTE — Therapy (Signed)
[facility] MAIN [facility] SERVICES [address] [place], [place], [address] Phone: [phone]   Fax:  [phone]  Occupational Therapy Treatment  Patient Details  Name: Johnathan Hernandez MRN: [id number] Date of Birth: 09/01/1950 Referring Provider (OT): Mayme Genta   Encounter Date: 08/02/2021   OT End of Session - 08/02/21 1544     Visit Number 7    Number of Visits 24    Date for OT Re-Evaluation 09/28/21    Authorization Time Period Progress reporting period starting 07/06/2021    OT Start Time 1300    OT Stop Time 1345    OT Time Calculation (min) 45 min    Activity Tolerance Patient tolerated treatment well    Behavior During Therapy [facility] for tasks assessed/performed             Past Medical History:  Diagnosis Date   Coronary artery disease    Diabetes mellitus    History of tobacco abuse    quit in 2006 after 5 vessel cabg   Hyperlipidemia    Hypertension    Peripheral vascular disease [facility]) Dec 2012   [facility], treated with extensive arterial bypass    Past Surgical History:  Procedure Laterality Date   AORTA - ILIAC ARTERY BYPASS GRAFT  dec 2012   [facility]   CORONARY ARTERY BYPASS GRAFT  2006   [facility]    There were no vitals filed for this visit.   Subjective Assessment - 08/02/21 1543     Subjective  Pt. reports  doing okay today, but had a few rough days over the weekend.   Patient is accompanied by: Family member    Pertinent History Pt. is a 70 y.o. male who was diagnosed with a CVA with right sided weakness. Pt. was admitted to [facility], and received inpatient rehab services. Pt.'s PMHx includes: DM Type II, Diabetic peripheral neuropathy, CAD, HTN, Hyperlipidemia, VTE. Pt. enjoys working out in his yard, and fishing.    Patient Stated Goals Pt would like to be able to use his hand and arm without any restrictions    Currently in Pain? No/denies            Therapeutic Ex.   Pt. performed AROM followed by  PROM/AAROM/AROM for right shoulder flexion, abduction, horizontal adduction, and abduction, forearm supination following moist heat.      Manual Therapy:   Pt. tolerated gentle scapular glides in elevation, depression, abduct./rotation. Pt. tolerated gentle soft tissue massage to the musculature of the scapular region secondary increased tone, and tightness. Manual therapy was performed independent of, and in preparation for ROM.  Neuromuscular re-education:   Pt. worked on grasping 1" resistive cubes alternating thumb opposition to the tip of the 2nd through 5th digits while the board is placed at a vertical angle. Pt. worked on pressing the cubes back into place while alternating isolated 2nd through 5th digit extension.    Pt. reports that he has been having more  numbness, and tingling in the right side of his face, and hand over the weekend due to the cold weather. Pt. presents with limited RUE ROM, strength, motor control, and [facility] skills. Pt. required cues to avoid compensation proximally. Pt. required cues, and visual demonstration for proper technique. Pt. responded well to ther. Ex., manual therapy, and coordination. Pt. continues to work towards improving right shoulder ROM, strength, motor control, and [facility] skills in order to improve RUE functioning in preparation for ADLs, and IADL tasks.  OT Education - 08/02/21 1544     Education Details ROM, hand function, reaching    Person(s) Educated Patient    Methods Explanation    Comprehension Verbalized understanding              OT Short Term Goals - 07/06/21 1708       OT SHORT TERM GOAL #1   Title Pt. will demonstrate independence with a HEP for the RUE.    Baseline Eval: No current HEP.    Time 6    Period Weeks    Status New    Target Date 08/17/21               OT Long Term Goals - 07/06/21 1709       OT LONG TERM GOAL #1   Title Pt. will increase his FOTO score by 2  pts. for clinically significant ADL related change.    Baseline EVal: FOTO score: 58, TR: 70    Time 12    Period Weeks    Status New    Target Date 09/28/21      OT LONG TERM GOAL #2   Title Pt. will improve right shoulder flexion by 10 degrees to be able to reach up to access cabinetry, and shelves.    Baseline Eval: Shoulder flexion: 111(127)    Time 12    Period Weeks    Status New    Target Date 09/28/21      OT LONG TERM GOAL #3   Title Pt. will improve right hand [facility] skills by 5 sec. of speed to assist with buttoning.    Baseline Eval: R: 42 sec., L: 27 sec.    Time 12    Period Weeks    Status New    Target Date 09/28/21      OT LONG TERM GOAL #4   Title Pt. will perform light meal prep with modified independence using using work simplification strategies as needed    Baseline Eval: Pt. is unable    Time 12    Period Weeks    Status New    Target Date 09/28/21      OT LONG TERM GOAL #5   Title Pt. will independently be able to perfrom self-feeding using his right hand    Baseline Eval: Pt. has difficulty performing self-feeding with his right hand    Time 12    Period Weeks    Status New    Target Date 09/28/21      OT LONG TERM GOAL #6   Title Pt. will perform light homemaking tasks with Modified Independence.    Baseline Eval: Pt. is not performing home management tasks.    Time 12    Period Weeks    Status New    Target Date 09/28/21                   Plan - 08/02/21 1545     Clinical Impression Statement Pt. reports that he has been having more  numbness, and tingling in the right side of his face, and hand over the weekend due to the cold weather. Pt. presents with limited RUE ROM, strength, motor control, and [facility] skills. Pt. required cues to avoid compensation proximally. Pt. required cues, and visual demonstration for proper technique. Pt. responded well to ther. Ex., manual therapy, and coordination. Pt. continues to work towards improving  right shoulder ROM, strength, motor control, and [facility] skills in order to improve RUE  functioning in preparation for ADLs, and IADL tasks.    OT Occupational Profile and History Detailed Assessment- Review of Records and additional review of physical, cognitive, psychosocial history related to current functional performance    Occupational performance deficits (Please refer to evaluation for details): IADL's;ADL's;Leisure    Rehab Potential Good    Clinical Decision Making Several treatment options, min-mod task modification necessary    Comorbidities Affecting Occupational Performance: [place] have comorbidities impacting occupational performance    Modification or Assistance to Complete Evaluation  Min-Moderate modification of tasks or assist with assess necessary to complete eval    OT Frequency 2x / week    OT Duration 12 weeks    OT Treatment/Interventions Self-care/ADL training;Therapeutic exercise;Neuromuscular education;Electrical Stimulation;Moist Heat;Passive range of motion;Therapeutic activities;Patient/family education;DME and/or AE instruction;Functional Mobility Training;Energy conservation;Manual Therapy    Consulted and Agree with Plan of Care Patient             Patient will benefit from skilled therapeutic intervention in order to improve the following deficits and impairments:           Visit Diagnosis: Muscle weakness (generalized)  Other lack of coordination    Problem List Patient Active Problem List   Diagnosis Date Noted   Noncompliance with medication treatment due to intermittent use of medication 11/24/2015   Gastritis 11/24/2015   Allergic rhinitis 09/03/2015   GERD (gastroesophageal reflux disease) 09/03/2015   Other vitamin B12 deficiency anemia 09/10/2012   Diabetic polyneuropathy (HCC) 09/10/2012   Radiculopathy of lumbar region 07/24/2012   History of pulmonary embolism 07/24/2012   Hypertension associated with diabetes (HCC) 04/30/2012    Vasomotor flushing 04/30/2012   Hyperlipidemia with target LDL less than 70 11/16/2011   Diabetes mellitus with atherosclerosis of arteries of extremities (HCC) 11/14/2011   Peripheral vascular disease (HCC)    History of tobacco abuse    Coronary artery disease     Olegario Messier, MS, OTR/L 08/02/2021, 3:47 PM  [facility] MAIN [facility] SERVICES [address] [place], [place], [address] Phone: [phone]   Fax:  [phone]  Name: Johnathan Hernandez MRN: [id number] Date of Birth: 07/08/1951

## 2021-08-05 ENCOUNTER — Ambulatory Visit: Payer: Medicare Other | Admitting: Occupational Therapy

## 2021-08-11 ENCOUNTER — Ambulatory Visit: Payer: Medicare Other | Admitting: Occupational Therapy

## 2021-08-11 ENCOUNTER — Other Ambulatory Visit: Payer: Self-pay

## 2021-08-11 ENCOUNTER — Encounter: Payer: Self-pay | Admitting: Occupational Therapy

## 2021-08-11 DIAGNOSIS — M6281 Muscle weakness (generalized): Secondary | ICD-10-CM | POA: Diagnosis not present

## 2021-08-11 NOTE — Therapy (Signed)
[place] [facility] MAIN [facility] SERVICES [address] [place], [place], [address] Phone: [phone]   Fax:  [phone]  Occupational Therapy Treatment  Patient Details  Name: Johnathan Hernandez MRN: [id number] Date of Birth: [birth year] Referring Provider (OT): Mayme Genta   Encounter Date: 08/11/2021   OT End of Session - 08/11/21 1406     Visit Number 8    Number of Visits 24    Date for OT Re-Evaluation 09/28/21    Authorization Time Period Progress reporting period starting 07/06/2021    OT Start Time 1300    OT Stop Time 1345    OT Time Calculation (min) 45 min    Activity Tolerance Patient tolerated treatment well    Behavior During Therapy [facility] for tasks assessed/performed             Past Medical History:  Diagnosis Date   Coronary artery disease    Diabetes mellitus    History of tobacco abuse    quit in 2006 after 5 vessel cabg   Hyperlipidemia    Hypertension    Peripheral vascular disease [facility]) Dec 2012   [facility], treated with extensive arterial bypass    Past Surgical History:  Procedure Laterality Date   AORTA - ILIAC ARTERY BYPASS GRAFT  dec 2012   [facility]   CORONARY ARTERY BYPASS GRAFT  2006   [place]    There were no vitals filed for this visit.   Subjective Assessment - 08/11/21 1406     Subjective  Pt. reports having had a nice Christmas with his Granddaughter    Patient is accompanied by: Family member    Pertinent History Pt. is a 69 y.o. male who was diagnosed with a CVA with right sided weakness. Pt. was admitted to [facility], and received inpatient rehab services. Pt.'s PMHx includes: DM Type II, Diabetic peripheral neuropathy, CAD, HTN, Hyperlipidemia, VTE. Pt. enjoys working out in his yard, and fishing.    Patient Stated Goals Pt would like to be able to use his hand and arm without any restrictions    Currently in Pain? Yes    Pain Score 4     Pain Location Hand    Pain Orientation Right    Pain Descriptors /  Indicators Tingling;Numbness   cold pain   Pain Type Chronic pain    Multiple Pain Sites Yes    Pain Score 4    Pain Location Jaw    Pain Orientation Right    Pain Descriptors / Indicators Aching   Cold pain   Pain Type Chronic pain            Therapeutic Ex.   Pt. performed AROM followed by PROM for right shoulder flexion, abduction, horizontal adduction, and abduction, forearm supination following moist heat.  Pt. performed 1.5# dowel ex. For UE strengthening secondary to weakness. Pt. Performed 1# dumbbell ex. for elbow flexion and extension, forearm supination/pronation, wrist flexion/extension, and radial deviation. Pt. required verbal cues, and visual demonstration for proper technique.    Manual Therapy:   Pt. Tolerated gentle scapular glides in elevation, depression, abduct./rotation. Pt. tolerated gentle soft tissue massage to the musculature of the scapular region secondary increased tone, and tightness. Manual therapy was performed independent of, and in preparation for ROM.   Pt. reports that he has been having a cold pain in the right side of his jaw, and right hand, as well as numbness, and tingling in the right hand. Pt. presents with limited  RUE ROM, strength, motor control, and [facility] skills. Pt. required cues to avoid compensation proximally. Pt. required cues, and visual demonstration for proper technique.  Pt. responded well to there. Ex., manual therapy, and coordination. Pt. continues to work towards improving right shoulder ROM, strength, motor control, and [facility] skills in order to improve RUE functioning in preparation for ADLs, and IADL tasks.                         OT Education - 08/11/21 1406     Education Details ROM, hand function, reaching    Person(s) Educated Patient    Methods Explanation    Comprehension Verbalized understanding              OT Short Term Goals - 07/06/21 1708       OT SHORT TERM GOAL #1   Title Pt. will  demonstrate independence with a HEP for the RUE.    Baseline Eval: No current HEP.    Time 6    Period Weeks    Status New    Target Date 08/17/21               OT Long Term Goals - 07/06/21 1709       OT LONG TERM GOAL #1   Title Pt. will increase his FOTO score by 2 pts. for clinically significant ADL related change.    Baseline EVal: FOTO score: 58, TR: 70    Time 12    Period Weeks    Status New    Target Date 09/28/21      OT LONG TERM GOAL #2   Title Pt. will improve right shoulder flexion by 10 degrees to be able to reach up to access cabinetry, and shelves.    Baseline Eval: Shoulder flexion: 111(127)    Time 12    Period Weeks    Status New    Target Date 09/28/21      OT LONG TERM GOAL #3   Title Pt. will improve right hand [facility] skills by 5 sec. of speed to assist with buttoning.    Baseline Eval: R: 42 sec., L: 27 sec.    Time 12    Period Weeks    Status New    Target Date 09/28/21      OT LONG TERM GOAL #4   Title Pt. will perform light meal prep with modified independence using using work simplification strategies as needed    Baseline Eval: Pt. is unable    Time 12    Period Weeks    Status New    Target Date 09/28/21      OT LONG TERM GOAL #5   Title Pt. will independently be able to perfrom self-feeding using his right hand    Baseline Eval: Pt. has difficulty performing self-feeding with his right hand    Time 12    Period Weeks    Status New    Target Date 09/28/21      OT LONG TERM GOAL #6   Title Pt. will perform light homemaking tasks with Modified Independence.    Baseline Eval: Pt. is not performing home management tasks.    Time 12    Period Weeks    Status New    Target Date 09/28/21                   Plan - 08/11/21 1408     Clinical Impression Statement Pt.  reports that he has been having a cold pain in the right side of his jaw, and right hand, as well as numbness, and tingling in the right hand. Pt. presents  with limited RUE ROM, strength, motor control, and [facility] skills. Pt. required cues to avoid compensation proximally. Pt. required cues, and visual demonstration for proper technique.  Pt. responded well to there. Ex., manual therapy, and coordination. Pt. continues to work towards improving right shoulder ROM, strength, motor control, and [facility] skills in order to improve RUE functioning in preparation for ADLs, and IADL tasks.         OT Occupational Profile and History Detailed Assessment- Review of Records and additional review of physical, cognitive, psychosocial history related to current functional performance    Occupational performance deficits (Please refer to evaluation for details): IADL's;ADL's;Leisure    Rehab Potential Good    Clinical Decision Making Several treatment options, min-mod task modification necessary    Comorbidities Affecting Occupational Performance: [place] have comorbidities impacting occupational performance    Modification or Assistance to Complete Evaluation  Min-Moderate modification of tasks or assist with assess necessary to complete eval    OT Frequency 2x / week    OT Duration 12 weeks    OT Treatment/Interventions Self-care/ADL training;Therapeutic exercise;Neuromuscular education;Electrical Stimulation;Moist Heat;Passive range of motion;Therapeutic activities;Patient/family education;DME and/or AE instruction;Functional Mobility Training;Energy conservation;Manual Therapy    Consulted and Agree with Plan of Care Patient             Patient will benefit from skilled therapeutic intervention in order to improve the following deficits and impairments:           Visit Diagnosis: Muscle weakness (generalized)    Problem List Patient Active Problem List   Diagnosis Date Noted   Noncompliance with medication treatment due to intermittent use of medication 11/24/2015   Gastritis 11/24/2015   Allergic rhinitis 09/03/2015   GERD (gastroesophageal reflux  disease) 09/03/2015   Other vitamin B12 deficiency anemia 09/10/2012   Diabetic polyneuropathy (HCC) 09/10/2012   Radiculopathy of lumbar region 07/24/2012   History of pulmonary embolism 07/24/2012   Hypertension associated with diabetes (HCC) 04/30/2012   Vasomotor flushing 04/30/2012   Hyperlipidemia with target LDL less than 70 11/16/2011   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 [place], [place], [address] Phone: [phone]   Fax:  [phone]  Name: Johnathan Hernandez MRN: [id number] Date of Birth: 11/10/1950

## 2021-08-17 ENCOUNTER — Ambulatory Visit: Payer: Medicare Other | Admitting: Occupational Therapy

## 2021-08-17 ENCOUNTER — Ambulatory Visit: Payer: Medicare Other | Admitting: Physical Therapy

## 2021-08-19 ENCOUNTER — Other Ambulatory Visit: Payer: Self-pay

## 2021-08-19 ENCOUNTER — Ambulatory Visit: Payer: Medicare Other | Admitting: Physical Therapy

## 2021-08-19 ENCOUNTER — Encounter: Payer: Self-pay | Admitting: Physical Therapy

## 2021-08-19 ENCOUNTER — Ambulatory Visit: Payer: Medicare Other | Attending: Student | Admitting: Occupational Therapy

## 2021-08-19 ENCOUNTER — Encounter: Payer: Self-pay | Admitting: Occupational Therapy

## 2021-08-19 DIAGNOSIS — R2681 Unsteadiness on feet: Secondary | ICD-10-CM

## 2021-08-19 DIAGNOSIS — R2689 Other abnormalities of gait and mobility: Secondary | ICD-10-CM | POA: Diagnosis present

## 2021-08-19 DIAGNOSIS — R269 Unspecified abnormalities of gait and mobility: Secondary | ICD-10-CM

## 2021-08-19 DIAGNOSIS — R262 Difficulty in walking, not elsewhere classified: Secondary | ICD-10-CM | POA: Insufficient documentation

## 2021-08-19 DIAGNOSIS — M6281 Muscle weakness (generalized): Secondary | ICD-10-CM | POA: Insufficient documentation

## 2021-08-19 DIAGNOSIS — R278 Other lack of coordination: Secondary | ICD-10-CM | POA: Diagnosis present

## 2021-08-19 NOTE — Therapy (Signed)
[facility] MAIN [facility] SERVICES [address] [place], [place], [address] Phone: [phone]   Fax:  [phone]  Occupational Therapy Treatment  Patient Details  Name: Johnathan Hernandez Cu MRN: [id number] Date of Birth: [birth year] Referring Provider (OT): Mayme Genta   Encounter Date: 08/19/2021   OT End of Session - 08/19/21 1714     Visit Number 9    Number of Visits 24    Date for OT Re-Evaluation 09/28/21    Authorization Time Period Progress reporting period starting 07/06/2021    OT Start Time 1545    OT Stop Time 1630    OT Time Calculation (min) 45 min    Activity Tolerance Patient tolerated treatment well    Behavior During Therapy [facility] for tasks assessed/performed             Past Medical History:  Diagnosis Date   Coronary artery disease    Diabetes mellitus    History of tobacco abuse    quit in 2006 after 5 vessel cabg   Hyperlipidemia    Hypertension    Peripheral vascular disease [facility]) Dec 2012   [facility], treated with extensive arterial bypass    Past Surgical History:  Procedure Laterality Date   AORTA - ILIAC ARTERY BYPASS GRAFT  dec 2012   [facility]   CORONARY ARTERY BYPASS GRAFT  2006   [place]    There were no vitals filed for this visit.   Subjective Assessment - 08/19/21 1713     Subjective  Pt. reports misisng his last appointment due the not feeling well.    Patient is accompanied by: Family member    Pertinent History Pt. is a 71 y.o. male who was diagnosed with a CVA with right sided weakness. Pt. was admitted to [facility], and received inpatient rehab services. Pt.'s PMHx includes: DM Type II, Diabetic peripheral neuropathy, CAD, HTN, Hyperlipidemia, VTE. Pt. enjoys working out in his yard, and fishing.    Currently in Pain? Yes    Pain Score 4     Pain Location Shoulder    Pain Orientation Right    Pain Descriptors / Indicators Aching;Tightness    Pain Type Chronic pain    Pain Score 4    Pain Location  Face    Pain Orientation Right    Pain Descriptors / Indicators Aching    Pain Type Chronic pain            Therapeutic Ex.   Pt. performed AROM followed by PROM for right shoulder flexion, abduction, horizontal adduction, and abduction, forearm supination following moist heat.  Pt. performed 1.5# dowel ex. For UE strengthening secondary to weakness. Pt. worked on [employer], and reciprocal motion using the UBE while seated for 8 min. with minimal resistance. Constant monitoring was provided. Pt. Performed Bilateral gross gripping with a gross grip strengthener. Pt. worked on sustaining grip while grasping pegs and reaching at various heights. The Gripper was set to  23.4#, followed by a rep of 17.9# of grip strength resistance for the right.    Manual Therapy:   Pt. tolerated gentle scapular glides in elevation, depression, abduct./rotation. Pt. tolerated gentle soft tissue massage to the musculature of the scapular region secondary increased tone, and tightness. Manual therapy was performed independent of, and in preparation for ROM.   Pt. reports that he has been having a cold pain in the right side of his face, eye, and right hand, as well as numbness, and tingling in  the right hand. Pt. Reports right sided rib pain, and right shoulder pain. Pt. presents with limited RUE ROM, strength, motor control, and [facility] skills. Pt. required cues to avoid compensation proximally. Pt. required cues, and visual demonstration for proper technique.  Pt. responded well to there. Ex., manual therapy, and coordination. Pt. continues to work towards improving right shoulder ROM, strength, motor control, and [facility] skills in order to improve RUE functioning in preparation for ADLs, and IADL tasks.                           OT Education - 08/19/21 1714     Education Details ROM, strength, grip strength    Person(s) Educated Patient    Methods Explanation    Comprehension Verbalized  understanding              OT Short Term Goals - 07/06/21 1708       OT SHORT TERM GOAL #1   Title Pt. will demonstrate independence with a HEP for the RUE.    Baseline Eval: No current HEP.    Time 6    Period Weeks    Status New    Target Date 08/17/21               OT Long Term Goals - 07/06/21 1709       OT LONG TERM GOAL #1   Title Pt. will increase his FOTO score by 2 pts. for clinically significant ADL related change.    Baseline EVal: FOTO score: 58, TR: 70    Time 12    Period Weeks    Status New    Target Date 09/28/21      OT LONG TERM GOAL #2   Title Pt. will improve right shoulder flexion by 10 degrees to be able to reach up to access cabinetry, and shelves.    Baseline Eval: Shoulder flexion: 111(127)    Time 12    Period Weeks    Status New    Target Date 09/28/21      OT LONG TERM GOAL #3   Title Pt. will improve right hand [facility] skills by 5 sec. of speed to assist with buttoning.    Baseline Eval: R: 42 sec., L: 27 sec.    Time 12    Period Weeks    Status New    Target Date 09/28/21      OT LONG TERM GOAL #4   Title Pt. will perform light meal prep with modified independence using using work simplification strategies as needed    Baseline Eval: Pt. is unable    Time 12    Period Weeks    Status New    Target Date 09/28/21      OT LONG TERM GOAL #5   Title Pt. will independently be able to perfrom self-feeding using his right hand    Baseline Eval: Pt. has difficulty performing self-feeding with his right hand    Time 12    Period Weeks    Status New    Target Date 09/28/21      OT LONG TERM GOAL #6   Title Pt. will perform light homemaking tasks with Modified Independence.    Baseline Eval: Pt. is not performing home management tasks.    Time 12    Period Weeks    Status New    Target Date 09/28/21  Plan - 08/19/21 1715     Clinical Impression Statement Pt. reports that he has been having a cold  pain in the right side of his face, eye, and right hand, as well as numbness, and tingling in the right hand. Pt. Reports right sided rib pain, and right shoulder pain. Pt. presents with limited RUE ROM, strength, motor control, and [facility] skills. Pt. required cues to avoid compensation proximally. Pt. required cues, and visual demonstration for proper technique.  Pt. responded well to there. Ex., manual therapy, and coordination. Pt. continues to work towards improving right shoulder ROM, strength, motor control, and [facility] skills in order to improve RUE functioning in preparation for ADLs, and IADL tasks.    OT Occupational Profile and History Detailed Assessment- Review of Records and additional review of physical, cognitive, psychosocial history related to current functional performance    Occupational performance deficits (Please refer to evaluation for details): IADL's;ADL's;Leisure    Rehab Potential Good    Clinical Decision Making Several treatment options, min-mod task modification necessary    Comorbidities Affecting Occupational Performance: Crupi have comorbidities impacting occupational performance    Modification or Assistance to Complete Evaluation  Min-Moderate modification of tasks or assist with assess necessary to complete eval    OT Frequency 2x / week    OT Duration 12 weeks    OT Treatment/Interventions Self-care/ADL training;Therapeutic exercise;Neuromuscular education;Electrical Stimulation;Moist Heat;Passive range of motion;Therapeutic activities;Patient/family education;DME and/or AE instruction;Functional Mobility Training;Energy conservation;Manual Therapy    Consulted and Agree with Plan of Care Patient             Patient will benefit from skilled therapeutic intervention in order to improve the following deficits and impairments:           Visit Diagnosis: Muscle weakness (generalized)    Problem List Patient Active Problem List   Diagnosis Date Noted    Noncompliance with medication treatment due to intermittent use of medication 11/24/2015   Gastritis 11/24/2015   Allergic rhinitis 09/03/2015   GERD (gastroesophageal reflux disease) 09/03/2015   Other vitamin B12 deficiency anemia 09/10/2012   Diabetic polyneuropathy (HCC) 09/10/2012   Radiculopathy of lumbar region 07/24/2012   History of pulmonary embolism 07/24/2012   Hypertension associated with diabetes (HCC) 04/30/2012   Vasomotor flushing 04/30/2012   Hyperlipidemia with target LDL less than 70 11/16/2011   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

## 2021-08-19 NOTE — Therapy (Signed)
[facility] [address] [place], [place], [address] Phone: [phone]   Fax:  [phone]  Physical Therapy Treatment  Patient Details  Name: Johnathan Hernandez MRN: [id number] Date of Birth: 11/10/1950 No data recorded  Encounter Date: 08/19/2021   PT End of Session - 08/19/21 1515     Visit Number 1    Number of Visits 24    Date for PT Re-Evaluation 11/11/21    PT Start Time 1445    PT Stop Time 1545    PT Time Calculation (min) 60 min    Equipment Utilized During Treatment Gait belt    Activity Tolerance Patient tolerated treatment well             Past Medical History:  Diagnosis Date   Coronary artery disease    Diabetes mellitus    History of tobacco abuse    quit in 2006 after 5 vessel cabg   Hyperlipidemia    Hypertension    Peripheral vascular disease [facility](HCC) Dec 2012   [facility], treated with extensive arterial bypass    Past Surgical History:  Procedure Laterality Date   AORTA - ILIAC ARTERY BYPASS GRAFT  dec 2012   [facility]   CORONARY ARTERY BYPASS GRAFT  2006   [place]    There were no vitals filed for this visit.   Subjective Assessment - 08/19/21 1446     Subjective Pt reports he had a stroke on May 21, 2021. Pt reports he went to hospital multiple times prior to his stroke. Pt reports numbness and pain  in his foot, and in his R hand, right arm and in the right side of his face. Pt report increased pain and discomfort in his right side when it gets cold outside. Pt reports he used to perform yard work such as mowing, getting up leaves, gardening and other outdoor activities. Pt reports his DTR is staying with him now and helping out as needed. reports he would like to be independent where she does not have to stay with him.  Pt reports one fall when he was entering his house and his leg gave out (R) and her reports losing his balance once in his bathroom (second fall) and one stumble but was able to  catch himself to prevent him from falling in the past several months. Relevant history of T2 DM, neuropathy, CAD, HTN and hyperlipedemia.    Patient is accompained by: Family member    Pertinent History Pt reports he had a stroke on May 21, 2021. Pt reports he went to hospital multiple times prior to his stroke. Pt reports numbness and pain  in his foot, and in his R hand, right arm and in the right side of his face. Pt report increased pain and discomfort in his right side when it gets cold outside. Pt reports he used to perform yard work such as mowing, getting up leaves, gardening and other outdoor activities. Pt reports his DTR is staying with him now and helping out as needed. reports he would like to be independent where she does not have to stay with him.  Pt reports one fall when he was entering his house and his leg gave out (R) and her reports losing his balance once in his bathroom (second fall) and one stumble but was able to catch himself to prevent him from falling in the past several months. Relevant history of T2 DM, neuropathy, CAD, HTN and hyperlipedemia.  Limitations Sitting    How long can you sit comfortably? n/a    How long can you stand comfortably? 5 min    How long can you walk comfortably? 50 feet in house to bathroom, to car for appointments    Patient Stated Goals Balance, strenght, mobility, prevent falls    Currently in Pain? Yes    Pain Score 6     Pain Location Back    Pain Orientation Right    Pain Descriptors / Indicators Aching;Tightness    Pain Type Chronic pain    Pain Onset More than a month ago    Pain Frequency Constant                [facility] PT Assessment - 08/20/21 0001       Assessment   Medical Diagnosis CVA    Onset Date/Surgical Date 05/21/21    Hand Dominance Right    Next MD Visit 07/19/21    Prior Therapy Inpatient      Precautions   Precautions Fall      Restrictions   Weight Bearing Restrictions No      Home Environment    Living Environment Private residence    Available Help at Discharge Family    Type of Home House    Home Layout One level    Home Equipment Walker - 4 wheels;Shower seat      Prior Function   Level of Independence Independent    Vocation Retired    Leisure [employer], fishing, good shopping      Standardized Balance Assessment   Standardized Balance Assessment Research scientist (life sciences)   Sit to Stand Able to stand  independently using hands    Standing Unsupported Able to stand safely 2 minutes    Sitting with Back Unsupported but Feet Supported on Floor or Stool Able to sit safely and securely 2 minutes    Stand to Sit Controls descent by using hands    Transfers Able to transfer safely, definite need of hands    Standing Unsupported with Eyes Closed Able to stand 10 seconds safely    Standing Unsupported with Feet Together Able to place feet together independently and stand for 1 minute with supervision    From Standing, Reach Forward with Outstretched Arm Can reach confidently >25 cm (10")    From Standing Position, Pick up Object from Floor Able to pick up shoe, needs supervision    From Standing Position, Turn to Look Behind Over each Shoulder Looks behind one side only/other side shows less weight shift    Turn 360 Degrees Able to turn 360 degrees safely but slowly    Standing Unsupported, Alternately Place Feet on Step/Stool Able to complete 4 steps without aid or supervision    Standing Unsupported, One Foot in Front Able to take small step independently and hold 30 seconds    Standing on One Leg Able to lift leg independently and hold equal to or more than 3 seconds    Total Score 42                  STRENGTH:  Graded on a 0-5 scale Muscle Group Left Right  Shoulder flex    Shoulder Abd    Shoulder Ext    Shoulder IR/ER    Elbow    Wrist/hand    Hip Flex 4- 4  Hip Abd 4- 4  Hip Add 5 5  Hip  Ext    Hip IR/ER    Knee Flex 4- 4  Knee  Ext 4- 4  Ankle DF 4 4  Ankle PF 5 5    GAIT: with prolonged walking/standing pt began to feel heaviness in R LE and gait began to deteriorate (decreased step length, slowed speed, etc) pt reports this happens often since stroke.   OUTCOME MEASURES: TEST Outcome Interpretation  5 times sit<>stand 34.35 sec with B UE use  >60 yo, >15 sec indicates increased risk for falls  10 meter walk test         .71        m/s with RW <1.0 m/s indicates increased risk for falls; limited community ambulator  Timed up and Go         23.95        sec with B UE use for sit to stand portion and using RW <14 sec indicates increased risk for falls      Berg Balance Assessment 42 <36/56 (100% risk for falls), 37-45 (80% risk for falls); 46-51 (>50% risk for falls); 52-55 (lower risk <25% of falls)  FOTO 44 56 goal                         PT Education - 08/19/21 1501     Education Details POC    Person(s) Educated Patient    Methods Explanation    Comprehension Verbalized understanding              PT Short Term Goals - 08/19/21 1700       PT SHORT TERM GOAL #1   Title Patient will be independent in home exercise program to improve strength/mobility for better functional independence with ADLs.    Baseline No HEP    Time 4    Period Weeks    Status New    Target Date 09/16/21               PT Long Term Goals - 08/19/21 1701       PT LONG TERM GOAL #1   Title Patient will increase FOTO score to equal to or greater than 52    to demonstrate statistically significant improvement in mobility and quality of life.    Baseline 44 on 1/5    Time 12    Period Weeks    Status New    Target Date 11/11/21      PT LONG TERM GOAL #2   Title Patient (> 73 years old) will complete five times sit to stand test in < 15 seconds indicating an increased LE strength and improved balance.    Baseline 34.35 with B UE    Time 12    Period Weeks    Status New    Target Date  11/12/21      PT LONG TERM GOAL #3   Title Patient will increase Berg Balance score by > 6 points to demonstrate decreased fall risk during functional activities.    Baseline 42 on 08/20/21    Time 12    Period Weeks    Status New    Target Date 11/12/21      PT LONG TERM GOAL #4   Title Patient will increase 10 meter walk test with LRAD to >1.3070m/s as to improve gait speed for better community ambulation and to reduce fall risk.    Baseline .71 with RW on 08/20/21    Time 12  Period Weeks    Status New    Target Date 11/12/21      PT LONG TERM GOAL #5   Title Patient will reduce timed up and go to <15 seconds with LRAD to reduce fall risk and demonstrate improved transfer/gait ability.    Baseline 23.95 sec with RW on 08/20/21    Time 12    Period Weeks    Status New    Target Date 11/12/21                   Plan - 08/19/21 1519     Clinical Impression Statement Pt is 71 y.o. male who presents to therapy following a CVA on 05/21/2021. Pt presents with deficits in his functional strength, balance and is at increased risk of falls based on his scores for several functional tests including TUG, 5XSTS, and BERG balance test. Pt has activity restrictions in his ability to perform activities around his home as well as participation restrictions in his ability to garden and perform yard work activities. Pt will significantly benefit from akilled PT intervention in order to improve his LE strength, balance, reduce his risk of falls, increase his mobility and improve his quality of life.    Personal Factors and Comorbidities Age;Comorbidity 1;Comorbidity 2;Comorbidity 3+    Comorbidities T2 DM, neuropathy, CAD, HTN, hyperlipedemia    Examination-Activity Limitations Carry;Locomotion Level;Squat;Stairs;Stand;Transfers    Examination-Participation Restrictions Community Activity;Yard Work    Conservation officer, historic buildings Evolving/Moderate complexity    Clinical Decision Making  Moderate    Rehab Potential Good    PT Frequency 2x / week    PT Duration 12 weeks    PT Treatment/Interventions ADLs/Self Care Home Management;Electrical Stimulation;Gait training;DME Instruction;Stair training;Functional mobility training;Therapeutic activities;Therapeutic exercise;Balance training;Neuromuscular re-education;Patient/family education;Passive range of motion;Energy conservation;Joint Manipulations    PT Next Visit Plan HEP    PT Home Exercise Plan HEP    Recommended Other Services Already scheduled or in OT and Speech    Consulted and Agree with Plan of Care Patient             Patient will benefit from skilled therapeutic intervention in order to improve the following deficits and impairments:  Abnormal gait, Decreased activity tolerance, Decreased endurance, Decreased strength, Decreased balance, Decreased mobility, Difficulty walking, Impaired tone  Visit Diagnosis: Abnormality of gait and mobility  Difficulty in walking, not elsewhere classified  Other abnormalities of gait and mobility  Unsteadiness on feet     Problem List Patient Active Problem List   Diagnosis Date Noted   Noncompliance with medication treatment due to intermittent use of medication 11/24/2015   Gastritis 11/24/2015   Allergic rhinitis 09/03/2015   GERD (gastroesophageal reflux disease) 09/03/2015   Other vitamin B12 deficiency anemia 09/10/2012   Diabetic polyneuropathy (HCC) 09/10/2012   Radiculopathy of lumbar region 07/24/2012   History of pulmonary embolism 07/24/2012   Hypertension associated with diabetes (HCC) 04/30/2012   Vasomotor flushing 04/30/2012   Hyperlipidemia with target LDL less than 70 11/16/2011   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

## 2021-08-23 ENCOUNTER — Other Ambulatory Visit: Payer: Self-pay

## 2021-08-23 ENCOUNTER — Ambulatory Visit: Payer: Medicare Other | Admitting: Physical Therapy

## 2021-08-23 ENCOUNTER — Ambulatory Visit: Payer: Medicare Other | Admitting: Occupational Therapy

## 2021-08-23 DIAGNOSIS — R2681 Unsteadiness on feet: Secondary | ICD-10-CM

## 2021-08-23 DIAGNOSIS — R269 Unspecified abnormalities of gait and mobility: Secondary | ICD-10-CM

## 2021-08-23 DIAGNOSIS — M6281 Muscle weakness (generalized): Secondary | ICD-10-CM | POA: Diagnosis not present

## 2021-08-23 DIAGNOSIS — R2689 Other abnormalities of gait and mobility: Secondary | ICD-10-CM

## 2021-08-23 DIAGNOSIS — R278 Other lack of coordination: Secondary | ICD-10-CM

## 2021-08-23 DIAGNOSIS — R262 Difficulty in walking, not elsewhere classified: Secondary | ICD-10-CM

## 2021-08-23 NOTE — Therapy (Signed)
[place] MAIN [facility] SERVICES [address] [place], [place], [address] Phone: [phone]   Fax:  [phone]  Occupational Therapy Progress Note  Dates of reporting period  07/06/2021   to   08/23/2021   Patient Details  Name: Johnathan Hernandez MRN: [id number] Date of Birth: 08/06/1951 Referring Provider (OT): Cheri Kearns   Encounter Date: 08/23/2021   OT End of Session - 08/23/21 1611     Visit Number 10    Number of Visits 24    Date for OT Re-Evaluation 09/28/21    Authorization Time Period Progress reporting period starting 07/06/2021    OT Start Time 1520    OT Stop Time 1600    OT Time Calculation (min) 40 min    Activity Tolerance Patient tolerated treatment well    Behavior During Therapy [facility] for tasks assessed/performed             Past Medical History:  Diagnosis Date   Coronary artery disease    Diabetes mellitus    History of tobacco abuse    quit in 2006 after 5 vessel cabg   Hyperlipidemia    Hypertension    Peripheral vascular disease [facility]) Dec 2012   [facility], treated with extensive arterial bypass    Past Surgical History:  Procedure Laterality Date   AORTA - ILIAC ARTERY BYPASS GRAFT  dec 2012   [facility]   CORONARY ARTERY BYPASS GRAFT  2006   [place]    There were no vitals filed for this visit.   Subjective Assessment - 08/23/21 1630     Subjective  Pt. reports  that he didint realize he had an appointment today.    Patient is accompanied by: Family member    Pertinent History Pt. is a 71 y.o. male who was diagnosed with a CVA with right sided weakness. Pt. was admitted to [facility], and received inpatient rehab services. Pt.'s PMHx includes: DM Type II, Diabetic peripheral neuropathy, CAD, HTN, Hyperlipidemia, VTE. Pt. enjoys working out in his yard, and fishing.    Currently in Pain? Yes    Pain Score 3     Pain Location Shoulder    Pain Orientation Right    Pain Descriptors / Indicators Aching;Tightness     Pain Type Chronic pain    Pain Onset More than a month ago    Pain Score 3    Pain Location Hand    Pain Orientation Right    Pain Descriptors / Indicators Aching    Pain Type Chronic pain                [facility] OT Assessment - 08/23/21 1538       Coordination   Right 9 Hole Peg Test 37 sec.      AROM   Overall AROM Comments Right shoulder flexion: 125(150), Abduction: 100(110), elbow extension -7(-7) elbow flexion 140(140), Digit flexion to [facility] 2nd: 2 cm, 3rd 1.5 cm      Strength   Overall Strength Comments Right shoulder flexion3-/,, abduction 3/5,  elbow flexion, extension 4-/5, wrist extension 4-/5      Hand Function   Right Hand Grip (lbs) 76    Right Hand Lateral Pinch 21 lbs    Right Hand 3 Point Pinch 15 lbs    Left Hand Grip (lbs) 65            Measuremenst were obtained, and goals were reviewed with the pt. Pt. has made progress with RUE  ROM, strength, grip strength, pinch strength, and [place] skills. Pt. Is now using his right hand more during tasks at home. Pt. Is now able to prepare meals using the stovetop, and oven. Pt. is able to make his bed. Pt. has improved with self-feeding using an adapted utensil. Pt.'s FOTO score is 59. Pt. continues to present with pain in the right shoulder, right lateral rib, back pain, and a cold tingling pain in the right hand, right side of face, and jaw. Pt. continues to work on improving RUE functioning in order to work towards improving engagement during ADLs, and IADL tasks.                     OT Short Term Goals - 08/23/21 1549       OT SHORT TERM GOAL #1   Title Pt. will demonstrate independence with a HEP for the RUE.    Baseline Independent with Theraputty exercises. Eval: No current HEP program.    Time 6    Period Weeks    Status Partially Met    Target Date 10/04/21               OT Long Term Goals - 08/23/21 1550       OT LONG TERM GOAL #1   Title Pt. will increase his FOTO score by 2  pts. for clinically significant ADL related change.    Baseline 10th visit: FOTO: 59 Eval: FOTO score: 58, TR: 70    Time 12    Period Weeks    Status New    Target Date 09/28/21      OT LONG TERM GOAL #2   Title Pt. will improve right shoulder flexion by 10 degrees to be able to reach up to access cabinetry, and shelves.    Baseline 10th visit: 125(150) Eval: Shoulder flexion: 111(127)    Time 12    Period Weeks    Status On-going    Target Date 09/28/21      OT LONG TERM GOAL #3   Title Pt. will improve right hand [facility] skills by 5 sec. of speed to assist with buttoning.    Baseline 10th visit: 37 sec. Eval: R: 42 sec., L: 27 sec.    Time 12    Period Weeks    Status On-going    Target Date 09/28/21      OT LONG TERM GOAL #4   Title Pt. will perform light meal prep with modified independence using using work simplification strategies as needed    Baseline Pt. prepared dinner 2 days using the oven, and stovetop using the counter for support seconadry to back pain. Eval: Pt. is unable    Time 12    Period Weeks    Status On-going    Target Date 09/28/21      OT LONG TERM GOAL #5   Title Pt. will independently be able to perfrom self-feeding using his right hand    Baseline 10th visit: Modified independent with an adaptive utensil. with occasionally dropping food. Eval: Pt. has difficulty performing self-feeding with his right hand    Time 12    Period Weeks    Target Date 09/28/21      OT LONG TERM GOAL #6   Title Pt. will perform light homemaking tasks with Modified Independence.    Baseline 10th visit:  independent making the bed.Eval: Pt. is not performing home management tasks.    Time 12    Status On-going  Target Date 09/28/21                   Plan - 08/23/21 1612     Clinical Impression Statement Measuremenst were obtained, and goals were reviewed with the pt. Pt. has made progress with RUE ROM, strength, grip strength, pinch strength, and [place] skills.  Pt. Is now using his right hand more during tasks at home. Pt. Is now able to prepare meals using the stovetop, and oven. Pt. is able to make his bed. Pt. has improved with self-feeding using an adapted utensil. Pt.'s FOTO score is 59. Pt. continues to present with pain in the right shoulder, right lateral rib, back pain, and a cold tingling pain in the right hand, right side of face, and jaw. Pt. continues to work on improving RUE functioning in order to work towards improving engagement during ADLs, and IADL tasks.       OT Occupational Profile and History Detailed Assessment- Review of Records and additional review of physical, cognitive, psychosocial history related to current functional performance    Occupational performance deficits (Please refer to evaluation for details): IADL's;ADL's;Leisure    Rehab Potential Good    Clinical Decision Making Several treatment options, min-mod task modification necessary    Comorbidities Affecting Occupational Performance: [place] have comorbidities impacting occupational performance    Modification or Assistance to Complete Evaluation  Min-Moderate modification of tasks or assist with assess necessary to complete eval    OT Frequency 2x / week    OT Duration 12 weeks    OT Treatment/Interventions Self-care/ADL training;Therapeutic exercise;Neuromuscular education;Electrical Stimulation;Moist Heat;Passive range of motion;Therapeutic activities;Patient/family education;DME and/or AE instruction;Functional Mobility Training;Energy conservation;Manual Therapy    Consulted and Agree with Plan of Care Patient             Patient will benefit from skilled therapeutic intervention in order to improve the following deficits and impairments:           Visit Diagnosis: Muscle weakness (generalized)  Other lack of coordination    Problem List Patient Active Problem List   Diagnosis Date Noted   Noncompliance with medication treatment due to  intermittent use of medication 11/24/2015   Gastritis 11/24/2015   Allergic rhinitis 09/03/2015   GERD (gastroesophageal reflux disease) 09/03/2015   Other vitamin B12 deficiency anemia 09/10/2012   Diabetic polyneuropathy ([place]) 09/10/2012   Radiculopathy of lumbar region 07/24/2012   History of pulmonary embolism 07/24/2012   Hypertension associated with diabetes ([place]) 04/30/2012   Vasomotor flushing 04/30/2012   Hyperlipidemia with target LDL less than 70 11/16/2011   Diabetes mellitus with atherosclerosis of arteries of extremities ([place]) 11/14/2011   Peripheral vascular disease ([place])    History of tobacco abuse    Coronary artery disease     Harrel Carina, MS, OTR/L 08/23/2021, 4:31 PM  [place], [place], [address] Phone: [phone]   Fax:  [phone]  Name: Johnathan Hernandez MRN: [id number] Date of Birth: 15-Mar-1951

## 2021-08-23 NOTE — Patient Instructions (Addendum)
Access Code: [email or web address] URL: [email or web address] Date: 08/23/2021 Prepared by: Thresa Ross  Exercises Sit to Stand with Armchair - 1 x daily - 7 x weekly - 2 sets - 10 reps Standing March with Counter Support - 1 x daily - 7 x weekly - 2 sets - 10 reps Standing Heel Raise with Support - 1 x daily - 7 x weekly - 3 sets - 10 reps Mini Squat with Counter Support - 1 x daily - 7 x weekly - 2 sets - 10 reps

## 2021-08-23 NOTE — Therapy (Signed)
[place] MAIN [facility] SERVICES [address] [place], [place], [address] Phone: [phone]   Fax:  [phone]  Physical Therapy Treatment  Patient Details  Name: Johnathan Hernandez MRN: [id number] Date of Birth: August 23, 1950 No data recorded  Encounter Date: 08/23/2021   PT End of Session - 08/23/21 1453     Visit Number 2    Number of Visits 24    Date for PT Re-Evaluation 11/11/21    PT Start Time [id number]    PT Stop Time 1510    PT Time Calculation (min) 45 min    Equipment Utilized During Treatment Gait belt    Activity Tolerance Patient tolerated treatment well             Past Medical History:  Diagnosis Date   Coronary artery disease    Diabetes mellitus    History of tobacco abuse    quit in 2006 after 5 vessel cabg   Hyperlipidemia    Hypertension    Peripheral vascular disease [facility]) Dec 2012   [facility], treated with extensive arterial bypass    Past Surgical History:  Procedure Laterality Date   AORTA - ILIAC ARTERY BYPASS GRAFT  dec 2012   [facility]   CORONARY ARTERY BYPASS GRAFT  2006   [place]    There were no vitals filed for this visit.   Subjective Assessment - 08/23/21 1611     Subjective Pt reports no significant changes since previous session. NO falls or LOB. Still get tired quickly.    Patient is accompained by: Family member    Pertinent History Pt reports he had a stroke on May 21, 2021. Pt reports he went to hospital multiple times prior to his stroke. Pt reports numbness and pain  in his foot, and in his R hand, right arm and in the right side of his face. Pt report increased pain and discomfort in his right side when it gets cold outside. Pt reports he used to perform yard work such as mowing, getting up leaves, gardening and other outdoor activities. Pt reports his DTR is staying with him now and helping out as needed. reports he would like to be independent where she does not have to stay with him.  Pt reports  one fall when he was entering his house and his leg gave out (R) and her reports losing his balance once in his bathroom (second fall) and one stumble but was able to catch himself to prevent him from falling in the past several months. Relevant history of T2 DM, neuropathy, CAD, HTN and hyperlipedemia.    Limitations Sitting    How long can you sit comfortably? n/a    How long can you stand comfortably? 5 min    How long can you walk comfortably? 50 feet in house to bathroom, to car for appointments    Patient Stated Goals Balance, strenght, mobility, prevent falls    Currently in Pain? No/denies    Pain Onset More than a month ago            Exercises - There ACT and TherEX - to improve transitions as well as functional squatting.  Sit to Stand with Armchair - 1 x daily - 7 x weekly - 2 sets - 10 reps Standing March with Counter Support - 1 x daily - 7 x weekly - 2 sets - 10 reps Standing Heel Raise with Support - 1 x daily - 7 x weekly - 3  sets - 10 reps Mini Squat with Counter Support - 1 x daily - 7 x weekly - 2 sets - 10 reps   Exercise/Activity Sets/Reps/Time/ Resistance Assistance Charge type Comments  Hamstring curls  2 x 10 RTB   Therex  To improve LE strength   Ambulation with RW 75 feet CGA. RW TherACt To improve pattern of goat, cues for proper heel strike.                                                          Exercise/Activity Sets/ Reps/Time/ Resistance Assistance Charge type Comments  Airex self selected stance  2 x 45 sec  CGA                            SLS progression  3 x 30 sec ea   Neuro re-ed   Adducted stance - airex pad  2 x 45 sec  CGA  Neuro re- ed  To improve static balance on unsteady surfaces                           Treatment provided this session   Pt educated throughout session about proper posture and technique with exercises. Improved exercise technique, movement at target joints, use of target muscles after min to mod verbal,  visual, tactile cues. Note: Portions of this document were prepared using Dragon voice recognition software and although reviewed [place] contain unintentional dictation errors in syntax, grammar, or spelling.                              PT Short Term Goals - 08/19/21 1700       PT SHORT TERM GOAL #1   Title Patient will be independent in home exercise program to improve strength/mobility for better functional independence with ADLs.    Baseline No HEP    Time 4    Period Weeks    Status New    Target Date 09/16/21               PT Long Term Goals - 08/19/21 1701       PT LONG TERM GOAL #1   Title Patient will increase FOTO score to equal to or greater than 52    to demonstrate statistically significant improvement in mobility and quality of life.    Baseline 44 on 1/5    Time 12    Period Weeks    Status New    Target Date 11/11/21      PT LONG TERM GOAL #2   Title Patient (> 71 years old) will complete five times sit to stand test in < 15 seconds indicating an increased LE strength and improved balance.    Baseline 34.35 with B UE    Time 12    Period Weeks    Status New    Target Date 11/12/21      PT LONG TERM GOAL #3   Title Patient will increase Berg Balance score by > 6 points to demonstrate decreased fall risk during functional activities.    Baseline 42 on 08/20/21    Time 12    Period Weeks    Status  New    Target Date 11/12/21      PT LONG TERM GOAL #4   Title Patient will increase 10 meter walk test with LRAD to >1.49m/s as to improve gait speed for better community ambulation and to reduce fall risk.    Baseline .53 with RW on 08/20/21    Time 12    Period Weeks    Status New    Target Date 11/12/21      PT LONG TERM GOAL #5   Title Patient will reduce timed up and go to <15 seconds with LRAD to reduce fall risk and demonstrate improved transfer/gait ability.    Baseline 23.95 sec with RW on 08/20/21    Time 12    Period Weeks     Status New    Target Date 11/12/21                   Plan - 08/23/21 1457     Clinical Impression Statement Pt presents with excellent motivation for completion of physical therapy program.  Patient began with initial home exercise program involving primarily functional transfer training as well as standing strength and endurance related activities.  Patient tolerated well but did require frequent wet rest breaks throughout due to fatigue.  Patient also to begin initial balance and neuromuscular reeducation type interventions in today's session patient tolerated these well overall we will continue to progress these in future sessions.  Patient will continue to benefit from skilled physical therapy interventions in order to improve his lower extremity strength, balance, ambulatory capacity, and overall quality of life.    Personal Factors and Comorbidities Age;Comorbidity 1;Comorbidity 2;Comorbidity 3+    Comorbidities T2 DM, neuropathy, CAD, HTN, hyperlipedemia    Examination-Activity Limitations Carry;Locomotion Level;Squat;Stairs;Stand;Transfers    Examination-Participation Restrictions Community Activity;Yard Work    [employer] Evolving/Moderate complexity    Rehab Potential Good    PT Frequency 2x / week    PT Duration 12 weeks    PT Treatment/Interventions ADLs/Self Care Home Management;Electrical Stimulation;Gait training;DME Instruction;Stair training;Functional mobility training;Therapeutic activities;Therapeutic exercise;Balance training;Neuromuscular re-education;Patient/family education;Passive range of motion;Energy conservation;Joint Manipulations    PT Next Visit Plan Balance, enduraqnce, strength, nustep for aerobic priming and endurance    PT Home Exercise Plan HEP    Consulted and Agree with Plan of Care Patient             Patient will benefit from skilled therapeutic intervention in order to improve the following deficits and  impairments:  Abnormal gait, Decreased activity tolerance, Decreased endurance, Decreased strength, Decreased balance, Decreased mobility, Difficulty walking, Impaired tone  Visit Diagnosis: Abnormality of gait and mobility  Difficulty in walking, not elsewhere classified  Other abnormalities of gait and mobility  Unsteadiness on feet     Problem List Patient Active Problem List   Diagnosis Date Noted   Noncompliance with medication treatment due to intermittent use of medication 11/24/2015   Gastritis 11/24/2015   Allergic rhinitis 09/03/2015   GERD (gastroesophageal reflux disease) 09/03/2015   Other vitamin B12 deficiency anemia 09/10/2012   Diabetic polyneuropathy ([place]) 09/10/2012   Radiculopathy of lumbar region 07/24/2012   History of pulmonary embolism 07/24/2012   Hypertension associated with diabetes ([place]) 04/30/2012   Vasomotor flushing 04/30/2012   Hyperlipidemia with target LDL less than 70 11/16/2011   Diabetes mellitus with atherosclerosis of arteries of extremities ([place]) 11/14/2011   Peripheral vascular disease ([place])    History of tobacco abuse    Coronary artery disease  Particia Lather, PT 08/23/2021, 4:16 PM  [place] MAIN [facility] SERVICES [address] [place], [place], [address] Phone: [phone]   Fax:  [phone]  Name: Johnathan Hernandez MRN: [id number] Date of Birth: 07-27-1951

## 2021-08-25 ENCOUNTER — Ambulatory Visit: Payer: Medicare Other | Admitting: Physical Therapy

## 2021-08-25 ENCOUNTER — Other Ambulatory Visit: Payer: Self-pay

## 2021-08-25 ENCOUNTER — Ambulatory Visit: Payer: Medicare Other | Admitting: Occupational Therapy

## 2021-08-25 VITALS — BP 123/74

## 2021-08-25 DIAGNOSIS — M6281 Muscle weakness (generalized): Secondary | ICD-10-CM

## 2021-08-25 DIAGNOSIS — R2681 Unsteadiness on feet: Secondary | ICD-10-CM

## 2021-08-25 DIAGNOSIS — R2689 Other abnormalities of gait and mobility: Secondary | ICD-10-CM

## 2021-08-25 DIAGNOSIS — R262 Difficulty in walking, not elsewhere classified: Secondary | ICD-10-CM

## 2021-08-25 DIAGNOSIS — R269 Unspecified abnormalities of gait and mobility: Secondary | ICD-10-CM

## 2021-08-25 NOTE — Therapy (Signed)
[facility] MAIN [facility] SERVICES [address] [place], [place], [address] Phone: [phone]   Fax:  [phone]  Physical Therapy Treatment  Patient Details  Name: Johnathan Hernandez MRN: [id number] Date of Birth: [birth year] No data recorded  Encounter Date: 08/25/2021   PT End of Session - 08/25/21 1719     Visit Number 3    Number of Visits 24    Date for PT Re-Evaluation 11/11/21    PT Start Time 1434    PT Stop Time 1515    PT Time Calculation (min) 41 min    Equipment Utilized During Treatment Gait belt    Activity Tolerance Patient tolerated treatment well             Past Medical History:  Diagnosis Date   Coronary artery disease    Diabetes mellitus    History of tobacco abuse    quit in 2006 after 5 vessel cabg   Hyperlipidemia    Hypertension    Peripheral vascular disease [facility]) Dec 2012   [facility], treated with extensive arterial bypass    Past Surgical History:  Procedure Laterality Date   AORTA - ILIAC ARTERY BYPASS GRAFT  dec 2012   [facility]   CORONARY ARTERY BYPASS GRAFT  2006   [place]    Vitals:   08/25/21 1442  BP: 123/74     Subjective Assessment - 08/25/21 1440     Subjective Pt reports no significant changes since previous session. Pt reports his DTR reported some facial drooping but he was not able to tell any facial drooping. Pt also reports some high BP last night. Checked in clinic and BP was normal and pt assessed for facial drooping and pt appeared to have all facial muscles funcitoning reciprically.    Patient is accompained by: Family member    Pertinent History Pt reports he had a stroke on May 21, 2021. Pt reports he went to hospital multiple times prior to his stroke. Pt reports numbness and pain  in his foot, and in his R hand, right arm and in the right side of his face. Pt report increased pain and discomfort in his right side when it gets cold outside. Pt reports he used to perform yard work such  as mowing, getting up leaves, gardening and other outdoor activities. Pt reports his DTR is staying with him now and helping out as needed. reports he would like to be independent where she does not have to stay with him.  Pt reports one fall when he was entering his house and his leg gave out (R) and her reports losing his balance once in his bathroom (second fall) and one stumble but was able to catch himself to prevent him from falling in the past several months. Relevant history of T2 DM, neuropathy, CAD, HTN and hyperlipedemia.    Limitations Sitting    How long can you sit comfortably? n/a    How long can you stand comfortably? 5 min    How long can you walk comfortably? 50 feet in house to bathroom, to car for appointments    Patient Stated Goals Balance, strenght, mobility, prevent falls    Pain Onset More than a month ago             Exercise/Activity Sets/Reps/Time/ Resistance Assistance Charge type Comments  Hamstring curls  2 x 10 RTB   Therex  To improve LE strength   LAQ  X 10 3# AW  Therex    Nustep for aerobic priming  1 min: 1 min  Level 0: level 3 interval training   Therex  Pt rated 6 RPE   STS- 2 in step under L LE  X 5  CGA  Neuro RE ed  To improve weightbearing and focus on R LE when transitioning to standing    Ambulation in // bars  X 4 laps  CGA  TherACT Unilateral UE assist                                       Exercise/Activity Sets/ Reps/Time/ Resistance Assistance Charge type Comments                                SLS progression - LE airex 1 LE on step  3 x 30 sec ea   Neuro re-ed Pt reports ankle feels like it wants to turn over with R LE on airex. No LOB  Adducted stance - airex pad  2 x 45 sec  CGA  Neuro re- ed  To improve static balance on unsteady surfaces  Added head turns for set 2 and 3, increased sway wit head turns and intermittennt LOB corrected by CGA                           Treatment provided this session   Pt educated  throughout session about proper posture and technique with exercises. Improved exercise technique, movement at target joints, use of target muscles after min to mod verbal, visual, tactile cues. Note: Portions of this document were prepared using Dragon voice recognition software and although reviewed Johnathan Hernandez contain unintentional dictation errors in syntax, grammar, or spelling.                            PT Education - 08/25/21 1444     Education Details Impact of aerobic priming    Person(s) Educated Patient    Methods Explanation    Comprehension Verbalized understanding              PT Short Term Goals - 08/19/21 1700       PT SHORT TERM GOAL #1   Title Patient will be independent in home exercise program to improve strength/mobility for better functional independence with ADLs.    Baseline No HEP    Time 4    Period Weeks    Status New    Target Date 09/16/21               PT Long Term Goals - 08/19/21 1701       PT LONG TERM GOAL #1   Title Patient will increase FOTO score to equal to or greater than 52    to demonstrate statistically significant improvement in mobility and quality of life.    Baseline 44 on 1/5    Time 12    Period Weeks    Status New    Target Date 11/11/21      PT LONG TERM GOAL #2   Title Patient (> 73 years old) will complete five times sit to stand test in < 15 seconds indicating an increased LE strength and improved balance.    Baseline 34.35 with B UE    Time 12  Period Weeks    Status New    Target Date 11/12/21      PT LONG TERM GOAL #3   Title Patient will increase Berg Balance score by > 6 points to demonstrate decreased fall risk during functional activities.    Baseline 42 on 08/20/21    Time 12    Period Weeks    Status New    Target Date 11/12/21      PT LONG TERM GOAL #4   Title Patient will increase 10 meter walk test with LRAD to >1.78m/s as to improve gait speed for better community  ambulation and to reduce fall risk.    Baseline .71 with RW on 08/20/21    Time 12    Period Weeks    Status New    Target Date 11/12/21      PT LONG TERM GOAL #5   Title Patient will reduce timed up and go to <15 seconds with LRAD to reduce fall risk and demonstrate improved transfer/gait ability.    Baseline 23.95 sec with RW on 08/20/21    Time 12    Period Weeks    Status New    Target Date 11/12/21                   Plan - 08/25/21 1720     Clinical Impression Statement Patient presents with excellent motivation for completion of physical therapy training session.  Began with aerobic priming in order to improve patient's ability to learn various tasks within session.  Progressed with various activities involving increasing weightbearing on the right lower extremity as well as increasing strength in the right lower extremity.  Also progressed with several balance related activities in order to improve the balance as well as improve his ability to ambulate with less need for assistance.  Patient will continue to benefit from skilled physical therapy interventions in order to improve his lower extremity strength, balance, ambulatory capacity, and overall quality of life.    Personal Factors and Comorbidities Age;Comorbidity 1;Comorbidity 2;Comorbidity 3+    Comorbidities T2 DM, neuropathy, CAD, HTN, hyperlipedemia    Examination-Activity Limitations Carry;Locomotion Level;Squat;Stairs;Stand;Transfers    Examination-Participation Restrictions Community Activity;Yard Work    Conservation officer, historic buildings Evolving/Moderate complexity    Rehab Potential Good    PT Frequency 2x / week    PT Duration 12 weeks    PT Treatment/Interventions ADLs/Self Care Home Management;Electrical Stimulation;Gait training;DME Instruction;Stair training;Functional mobility training;Therapeutic activities;Therapeutic exercise;Balance training;Neuromuscular re-education;Patient/family education;Passive  range of motion;Energy conservation;Joint Manipulations    PT Next Visit Plan Balance, enduraqnce, strength, nustep for aerobic priming and endurance    PT Home Exercise Plan HEP    Consulted and Agree with Plan of Care Patient             Patient will benefit from skilled therapeutic intervention in order to improve the following deficits and impairments:  Abnormal gait, Decreased activity tolerance, Decreased endurance, Decreased strength, Decreased balance, Decreased mobility, Difficulty walking, Impaired tone  Visit Diagnosis: Abnormality of gait and mobility  Difficulty in walking, not elsewhere classified  Other abnormalities of gait and mobility  Unsteadiness on feet     Problem List Patient Active Problem List   Diagnosis Date Noted   Noncompliance with medication treatment due to intermittent use of medication 11/24/2015   Gastritis 11/24/2015   Allergic rhinitis 09/03/2015   GERD (gastroesophageal reflux disease) 09/03/2015   Other vitamin B12 deficiency anemia 09/10/2012   Diabetic polyneuropathy (HCC) 09/10/2012   Radiculopathy  of lumbar region 07/24/2012   History of pulmonary embolism 07/24/2012   Hypertension associated with diabetes (HCC) 04/30/2012   Vasomotor flushing 04/30/2012   Hyperlipidemia with target LDL less than 70 11/16/2011   Diabetes mellitus with atherosclerosis of arteries of extremities (HCC) 11/14/2011   Peripheral vascular disease (HCC)    History of tobacco abuse    Coronary artery disease     Johnathan Hernandez, PT 08/25/2021, 5:23 PM  [place] [facility] MAIN [facility] [address] [place], [place], [address] Phone: [phone]   Fax:  [phone]  Name: Johnathan Hernandez MRN: [id number] Date of Birth: 01/02/1951

## 2021-08-26 ENCOUNTER — Encounter: Payer: Self-pay | Admitting: Occupational Therapy

## 2021-08-26 NOTE — Therapy (Addendum)
[place] MAIN [facility] SERVICES [address] [place], [place], [address] Phone: [phone]   Fax:  [phone]  Occupational Therapy Treatment  Patient Details  Name: Johnathan Hernandez MRN: [id number] Date of Birth: 1951/04/04 Referring Provider (OT): Cheri Kearns   Encounter Date: 08/25/2021   OT End of Session - 08/26/21 0903     Visit Number 11    Number of Visits 24    Date for OT Re-Evaluation 09/28/21    Authorization Time Period Progress reporting period starting 07/06/2021    OT Start Time 1518    OT Stop Time 1600    OT Time Calculation (min) 42 min    Activity Tolerance Patient tolerated treatment well    Behavior During Therapy [facility] for tasks assessed/performed             Past Medical History:  Diagnosis Date   Coronary artery disease    Diabetes mellitus    History of tobacco abuse    quit in 2006 after 5 vessel cabg   Hyperlipidemia    Hypertension    Peripheral vascular disease [facility]) Dec 2012   [facility], treated with extensive arterial bypass    Past Surgical History:  Procedure Laterality Date   AORTA - ILIAC ARTERY BYPASS GRAFT  dec 2012   [facility]   CORONARY ARTERY BYPASS GRAFT  2006   [place]    There were no vitals filed for this visit.   Subjective Assessment - 08/26/21 0902     Subjective  Pt. reports working alot with PT today.    Patient is accompanied by: Family member    Pertinent History Pt. is a 71 y.o. male who was diagnosed with a CVA with right sided weakness. Pt. was admitted to [facility], and received inpatient rehab services. Pt.'s PMHx includes: DM Type II, Diabetic peripheral neuropathy, CAD, HTN, Hyperlipidemia, VTE. Pt. enjoys working out in his yard, and fishing.    Currently in Pain? Yes            Therapeutic Ex.   Pt. performed AROM followed by PROM for right shoulder flexion, abduction, horizontal adduction, and abduction, forearm supination following moist heat.  Pt. performed 1.5#  dowel ex. For shoulder flexion, chest press, and circular motion. Pt. perfromed 2# dumbbell ex. for elbow flexion and extension,  2# for forearm supination/pronation, wrist flexion/extension, and radial deviation. Pt. requires rest breaks and verbal cues for proper technique. For UE strengthening secondary to weakness. Pt. worked on [employer], and reciprocal motion using the UBE while seated for 8 min. with minimal resistance. Constant monitoring was provided.    Manual Therapy:   Pt. tolerated gentle scapular glides in elevation, depression, abduct./rotation following moist heat modality. Pt. tolerated gentle soft tissue massage to the musculature of the scapular region secondary increased tone, and tightness. Manual therapy was performed independent of, and in preparation for ROM.   Pt. reports right shoulder pain. Pt. presents with limited RUE ROM, strength, motor control, and [facility] skills. Pt. Tolerated the manual therapy, and exercises well. Pt. continues to require cues to avoid compensation proximally. Pt. required cues, and visual demonstration for proper technique.  Pt. responded well to there. Ex., manual therapy, and coordination. Pt. continues to work towards improving right shoulder ROM, strength, motor control, and [facility] skills in order to improve RUE functioning in preparation for ADLs, and IADL tasks.  OT Education - 08/26/21 0903     Education Details ROM, strength, grip strength    Person(s) Educated Patient    Methods Explanation    Comprehension Verbalized understanding              OT Short Term Goals - 08/23/21 1549       OT SHORT TERM GOAL #1   Title Pt. will demonstrate independence with a HEP for the RUE.    Baseline Independent with Theraputty exercises. Eval: No current HEP program.    Time 6    Period Weeks    Status Partially Met    Target Date 10/04/21               OT Long Term Goals -  08/23/21 1550       OT LONG TERM GOAL #1   Title Pt. will increase his FOTO score by 2 pts. for clinically significant ADL related change.    Baseline 10th visit: FOTO: 59 Eval: FOTO score: 58, TR: 70    Time 12    Period Weeks    Status New    Target Date 09/28/21      OT LONG TERM GOAL #2   Title Pt. will improve right shoulder flexion by 10 degrees to be able to reach up to access cabinetry, and shelves.    Baseline 10th visit: 125(150) Eval: Shoulder flexion: 111(127)    Time 12    Period Weeks    Status On-going    Target Date 09/28/21      OT LONG TERM GOAL #3   Title Pt. will improve right hand [facility] skills by 5 sec. of speed to assist with buttoning.    Baseline 10th visit: 37 sec. Eval: R: 42 sec., L: 27 sec.    Time 12    Period Weeks    Status On-going    Target Date 09/28/21      OT LONG TERM GOAL #4   Title Pt. will perform light meal prep with modified independence using using work simplification strategies as needed    Baseline Pt. prepared dinner 2 days using the oven, and stovetop using the counter for support seconadry to back pain. Eval: Pt. is unable    Time 12    Period Weeks    Status On-going    Target Date 09/28/21      OT LONG TERM GOAL #5   Title Pt. will independently be able to perfrom self-feeding using his right hand    Baseline 10th visit: Modified independent with an adaptive utensil. with occasionally dropping food. Eval: Pt. has difficulty performing self-feeding with his right hand    Time 12    Period Weeks    Target Date 09/28/21      OT LONG TERM GOAL #6   Title Pt. will perform light homemaking tasks with Modified Independence.    Baseline 10th visit:  independent making the bed.Eval: Pt. is not performing home management tasks.    Time 12    Status On-going    Target Date 09/28/21                   Plan - 08/26/21 0904     Clinical Impression Statement Pt. reports right shoulder pain. Pt. presents with limited RUE  ROM, strength, motor control, and [facility] skills. Pt. Tolerated the manual therapy, and exercises well. Pt. continues to require cues to avoid compensation proximally. Pt. required cues, and visual demonstration for proper technique.  Pt. responded well to there. Ex., manual therapy, and coordination. Pt. continues to work towards improving right shoulder ROM, strength, motor control, and [facility] skills in order to improve RUE functioning in preparation for ADLs, and IADL tasks.    OT Occupational Profile and History Detailed Assessment- Review of Records and additional review of physical, cognitive, psychosocial history related to current functional performance    Occupational performance deficits (Please refer to evaluation for details): IADL's;ADL's;Leisure    Rehab Potential Good    Clinical Decision Making Several treatment options, min-mod task modification necessary    Comorbidities Affecting Occupational Performance: [place] have comorbidities impacting occupational performance    Modification or Assistance to Complete Evaluation  Min-Moderate modification of tasks or assist with assess necessary to complete eval    OT Frequency 2x / week    OT Duration 12 weeks    OT Treatment/Interventions Self-care/ADL training;Therapeutic exercise;Neuromuscular education;Electrical Stimulation;Moist Heat;Passive range of motion;Therapeutic activities;Patient/family education;DME and/or AE instruction;Functional Mobility Training;Energy conservation;Manual Therapy    Consulted and Agree with Plan of Care Patient             Patient will benefit from skilled therapeutic intervention in order to improve the following deficits and impairments:           Visit Diagnosis: Muscle weakness (generalized)    Problem List Patient Active Problem List   Diagnosis Date Noted   Noncompliance with medication treatment due to intermittent use of medication 11/24/2015   Gastritis 11/24/2015   Allergic rhinitis  09/03/2015   GERD (gastroesophageal reflux disease) 09/03/2015   Other vitamin B12 deficiency anemia 09/10/2012   Diabetic polyneuropathy ([place]) 09/10/2012   Radiculopathy of lumbar region 07/24/2012   History of pulmonary embolism 07/24/2012   Hypertension associated with diabetes ([place]) 04/30/2012   Vasomotor flushing 04/30/2012   Hyperlipidemia with target LDL less than 70 11/16/2011   Diabetes mellitus with atherosclerosis of arteries of extremities ([place]) 11/14/2011   Peripheral vascular disease ([place])    History of tobacco abuse    Coronary artery disease     Luberta Mutter, OTR/L 08/26/2021, 9:07 AM  [place], [place], [address] Phone: [phone]   Fax:  [phone]  Name: Khallid Pasillas Petruzzi MRN: [id number] Date of Birth: Oct 19, 1950

## 2021-08-30 ENCOUNTER — Encounter: Payer: Self-pay | Admitting: Occupational Therapy

## 2021-08-30 ENCOUNTER — Ambulatory Visit: Payer: Medicare Other | Admitting: Occupational Therapy

## 2021-08-30 ENCOUNTER — Ambulatory Visit: Payer: Medicare Other | Admitting: Physical Therapy

## 2021-08-30 ENCOUNTER — Other Ambulatory Visit: Payer: Self-pay

## 2021-08-30 DIAGNOSIS — R2689 Other abnormalities of gait and mobility: Secondary | ICD-10-CM

## 2021-08-30 DIAGNOSIS — R278 Other lack of coordination: Secondary | ICD-10-CM

## 2021-08-30 DIAGNOSIS — M6281 Muscle weakness (generalized): Secondary | ICD-10-CM

## 2021-08-30 DIAGNOSIS — R2681 Unsteadiness on feet: Secondary | ICD-10-CM

## 2021-08-30 DIAGNOSIS — R262 Difficulty in walking, not elsewhere classified: Secondary | ICD-10-CM

## 2021-08-30 DIAGNOSIS — R269 Unspecified abnormalities of gait and mobility: Secondary | ICD-10-CM

## 2021-08-30 NOTE — Therapy (Signed)
[facility] MAIN [facility] SERVICES [address] [place], [place], [address] Phone: [phone]   Fax:  [phone]  Physical Therapy Treatment  Patient Details  Name: Johnathan Hernandez MRN: [id number] Date of Birth: Nov 27, 1950 No data recorded  Encounter Date: 08/30/2021   PT End of Session - 08/30/21 1716     Visit Number 4    Number of Visits 24    Date for PT Re-Evaluation 11/11/21    PT Start Time 1430    PT Stop Time 1515    PT Time Calculation (min) 45 min    Equipment Utilized During Treatment Gait belt    Activity Tolerance Patient tolerated treatment well    Behavior During Therapy [facility] for tasks assessed/performed             Past Medical History:  Diagnosis Date   Coronary artery disease    Diabetes mellitus    History of tobacco abuse    quit in 2006 after 5 vessel cabg   Hyperlipidemia    Hypertension    Peripheral vascular disease [facility]) Dec 2012   [facility], treated with extensive arterial bypass    Past Surgical History:  Procedure Laterality Date   AORTA - ILIAC ARTERY BYPASS GRAFT  dec 2012   [facility]   CORONARY ARTERY BYPASS GRAFT  2006   [place]    There were no vitals filed for this visit.   Subjective Assessment - 08/30/21 1436     Subjective Pt reports he had a bad day on Saturday and had issues with BG management and had significant pain in most of his joints (legs and arms/ hands). Pt reports he did feel a littl ebetter yesterday. Pt reports he did not have pain like this prior to his stroke and it is a relatively new experience.    Patient is accompained by: Family member    Pertinent History Pt reports he had a stroke on May 21, 2021. Pt reports he went to hospital multiple times prior to his stroke. Pt reports numbness and pain  in his foot, and in his R hand, right arm and in the right side of his face. Pt report increased pain and discomfort in his right side when it gets cold outside. Pt reports he used to  perform yard work such as mowing, getting up leaves, gardening and other outdoor activities. Pt reports his DTR is staying with him now and helping out as needed. reports he would like to be independent where she does not have to stay with him.  Pt reports one fall when he was entering his house and his leg gave out (R) and her reports losing his balance once in his bathroom (second fall) and one stumble but was able to catch himself to prevent him from falling in the past several months. Relevant history of T2 DM, neuropathy, CAD, HTN and hyperlipedemia.    Limitations Sitting    How long can you sit comfortably? n/a    How long can you stand comfortably? 5 min    How long can you walk comfortably? 50 feet in house to bathroom, to car for appointments    Patient Stated Goals Balance, strenght, mobility, prevent falls    Pain Score 6     Pain Location Knee    Pain Orientation Right    Pain Descriptors / Indicators Aching;Tightness    Pain Type Chronic pain    Pain Onset More than a month ago  Pain Frequency Constant    Multiple Pain Sites No               Exercise/Activity Sets/Reps/Time/ Resistance Assistance Charge type Comments  Hamstring curls  2 x 10 RTB   Therex  To improve LE strength   Heel raises  2 x 15  B UE   Therex To imrpove ankle strength   Nustep for aerobic priming UE/LE 1 min: 1 min  Level 0: level 3 interval training   Therex  Pt rated 6 RPE   STS- 2 in step under L LE  X 10 CGA  Neuro RE ed  To improve weightbearing and focus on R LE when transitioning to standing    Ambulation with SBQC X 4 laps  CGA  TherACT Cues for proper gait pattern, pt started with very long step with R LE causing balance difficultues                                        Exercise/Activity Sets/ Reps/Time/ Resistance Assistance Charge type Comments                                SLS progression - LE airex 1 LE on step  3 x 30 sec ea   Neuro re-ed Pt reports ankle feels like it  wants to turn over with R LE on airex. No LOB  Adducted stance - airex pad  2 x 45 sec  CGA  Neuro re- ed  To improve static balance on unsteady surfaces  Added head turns for set 2 and 3, increased sway wit head turns and intermittennt LOB corrected by CGA                           Treatment provided this session   Pt educated throughout session about proper posture and technique with exercises. Improved exercise technique, movement at target joints, use of target muscles after min to mod verbal, visual, tactile cues. Note: Portions of this document were prepared using Dragon voice recognition software and although reviewed [place] contain unintentional dictation errors in syntax, grammar, or spelling.                              PT Short Term Goals - 08/19/21 1700       PT SHORT TERM GOAL #1   Title Patient will be independent in home exercise program to improve strength/mobility for better functional independence with ADLs.    Baseline No HEP    Time 4    Period Weeks    Status New    Target Date 09/16/21               PT Long Term Goals - 08/19/21 1701       PT LONG TERM GOAL #1   Title Patient will increase FOTO score to equal to or greater than 52    to demonstrate statistically significant improvement in mobility and quality of life.    Baseline 44 on 1/5    Time 12    Period Weeks    Status New    Target Date 11/11/21      PT LONG TERM GOAL #2   Title Patient (> 73 years old) will  complete five times sit to stand test in < 15 seconds indicating an increased LE strength and improved balance.    Baseline 34.35 with B UE    Time 12    Period Weeks    Status New    Target Date 11/12/21      PT LONG TERM GOAL #3   Title Patient will increase Berg Balance score by > 6 points to demonstrate decreased fall risk during functional activities.    Baseline 42 on 08/20/21    Time 12    Period Weeks    Status New    Target Date 11/12/21       PT LONG TERM GOAL #4   Title Patient will increase 10 meter walk test with LRAD to >1.84m/s as to improve gait speed for better community ambulation and to reduce fall risk.    Baseline .71 with RW on 08/20/21    Time 12    Period Weeks    Status New    Target Date 11/12/21      PT LONG TERM GOAL #5   Title Patient will reduce timed up and go to <15 seconds with LRAD to reduce fall risk and demonstrate improved transfer/gait ability.    Baseline 23.95 sec with RW on 08/20/21    Time 12    Period Weeks    Status New    Target Date 11/12/21                   Plan - 08/30/21 1720     Clinical Impression Statement Patient presents with excellent motivation for completion of physical therapy training session. Began with aerobic priming in order to improve patient's ability to learn various tasks within session. Progressed with walking with a SBQC snd pt tolersted well following practice. Also progressed with several balance related activities in order to improve the balance as well as improve his ability to ambulate with less need for assistance. Patient will continue to benefit from skilled physical therapy interventions in order to improve his lower extremity strength, balance, ambulatory capacity, and overall quality of life.    Personal Factors and Comorbidities Age;Comorbidity 1;Comorbidity 2;Comorbidity 3+    Comorbidities T2 DM, neuropathy, CAD, HTN, hyperlipedemia    Examination-Activity Limitations Carry;Locomotion Level;Squat;Stairs;Stand;Transfers    Examination-Participation Restrictions Community Activity;Yard Work    Conservation officer, historic buildings Evolving/Moderate complexity    Rehab Potential Good    PT Frequency 2x / week    PT Duration 12 weeks    PT Treatment/Interventions ADLs/Self Care Home Management;Electrical Stimulation;Gait training;DME Instruction;Stair training;Functional mobility training;Therapeutic activities;Therapeutic exercise;Balance  training;Neuromuscular re-education;Patient/family education;Passive range of motion;Energy conservation;Joint Manipulations    PT Next Visit Plan Balance, enduraqnce, strength, nustep for aerobic priming and endurance    PT Home Exercise Plan HEP    Consulted and Agree with Plan of Care Patient             Patient will benefit from skilled therapeutic intervention in order to improve the following deficits and impairments:  Abnormal gait, Decreased activity tolerance, Decreased endurance, Decreased strength, Decreased balance, Decreased mobility, Difficulty walking, Impaired tone  Visit Diagnosis: Abnormality of gait and mobility  Difficulty in walking, not elsewhere classified  Other abnormalities of gait and mobility  Unsteadiness on feet     Problem List Patient Active Problem List   Diagnosis Date Noted   Noncompliance with medication treatment due to intermittent use of medication 11/24/2015   Gastritis 11/24/2015   Allergic rhinitis 09/03/2015   GERD (  gastroesophageal reflux disease) 09/03/2015   Other vitamin B12 deficiency anemia 09/10/2012   Diabetic polyneuropathy (HCC) 09/10/2012   Radiculopathy of lumbar region 07/24/2012   History of pulmonary embolism 07/24/2012   Hypertension associated with diabetes (HCC) 04/30/2012   Vasomotor flushing 04/30/2012   Hyperlipidemia with target LDL less than 70 11/16/2011   Diabetes mellitus with atherosclerosis of arteries of extremities (HCC) 11/14/2011   Peripheral vascular disease (HCC)    History of tobacco abuse    Coronary artery disease     Norman Herrlichhristopher B Alanea Woolridge, PT 08/30/2021, 5:27 PM  [facility] MAIN [facility] [address] [place], [place], [address] Phone: [phone]   Fax:  [phone]  Name: Bernadene Bellhomas L Wrenn MRN: [id number] Date of Birth: 11/15/1950

## 2021-08-30 NOTE — Therapy (Signed)
[place] MAIN [facility] SERVICES [address] [place], [place], [address] Phone: [phone]   Fax:  [phone]  Occupational Therapy Treatment  Patient Details  Name: Johnathan Hernandez MRN: [id number] Date of Birth: [birth year] Referring Provider (OT): Cheri Kearns   Encounter Date: 08/30/2021   OT End of Session - 08/30/21 1707     Visit Number 12    Number of Visits 24    Date for OT Re-Evaluation 09/28/21    Authorization Time Period Progress reporting period starting 07/06/2021    OT Start Time 1518    OT Stop Time 1600    OT Time Calculation (min) 42 min    Activity Tolerance Patient tolerated treatment well    Behavior During Therapy [facility] for tasks assessed/performed             Past Medical History:  Diagnosis Date   Coronary artery disease    Diabetes mellitus    History of tobacco abuse    quit in 2006 after 5 vessel cabg   Hyperlipidemia    Hypertension    Peripheral vascular disease [facility]) Dec 2012   [facility], treated with extensive arterial bypass    Past Surgical History:  Procedure Laterality Date   AORTA - ILIAC ARTERY BYPASS GRAFT  dec 2012   [facility]   CORONARY ARTERY BYPASS GRAFT  2006   [place]    There were no vitals filed for this visit.   Subjective Assessment - 08/30/21 1706     Subjective  Pt. reports having had a rough day on Saturday.    Patient is accompanied by: Family member    Pertinent History Pt. is a 71 y.o. male who was diagnosed with a CVA with right sided weakness. Pt. was admitted to [facility], and received inpatient rehab services. Pt.'s PMHx includes: DM Type II, Diabetic peripheral neuropathy, CAD, HTN, Hyperlipidemia, VTE. Pt. enjoys working out in his yard, and fishing.    Currently in Pain? Yes    Pain Score 6     Pain Location Flank    Pain Orientation Left    Pain Descriptors / Indicators Aching    Pain Type Acute pain    Pain Score 4    Pain Location Shoulder    Pain Orientation Right     Pain Descriptors / Indicators Aching           OT Treatment  Therapeutic Ex.   Pt. performed AROM followed by PROM for right shoulder flexion, abduction, horizontal adduction, and abduction, forearm supination following moist heat.  Pt. performed 1.5# dowel ex. For shoulder flexion, chest press, and circular motion. Pt. perfromed 2# dumbbell ex. for elbow flexion and extension,  2# for forearm supination/pronation, wrist flexion/extension, and radial deviation. Pt. requires rest breaks and verbal cues for proper technique. For UE strengthening secondary to weakness. Pt. worked on [employer], and reciprocal motion using the UBE while seated for 8 min. with minimal resistance. Constant monitoring was provided.    Manual Therapy:   Pt. tolerated gentle scapular glides in elevation, depression, abduct./rotation following moist heat modality. Pt. tolerated gentle soft tissue massage to the musculature of the scapular region secondary increased tone, and tightness. Pt. tolerated soft tissue mobilizations for metacarpal spread stretches. Manual therapy was performed independent of, and in preparation for ROM.   Pt. reports having had an episode of facial numbness, and slurred speech on Saturday. Pt. reports that he has been trying to schedule his carotid stent procedure,  however has not been able to get in contact with them. Pt. presents with limited RUE ROM, strength, motor control, and [facility] skills. Pt. tolerated the manual therapy, and exercises well. Pt. continues to require cues to avoid compensation proximally. Pt. required cues, and visual demonstration for proper technique. Pt. responded well to there. Ex., manual therapy, and coordination. Pt. continues to work towards improving right shoulder ROM, strength, motor control, and [facility] skills in order to improve RUE functioning in preparation for ADLs, and IADL tasks.                            OT Education - 08/30/21  1707     Education Details ROM, strength, grip strength    Person(s) Educated Patient    Methods Explanation    Comprehension Verbalized understanding              OT Short Term Goals - 08/23/21 1549       OT SHORT TERM GOAL #1   Title Pt. will demonstrate independence with a HEP for the RUE.    Baseline Independent with Theraputty exercises. Eval: No current HEP program.    Time 6    Period Weeks    Status Partially Met    Target Date 10/04/21               OT Long Term Goals - 08/23/21 1550       OT LONG TERM GOAL #1   Title Pt. will increase his FOTO score by 2 pts. for clinically significant ADL related change.    Baseline 10th visit: FOTO: 59 Eval: FOTO score: 58, TR: 70    Time 12    Period Weeks    Status New    Target Date 09/28/21      OT LONG TERM GOAL #2   Title Pt. will improve right shoulder flexion by 10 degrees to be able to reach up to access cabinetry, and shelves.    Baseline 10th visit: 125(150) Eval: Shoulder flexion: 111(127)    Time 12    Period Weeks    Status On-going    Target Date 09/28/21      OT LONG TERM GOAL #3   Title Pt. will improve right hand [facility] skills by 5 sec. of speed to assist with buttoning.    Baseline 10th visit: 37 sec. Eval: R: 42 sec., L: 27 sec.    Time 12    Period Weeks    Status On-going    Target Date 09/28/21      OT LONG TERM GOAL #4   Title Pt. will perform light meal prep with modified independence using using work simplification strategies as needed    Baseline Pt. prepared dinner 2 days using the oven, and stovetop using the counter for support seconadry to back pain. Eval: Pt. is unable    Time 12    Period Weeks    Status On-going    Target Date 09/28/21      OT LONG TERM GOAL #5   Title Pt. will independently be able to perfrom self-feeding using his right hand    Baseline 10th visit: Modified independent with an adaptive utensil. with occasionally dropping food. Eval: Pt. has difficulty  performing self-feeding with his right hand    Time 12    Period Weeks    Target Date 09/28/21      OT LONG TERM GOAL #6   Title Pt.  will perform light homemaking tasks with Modified Independence.    Baseline 10th visit:  independent making the bed.Eval: Pt. is not performing home management tasks.    Time 12    Status On-going    Target Date 09/28/21                   Plan - 08/30/21 1710     Clinical Impression Statement Pt. reports having had an episode of facial numbness, and slurred speech on Saturday. Pt. reports that he has been trying to schedule his carotid stent procedure, however has not been able to get in contact with them. Pt. presents with limited RUE ROM, strength, motor control, and [facility] skills. Pt. tolerated the manual therapy, and exercises well. Pt. continues to require cues to avoid compensation proximally. Pt. required cues, and visual demonstration for proper technique. Pt. responded well to there. Ex., manual therapy, and coordination. Pt. continues to work towards improving right shoulder ROM, strength, motor control, and [facility] skills in order to improve RUE functioning in preparation for ADLs, and IADL tasks.              OT Occupational Profile and History Detailed Assessment- Review of Records and additional review of physical, cognitive, psychosocial history related to current functional performance    Occupational performance deficits (Please refer to evaluation for details): IADL's;ADL's;Leisure    Rehab Potential Good    Clinical Decision Making Several treatment options, min-mod task modification necessary    Comorbidities Affecting Occupational Performance: Obi have comorbidities impacting occupational performance    Modification or Assistance to Complete Evaluation  Min-Moderate modification of tasks or assist with assess necessary to complete eval    OT Frequency 2x / week    OT Duration 12 weeks    OT Treatment/Interventions Self-care/ADL  training;Therapeutic exercise;Neuromuscular education;Electrical Stimulation;Moist Heat;Passive range of motion;Therapeutic activities;Patient/family education;DME and/or AE instruction;Functional Mobility Training;Energy conservation;Manual Therapy    Consulted and Agree with Plan of Care Patient             Patient will benefit from skilled therapeutic intervention in order to improve the following deficits and impairments:           Visit Diagnosis: Muscle weakness (generalized)  Other lack of coordination    Problem List Patient Active Problem List   Diagnosis Date Noted   Noncompliance with medication treatment due to intermittent use of medication 11/24/2015   Gastritis 11/24/2015   Allergic rhinitis 09/03/2015   GERD (gastroesophageal reflux disease) 09/03/2015   Other vitamin B12 deficiency anemia 09/10/2012   Diabetic polyneuropathy ([place]) 09/10/2012   Radiculopathy of lumbar region 07/24/2012   History of pulmonary embolism 07/24/2012   Hypertension associated with diabetes ([place]) 04/30/2012   Vasomotor flushing 04/30/2012   Hyperlipidemia with target LDL less than 70 11/16/2011   Diabetes mellitus with atherosclerosis of arteries of extremities ([place]) 11/14/2011   Peripheral vascular disease ([place])    History of tobacco abuse    Coronary artery disease     Harrel Carina, MS, OTR/L 08/30/2021, 5:27 PM  [place], [place], [address] Phone: [phone]   Fax:  [phone]  Name: Johnathan Hernandez MRN: [id number] Date of Birth: 02/11/1951

## 2021-09-01 ENCOUNTER — Ambulatory Visit: Payer: Medicare Other | Admitting: Occupational Therapy

## 2021-09-01 ENCOUNTER — Ambulatory Visit: Payer: Medicare Other | Admitting: Physical Therapy

## 2021-09-06 ENCOUNTER — Ambulatory Visit: Payer: Medicare Other | Admitting: Physical Therapy

## 2021-09-06 ENCOUNTER — Ambulatory Visit: Payer: Medicare Other | Admitting: Occupational Therapy

## 2021-09-07 ENCOUNTER — Telehealth: Payer: Self-pay | Admitting: Physical Therapy

## 2021-09-07 NOTE — Telephone Encounter (Signed)
Called patient about NS to 09/06/21 PT and OT appts. Daughter picked up and said he was at another Dr appt right now.  She said he called, but no one ever called him back.  She believes he was calling the wrong number.  She has correct number now, as it was on caller ID. Daughter stated he needed to cancel wed due to Ultrasound appt.  Confirmed Monday's appts.

## 2021-09-08 ENCOUNTER — Ambulatory Visit: Payer: Medicare Other | Admitting: Occupational Therapy

## 2021-09-08 ENCOUNTER — Ambulatory Visit: Payer: Medicare Other | Admitting: Physical Therapy

## 2021-09-13 ENCOUNTER — Other Ambulatory Visit: Payer: Self-pay

## 2021-09-13 ENCOUNTER — Ambulatory Visit: Payer: Medicare Other | Admitting: Physical Therapy

## 2021-09-13 ENCOUNTER — Encounter: Payer: Self-pay | Admitting: Occupational Therapy

## 2021-09-13 ENCOUNTER — Ambulatory Visit: Payer: Medicare Other | Admitting: Occupational Therapy

## 2021-09-13 VITALS — BP 105/75

## 2021-09-13 DIAGNOSIS — R2681 Unsteadiness on feet: Secondary | ICD-10-CM

## 2021-09-13 DIAGNOSIS — M6281 Muscle weakness (generalized): Secondary | ICD-10-CM

## 2021-09-13 DIAGNOSIS — R278 Other lack of coordination: Secondary | ICD-10-CM

## 2021-09-13 DIAGNOSIS — R262 Difficulty in walking, not elsewhere classified: Secondary | ICD-10-CM

## 2021-09-13 DIAGNOSIS — R269 Unspecified abnormalities of gait and mobility: Secondary | ICD-10-CM

## 2021-09-13 DIAGNOSIS — R2689 Other abnormalities of gait and mobility: Secondary | ICD-10-CM

## 2021-09-13 NOTE — Therapy (Signed)
[place] MAIN [facility] SERVICES [address] [place], [place], [address] Phone: [phone]   Fax:  [phone]  Occupational Therapy Treatment  Patient Details  Name: Johnathan Hernandez MRN: [id number] Date of Birth: [birth year] Referring Provider (OT): Cheri Kearns   Encounter Date: 09/13/2021   OT End of Session - 09/13/21 1529     Visit Number 13    Number of Visits 24    Date for OT Re-Evaluation 09/28/21    Authorization Time Period Progress reporting period starting 07/06/2021    OT Start Time 1345    OT Stop Time 1430    OT Time Calculation (min) 45 min    Activity Tolerance Patient tolerated treatment well    Behavior During Therapy [facility] for tasks assessed/performed             Past Medical History:  Diagnosis Date   Coronary artery disease    Diabetes mellitus    History of tobacco abuse    quit in 2006 after 5 vessel cabg   Hyperlipidemia    Hypertension    Peripheral vascular disease [facility]) Dec 2012   [facility], treated with extensive arterial bypass    Past Surgical History:  Procedure Laterality Date   AORTA - ILIAC ARTERY BYPASS GRAFT  dec 2012   [facility]   CORONARY ARTERY BYPASS GRAFT  2006   [place]    There were no vitals filed for this visit.   Subjective Assessment - 09/13/21 1528     Subjective  Pt. reports having trouble with his night time sleep patterns.    Patient is accompanied by: Family member    Pertinent History Pt. is a 71 y.o. male who was diagnosed with a CVA with right sided weakness. Pt. was admitted to [facility], and received inpatient rehab services. Pt.'s PMHx includes: DM Type II, Diabetic peripheral neuropathy, CAD, HTN, Hyperlipidemia, VTE. Pt. enjoys working out in his yard, and fishing.    Currently in Pain? Yes    Pain Score 2     Pain Location Shoulder    Pain Orientation Left            Therapeutic Ex.   Pt. performed AROM followed by PROM for right shoulder flexion, abduction,  horizontal adduction, and abduction, forearm supination following moist heat.  Pt.  Tolerated AROM followed by PROM for the MP, PIP, and DIP flexors. The Gripper was set to 17.9# of grip strength resistance for the right.    Neuromuscular re-education:  Pt. worked on grasping 1" resistive cubes alternating thumb opposition to the tip of the 2nd through 5th digits while the board is placed at a vertical angle. Pt. worked on pressing the cubes back into place while alternating isolated 2nd through 5th digit extension. Pt. worked on grasping, flipping, turning, and stacking [place] style discs. Pt. worked on isolated 2nd digit movements when turning the discs. Pt. worked on translatory movements moving the discs through his hand from his palm to the tip of his 2nd digit to thumb. Pt. required visual demonstration, and cues for movement patterns. Pt. worked on speed, and Soil scientist.       Pt. reports that he had a multiple MD appointments at [facility]. Pt. Reports having had injections to help with pain. Pt. presents with limited RUE ROM, strength, motor control, and [facility] skills. Pt. required cues to avoid compensation proximally. Pt. required cues, and visual demonstration for proper technique.  Pt. responded well to there. Ex., manual  therapy, and coordination. Pt. continues to work towards improving right shoulder ROM, strength, motor control, and [facility] skills in order to improve RUE functioning in preparation for ADLs, and IADL tasks.                              OT Education - 09/13/21 1528     Education Details ROM, strength, grip strength, [place]    Person(s) Educated Patient    Methods Explanation    Comprehension Verbalized understanding              OT Short Term Goals - 08/23/21 1549       OT SHORT TERM GOAL #1   Title Pt. will demonstrate independence with a HEP for the RUE.    Baseline Independent with Theraputty exercises. Eval: No current HEP program.     Time 6    Period Weeks    Status Partially Met    Target Date 10/04/21               OT Long Term Goals - 08/23/21 1550       OT LONG TERM GOAL #1   Title Pt. will increase his FOTO score by 2 pts. for clinically significant ADL related change.    Baseline 10th visit: FOTO: 59 Eval: FOTO score: 58, TR: 70    Time 12    Period Weeks    Status New    Target Date 09/28/21      OT LONG TERM GOAL #2   Title Pt. will improve right shoulder flexion by 10 degrees to be able to reach up to access cabinetry, and shelves.    Baseline 10th visit: 125(150) Eval: Shoulder flexion: 111(127)    Time 12    Period Weeks    Status On-going    Target Date 09/28/21      OT LONG TERM GOAL #3   Title Pt. will improve right hand [facility] skills by 5 sec. of speed to assist with buttoning.    Baseline 10th visit: 37 sec. Eval: R: 42 sec., L: 27 sec.    Time 12    Period Weeks    Status On-going    Target Date 09/28/21      OT LONG TERM GOAL #4   Title Pt. will perform light meal prep with modified independence using using work simplification strategies as needed    Baseline Pt. prepared dinner 2 days using the oven, and stovetop using the counter for support seconadry to back pain. Eval: Pt. is unable    Time 12    Period Weeks    Status On-going    Target Date 09/28/21      OT LONG TERM GOAL #5   Title Pt. will independently be able to perfrom self-feeding using his right hand    Baseline 10th visit: Modified independent with an adaptive utensil. with occasionally dropping food. Eval: Pt. has difficulty performing self-feeding with his right hand    Time 12    Period Weeks    Target Date 09/28/21      OT LONG TERM GOAL #6   Title Pt. will perform light homemaking tasks with Modified Independence.    Baseline 10th visit:  independent making the bed.Eval: Pt. is not performing home management tasks.    Time 12    Status On-going    Target Date 09/28/21  Plan  - 09/13/21 1529     Clinical Impression Statement Pt. reports that he had a multiple MD appointments at [facility]. Pt. Reports having had injections to help with pain. Pt. presents with limited RUE ROM, strength, motor control, and [facility] skills. Pt. required cues to avoid compensation proximally. Pt. required cues, and visual demonstration for proper technique.  Pt. responded well to there. Ex., manual therapy, and coordination. Pt. continues to work towards improving right shoulder ROM, strength, motor control, and [facility] skills in order to improve RUE functioning in preparation for ADLs, and IADL tasks.      OT Occupational Profile and History Detailed Assessment- Review of Records and additional review of physical, cognitive, psychosocial history related to current functional performance    Occupational performance deficits (Please refer to evaluation for details): IADL's;ADL's;Leisure    Rehab Potential Good    Clinical Decision Making Several treatment options, min-mod task modification necessary    Comorbidities Affecting Occupational Performance: [place] have comorbidities impacting occupational performance    Modification or Assistance to Complete Evaluation  Min-Moderate modification of tasks or assist with assess necessary to complete eval    OT Frequency 2x / week    OT Duration 12 weeks    OT Treatment/Interventions Self-care/ADL training;Therapeutic exercise;Neuromuscular education;Electrical Stimulation;Moist Heat;Passive range of motion;Therapeutic activities;Patient/family education;DME and/or AE instruction;Functional Mobility Training;Energy conservation;Manual Therapy    Consulted and Agree with Plan of Care Patient             Patient will benefit from skilled therapeutic intervention in order to improve the following deficits and impairments:           Visit Diagnosis: Muscle weakness (generalized)  Other lack of coordination    Problem List Patient Active Problem List    Diagnosis Date Noted   Noncompliance with medication treatment due to intermittent use of medication 11/24/2015   Gastritis 11/24/2015   Allergic rhinitis 09/03/2015   GERD (gastroesophageal reflux disease) 09/03/2015   Other vitamin B12 deficiency anemia 09/10/2012   Diabetic polyneuropathy ([place]) 09/10/2012   Radiculopathy of lumbar region 07/24/2012   History of pulmonary embolism 07/24/2012   Hypertension associated with diabetes ([place]) 04/30/2012   Vasomotor flushing 04/30/2012   Hyperlipidemia with target LDL less than 70 11/16/2011   Diabetes mellitus with atherosclerosis of arteries of extremities ([place]) 11/14/2011   Peripheral vascular disease ([place])    History of tobacco abuse    Coronary artery disease     Harrel Carina, MS, OTR/L 09/13/2021, 3:30 PM  [place] [address] [place], [place], [address] Phone: [phone]   Fax:  [phone]  Name: Johnathan Hernandez MRN: [id number] Date of Birth: 1951-01-28

## 2021-09-13 NOTE — Therapy (Signed)
[facility] MAIN [facility] SERVICES [address] [place], [place], [address] Phone: [phone]   Fax:  [phone]  Physical Therapy Treatment  Patient Details  Name: Johnathan Hernandez MRN: [id number] Date of Birth: October 15, 1950 No data recorded  Encounter Date: 09/13/2021   PT End of Session - 09/13/21 1333     Visit Number 5    Number of Visits 24    Date for PT Re-Evaluation 11/11/21    PT Start Time 1302    PT Stop Time 1345    PT Time Calculation (min) 43 min    Equipment Utilized During Treatment Gait belt    Activity Tolerance Patient tolerated treatment well    Behavior During Therapy [facility] for tasks assessed/performed             Past Medical History:  Diagnosis Date   Coronary artery disease    Diabetes mellitus    History of tobacco abuse    quit in 2006 after 5 vessel cabg   Hyperlipidemia    Hypertension    Peripheral vascular disease [facility]) Dec 2012   [facility], treated with extensive arterial bypass    Past Surgical History:  Procedure Laterality Date   AORTA - ILIAC ARTERY BYPASS GRAFT  dec 2012   [facility]   CORONARY ARTERY BYPASS GRAFT  2006   [place]    Vitals:   09/13/21 1330  BP: 105/75     Subjective Assessment - 09/13/21 1358     Subjective Pt reports he had to miss past several visits secondary to other conflicting appointments. Pt reports he had some injections for trigger points and another appointment where he was told he had a "70% blockage" in an artery but there was not going to be any procedure to fix this at the time (under observation and follow up)    Patient is accompained by: Family member    Pertinent History Pt reports he had a stroke on May 21, 2021. Pt reports he went to hospital multiple times prior to his stroke. Pt reports numbness and pain  in his foot, and in his R hand, right arm and in the right side of his face. Pt report increased pain and discomfort in his right side when it gets cold  outside. Pt reports he used to perform yard work such as mowing, getting up leaves, gardening and other outdoor activities. Pt reports his DTR is staying with him now and helping out as needed. reports he would like to be independent where she does not have to stay with him.  Pt reports one fall when he was entering his house and his leg gave out (R) and her reports losing his balance once in his bathroom (second fall) and one stumble but was able to catch himself to prevent him from falling in the past several months. Relevant history of T2 DM, neuropathy, CAD, HTN and hyperlipedemia.    Limitations Sitting    How long can you sit comfortably? n/a    How long can you stand comfortably? 5 min    How long can you walk comfortably? 50 feet in house to bathroom, to car for appointments    Patient Stated Goals Balance, strenght, mobility, prevent falls    Currently in Pain? Yes    Pain Onset More than a month ago             Exercise/Activity Sets/Reps/Time/ Resistance Assistance Charge type Comments  Hamstring curls  2 x 10  YTB   Therex  To improve LE strength   Heel raises  2 x 10  B UE   Therex To imrpove ankle strength   Nustep for aerobic priming UE/LE Level 2 only   Therex  Pt rated 6 RPE   STS 2*10  Therex UE assist on chair arms                                              Exercise/Activity Sets/ Reps/Time/ Resistance Assistance Charge type Comments                                SLS progression - LE airex 1 LE on step  3 x 30 sec ea   Neuro re-ed Pt reports ankle feels like it wants to turn over with R LE on airex. No LOB  Adducted stance - airex pad  1 x 45 sec  CGA  Neuro re- ed  To improve static balance on unsteady surfaces  Added head turns for set 2 and 3, increased sway wit head turns and intermittennt LOB corrected by CGA                           Treatment provided this session   Pt educated throughout session about proper posture and technique with  exercises. Improved exercise technique, movement at target joints, use of target muscles after min to mod verbal, visual, tactile cues.  Note: Portions of this document were prepared using Dragon voice recognition software and although reviewed Styer contain unintentional dictation errors in syntax, grammar, or spelling.  Pt required occasional rest breaks due fatigue, PT was quick to ask when pt appeared to be fatiguing in order to prevent excessive fatigue.                           PT Education - 09/13/21 1400     Education Details Delayed onset muscle soreness (DOMS) feeling and benefits of appropriate strength training    Person(s) Educated Patient    Methods Explanation    Comprehension Verbalized understanding              PT Short Term Goals - 08/19/21 1700       PT SHORT TERM GOAL #1   Title Patient will be independent in home exercise program to improve strength/mobility for better functional independence with ADLs.    Baseline No HEP    Time 4    Period Weeks    Status New    Target Date 09/16/21               PT Long Term Goals - 08/19/21 1701       PT LONG TERM GOAL #1   Title Patient will increase FOTO score to equal to or greater than 52    to demonstrate statistically significant improvement in mobility and quality of life.    Baseline 44 on 1/5    Time 12    Period Weeks    Status New    Target Date 11/11/21      PT LONG TERM GOAL #2   Title Patient (> 73 years old) will complete five times sit to stand test in < 15 seconds indicating an  increased LE strength and improved balance.    Baseline 34.35 with B UE    Time 12    Period Weeks    Status New    Target Date 11/12/21      PT LONG TERM GOAL #3   Title Patient will increase Berg Balance score by > 6 points to demonstrate decreased fall risk during functional activities.    Baseline 42 on 08/20/21    Time 12    Period Weeks    Status New    Target Date 11/12/21       PT LONG TERM GOAL #4   Title Patient will increase 10 meter walk test with LRAD to >1.10m/s as to improve gait speed for better community ambulation and to reduce fall risk.    Baseline .71 with RW on 08/20/21    Time 12    Period Weeks    Status New    Target Date 11/12/21      PT LONG TERM GOAL #5   Title Patient will reduce timed up and go to <15 seconds with LRAD to reduce fall risk and demonstrate improved transfer/gait ability.    Baseline 23.95 sec with RW on 08/20/21    Time 12    Period Weeks    Status New    Target Date 11/12/21                   Plan - 09/13/21 1334     Clinical Impression Statement Patient presents with fair motivation for completion of physical therapy training session. Began with aerobic priming in order to improve patient's ability to learn various tasks within session. Pt reporgressed with several activities this session secondary to having significant discomfort (DOMS) folloiwng previous session and request to make thigs a little easier.  Patient will continue to benefit from skilled physical therapy interventions in order to improve his lower extremity strength, balance, ambulatory capacity, and overall quality of life.    Personal Factors and Comorbidities Age;Comorbidity 1;Comorbidity 2;Comorbidity 3+    Comorbidities T2 DM, neuropathy, CAD, HTN, hyperlipedemia    Examination-Activity Limitations Carry;Locomotion Level;Squat;Stairs;Stand;Transfers    Examination-Participation Restrictions Community Activity;Yard Work    Conservation officer, historic buildings Evolving/Moderate complexity    Rehab Potential Good    PT Frequency 2x / week    PT Duration 12 weeks    PT Treatment/Interventions ADLs/Self Care Home Management;Electrical Stimulation;Gait training;DME Instruction;Stair training;Functional mobility training;Therapeutic activities;Therapeutic exercise;Balance training;Neuromuscular re-education;Patient/family education;Passive range of  motion;Energy conservation;Joint Manipulations    PT Next Visit Plan Balance, enduraqnce, strength, nustep for aerobic priming and endurance    PT Home Exercise Plan HEP    Consulted and Agree with Plan of Care Patient             Patient will benefit from skilled therapeutic intervention in order to improve the following deficits and impairments:  Abnormal gait, Decreased activity tolerance, Decreased endurance, Decreased strength, Decreased balance, Decreased mobility, Difficulty walking, Impaired tone  Visit Diagnosis: Abnormality of gait and mobility  Difficulty in walking, not elsewhere classified  Unsteadiness on feet  Other abnormalities of gait and mobility     Problem List Patient Active Problem List   Diagnosis Date Noted   Noncompliance with medication treatment due to intermittent use of medication 11/24/2015   Gastritis 11/24/2015   Allergic rhinitis 09/03/2015   GERD (gastroesophageal reflux disease) 09/03/2015   Other vitamin B12 deficiency anemia 09/10/2012   Diabetic polyneuropathy ([facility]) 09/10/2012   Radiculopathy of lumbar region 07/24/2012  History of pulmonary embolism 07/24/2012   Hypertension associated with diabetes (HCC) 04/30/2012   Vasomotor flushing 04/30/2012   Hyperlipidemia with target LDL less than 70 11/16/2011   Diabetes mellitus with atherosclerosis of arteries of extremities ([facility]CC) 11/14/2011   Peripheral vascular disease (HCC)    History of tobacco abuse    Coronary artery disease     Norman Herrlichhristopher B Zahari Fazzino, PT 09/13/2021, 2:01 PM  [place] [facility] [address] , [place], [address] Phone: [phone]   Fax:  [phone]  Name: Johnathan Hernandez MRN: [id number] Date of Birth: 03/26/1951

## 2021-09-15 ENCOUNTER — Ambulatory Visit: Payer: Medicare Other | Admitting: Occupational Therapy

## 2021-09-15 ENCOUNTER — Ambulatory Visit: Payer: Medicare Other | Attending: Student | Admitting: Physical Therapy

## 2021-09-15 ENCOUNTER — Telehealth: Payer: Self-pay | Admitting: Physical Therapy

## 2021-09-15 DIAGNOSIS — R278 Other lack of coordination: Secondary | ICD-10-CM | POA: Insufficient documentation

## 2021-09-15 DIAGNOSIS — R2681 Unsteadiness on feet: Secondary | ICD-10-CM | POA: Insufficient documentation

## 2021-09-15 DIAGNOSIS — R2689 Other abnormalities of gait and mobility: Secondary | ICD-10-CM | POA: Insufficient documentation

## 2021-09-15 DIAGNOSIS — R262 Difficulty in walking, not elsewhere classified: Secondary | ICD-10-CM | POA: Insufficient documentation

## 2021-09-15 DIAGNOSIS — R269 Unspecified abnormalities of gait and mobility: Secondary | ICD-10-CM | POA: Insufficient documentation

## 2021-09-15 DIAGNOSIS — M6281 Muscle weakness (generalized): Secondary | ICD-10-CM | POA: Insufficient documentation

## 2021-09-15 NOTE — Telephone Encounter (Signed)
Pt contacted to inform him of missed appointment and pt reminded of next scheduled appointment date and time.

## 2021-09-20 ENCOUNTER — Other Ambulatory Visit: Payer: Self-pay

## 2021-09-20 ENCOUNTER — Ambulatory Visit: Payer: Medicare Other | Admitting: Occupational Therapy

## 2021-09-20 ENCOUNTER — Ambulatory Visit: Payer: Medicare Other | Admitting: Physical Therapy

## 2021-09-22 ENCOUNTER — Other Ambulatory Visit: Payer: Self-pay

## 2021-09-22 ENCOUNTER — Encounter: Payer: Self-pay | Admitting: Physical Therapy

## 2021-09-22 ENCOUNTER — Encounter: Payer: Self-pay | Admitting: Occupational Therapy

## 2021-09-22 ENCOUNTER — Ambulatory Visit: Payer: Medicare Other | Admitting: Physical Therapy

## 2021-09-22 ENCOUNTER — Ambulatory Visit: Payer: Medicare Other | Admitting: Occupational Therapy

## 2021-09-22 DIAGNOSIS — M6281 Muscle weakness (generalized): Secondary | ICD-10-CM

## 2021-09-22 DIAGNOSIS — R2681 Unsteadiness on feet: Secondary | ICD-10-CM

## 2021-09-22 DIAGNOSIS — R262 Difficulty in walking, not elsewhere classified: Secondary | ICD-10-CM | POA: Diagnosis present

## 2021-09-22 DIAGNOSIS — R278 Other lack of coordination: Secondary | ICD-10-CM

## 2021-09-22 DIAGNOSIS — R269 Unspecified abnormalities of gait and mobility: Secondary | ICD-10-CM | POA: Diagnosis present

## 2021-09-22 DIAGNOSIS — R2689 Other abnormalities of gait and mobility: Secondary | ICD-10-CM | POA: Diagnosis present

## 2021-09-22 NOTE — Therapy (Signed)
[place] [facility] MAIN [facility] SERVICES [address] [place], [place], [address] Phone: [phone]   Fax:  [phone]  Physical Therapy Treatment  Patient Details  Name: Johnathan Hernandez MRN: [id number] Date of Birth: 1950/12/05 No data recorded  Encounter Date: 09/22/2021   PT End of Session - 09/22/21 1449     Visit Number 6    Number of Visits 24    Date for PT Re-Evaluation 11/11/21    PT Start Time 1432    PT Stop Time 1515    PT Time Calculation (min) 43 min    Equipment Utilized During Treatment Gait belt    Activity Tolerance Patient tolerated treatment well    Behavior During Therapy [facility] for tasks assessed/performed             Past Medical History:  Diagnosis Date   Coronary artery disease    Diabetes mellitus    History of tobacco abuse    quit in 2006 after 5 vessel cabg   Hyperlipidemia    Hypertension    Peripheral vascular disease [facility]) Dec 2012   [facility], treated with extensive arterial bypass    Past Surgical History:  Procedure Laterality Date   AORTA - ILIAC ARTERY BYPASS GRAFT  dec 2012   [facility]   CORONARY ARTERY BYPASS GRAFT  2006   [place]    There were no vitals filed for this visit.   Subjective Assessment - 09/22/21 1448     Subjective Patient reports doing okay. He was sick after last session with virus but has recovered now. He denies any pain/soreness currently; No new falls; Patient does present with multiple abrasions and is unsure how he got them. PT applied bandaids for safety;    Patient is accompained by: Family member    Pertinent History Pt reports he had a stroke on May 21, 2021. Pt reports he went to hospital multiple times prior to his stroke. Pt reports numbness and pain  in his foot, and in his R hand, right arm and in the right side of his face. Pt report increased pain and discomfort in his right side when it gets cold outside. Pt reports he used to perform yard work such as mowing, getting up  leaves, gardening and other outdoor activities. Pt reports his DTR is staying with him now and helping out as needed. reports he would like to be independent where she does not have to stay with him.  Pt reports one fall when he was entering his house and his leg gave out (R) and her reports losing his balance once in his bathroom (second fall) and one stumble but was able to catch himself to prevent him from falling in the past several months. Relevant history of T2 DM, neuropathy, CAD, HTN and hyperlipedemia.    Limitations Sitting    How long can you sit comfortably? n/a    How long can you stand comfortably? 5 min    How long can you walk comfortably? 50 feet in house to bathroom, to car for appointments    Patient Stated Goals Balance, strenght, mobility, prevent falls    Currently in Pain? No/denies    Pain Onset More than a month ago                 TREATMENT: Patient exhibits multiple abrasions requiring bandages at start of treatment;  Warm up on Nustep, BUE/BLE level 2 x4 min (Unbilled) Ex: Instructed patient in gait around  gym x150 feet with RW, CGA and cues to increase heel strike   Seated: Hamstring curl red tband x10 reps each LE with moderate difficulty;  Sit<>Stand holding small ball to challenge LE strengthening x5 reps, minimal difficulty with cues for forward weight shift for better LE mobility;  NMR: Standing on firm surface: Alternate march with 2-1-0 rail assist x15 reps, required CGA for safety but able to exhibit good control;  Standing with feet apart, side/side weight shift reaching up in diagonal to target to challenge lateral weight shift and dynamic balance x10 reps each with CGA for safety;   Standing on airex pad: Forward/bacward step up on airex pad x10 reps with 1-0 rail assist, min A for safety;  Feet apart: Unsupported standing x15 sec, minimal sway Progressed to ball toss/catch x10 reps with CGA for safety, good control/balance  Modified  tandem stance:  Unsupported standing 10 sec hold, CGA  Progressed to ball pass side/side x5 reps each foot in front                      PT Education - 09/22/21 1449     Education Details LE strengthening, balance;    Person(s) Educated Patient    Methods Explanation;Verbal cues    Comprehension Verbalized understanding;Returned demonstration;Verbal cues required;Need further instruction              PT Short Term Goals - 08/19/21 1700       PT SHORT TERM GOAL #1   Title Patient will be independent in home exercise program to improve strength/mobility for better functional independence with ADLs.    Baseline No HEP    Time 4    Period Weeks    Status New    Target Date 09/16/21               PT Long Term Goals - 08/19/21 1701       PT LONG TERM GOAL #1   Title Patient will increase FOTO score to equal to or greater than 52    to demonstrate statistically significant improvement in mobility and quality of life.    Baseline 44 on 1/5    Time 12    Period Weeks    Status New    Target Date 11/11/21      PT LONG TERM GOAL #2   Title Patient (> 30 years old) will complete five times sit to stand test in < 15 seconds indicating an increased LE strength and improved balance.    Baseline 34.35 with B UE    Time 12    Period Weeks    Status New    Target Date 11/12/21      PT LONG TERM GOAL #3   Title Patient will increase Berg Balance score by > 6 points to demonstrate decreased fall risk during functional activities.    Baseline 42 on 08/20/21    Time 12    Period Weeks    Status New    Target Date 11/12/21      PT LONG TERM GOAL #4   Title Patient will increase 10 meter walk test with LRAD to >1.19m/s as to improve gait speed for better community ambulation and to reduce fall risk.    Baseline .71 with RW on 08/20/21    Time 12    Period Weeks    Status New    Target Date 11/12/21      PT LONG TERM GOAL #5  Title Patient will reduce  timed up and go to <15 seconds with LRAD to reduce fall risk and demonstrate improved transfer/gait ability.    Baseline 23.95 sec with RW on 08/20/21    Time 12    Period Weeks    Status New    Target Date 11/12/21                   Plan - 09/22/21 1520     Clinical Impression Statement Patient motivated and participated well within session. He was instructed in advanced LE strengthening. He does require min VCS for proper exercise technique. Patient reports increased difficulty with added resistance. He was instructed in advanced balance tasks, working on reducing rail assist to challenge stance control. Patient does exhibit increased difficulty with narrow base of support, especially when reaching outside base of support. He required CGA to min A throughout. Patient would benefit from additional skilled PT Intervention to improve strength, balance and gait safety;    Personal Factors and Comorbidities Age;Comorbidity 1;Comorbidity 2;Comorbidity 3+    Comorbidities T2 DM, neuropathy, CAD, HTN, hyperlipedemia    Examination-Activity Limitations Carry;Locomotion Level;Squat;Stairs;Stand;Transfers    Examination-Participation Restrictions Community Activity;Yard Work    Conservation officer, historic buildings Evolving/Moderate complexity    Rehab Potential Good    PT Frequency 2x / week    PT Duration 12 weeks    PT Treatment/Interventions ADLs/Self Care Home Management;Electrical Stimulation;Gait training;DME Instruction;Stair training;Functional mobility training;Therapeutic activities;Therapeutic exercise;Balance training;Neuromuscular re-education;Patient/family education;Passive range of motion;Energy conservation;Joint Manipulations    PT Next Visit Plan Balance, enduraqnce, strength, nustep for aerobic priming and endurance    PT Home Exercise Plan HEP    Consulted and Agree with Plan of Care Patient             Patient will benefit from skilled therapeutic intervention in  order to improve the following deficits and impairments:  Abnormal gait, Decreased activity tolerance, Decreased endurance, Decreased strength, Decreased balance, Decreased mobility, Difficulty walking, Impaired tone  Visit Diagnosis: Muscle weakness (generalized)  Other lack of coordination  Abnormality of gait and mobility  Difficulty in walking, not elsewhere classified  Unsteadiness on feet  Other abnormalities of gait and mobility     Problem List Patient Active Problem List   Diagnosis Date Noted   Noncompliance with medication treatment due to intermittent use of medication 11/24/2015   Gastritis 11/24/2015   Allergic rhinitis 09/03/2015   GERD (gastroesophageal reflux disease) 09/03/2015   Other vitamin B12 deficiency anemia 09/10/2012   Diabetic polyneuropathy (HCC) 09/10/2012   Radiculopathy of lumbar region 07/24/2012   History of pulmonary embolism 07/24/2012   Hypertension associated with diabetes (HCC) 04/30/2012   Vasomotor flushing 04/30/2012   Hyperlipidemia with target LDL less than 70 11/16/2011   Diabetes mellitus with atherosclerosis of arteries of extremities (HCC) 11/14/2011   Peripheral vascular disease (HCC)    History of tobacco abuse    Coronary artery disease     Johnathan Hernandez, PT, DPT 09/22/2021, 3:22 PM  [place] [facility] MAIN [facility] SERVICES [address] [place], [place], [address] Phone: [phone]   Fax:  [phone]  Name: Johnathan Hernandez MRN: [id number] Date of Birth: 1951/05/17

## 2021-09-22 NOTE — Therapy (Addendum)
[place] [facility] MAIN [facility] SERVICES [address] [place], [place], [address] Phone: [phone]   Fax:  [phone]  Occupational Therapy Treatment  Patient Details  Name: Johnathan Hernandez MRN: [id number] Date of Birth: [birth year] Referring Provider (OT): Mayme Genta   Encounter Date: 09/22/2021   OT End of Session - 09/22/21 1726     Visit Number 14    Number of Visits 24    Date for OT Re-Evaluation 09/28/21    Authorization Time Period Progress reporting period starting 07/06/2021    OT Start Time 1520    OT Stop Time 1600    OT Time Calculation (min) 40 min    Activity Tolerance Patient tolerated treatment well    Behavior During Therapy [facility] for tasks assessed/performed             Past Medical History:  Diagnosis Date   Coronary artery disease    Diabetes mellitus    History of tobacco abuse    quit in 2006 after 5 vessel cabg   Hyperlipidemia    Hypertension    Peripheral vascular disease [facility]) Dec 2012   [place], treated with extensive arterial bypass    Past Surgical History:  Procedure Laterality Date   AORTA - ILIAC ARTERY BYPASS GRAFT  dec 2012   [facility]   CORONARY ARTERY BYPASS GRAFT  2006   [place]    There were no vitals filed for this visit.   Subjective Assessment - 09/22/21 1726     Subjective  Pt. reports having trouble with his night time sleep patterns.    Patient is accompanied by: Family member    Pertinent History Pt. is a 71 y.o. male who was diagnosed with a CVA with right sided weakness. Pt. was admitted to [facility], and received inpatient rehab services. Pt.'s PMHx includes: DM Type II, Diabetic peripheral neuropathy, CAD, HTN, Hyperlipidemia, VTE. Pt. enjoys working out in his yard, and fishing.    Currently in Pain? No/denies            Therapeutic Ex.   Pt. performed AROM for right shoulder flexion, abduction, horizontal adduction, and abduction, forearm supination following moist heat. Pt. worked  on grasping large flat shapes, and moving them through varying heights of vertical dowels with the RUE. Pt. worked with a 3# cuff weight in place when moving them through the 3rd/4th rungs. The Gripper was set to 17.9# of grip strength resistance for the right. Pt. performed resistive EZ Board exercises for forearm supination/pronation, wrist flexion/extension using gross grasp, and lateral pinch (key) grasp. Pt. performed resistive EZ Board exercises angled in several planes to promote shoulder flexion, abduction, and wrist flexion, and extension while performing resistive wrist flexion and extension with a gross grip.      Pt. reports that he had a stomach virus last week that went through his whole family. Pt. presents with limited RUE ROM, strength, motor control, and [facility] skills. Pt. Was able to reach higher with the RUE. Pt. required cues to avoid compensation proximally. Pt. required cues, and visual demonstration for proper technique.  Pt. responded well to there. Ex. Today. Pt. continues to work towards improving right shoulder ROM, strength, motor control, and [facility] skills in order to improve RUE functioning in preparation for ADLs, and IADL tasks.                          OT Education - 09/22/21 1726  Education Details RUE ROM, strength, grip strength, [place]    Person(s) Educated Patient    Methods Explanation    Comprehension Verbalized understanding              OT Short Term Goals - 08/23/21 1549       OT SHORT TERM GOAL #1   Title Pt. will demonstrate independence with a HEP for the RUE.    Baseline Independent with Theraputty exercises. Eval: No current HEP program.    Time 6    Period Weeks    Status Partially Met    Target Date 10/04/21               OT Long Term Goals - 08/23/21 1550       OT LONG TERM GOAL #1   Title Pt. will increase his FOTO score by 2 pts. for clinically significant ADL related change.    Baseline 10th visit: FOTO: 59  Eval: FOTO score: 58, TR: 70    Time 12    Period Weeks    Status New    Target Date 09/28/21      OT LONG TERM GOAL #2   Title Pt. will improve right shoulder flexion by 10 degrees to be able to reach up to access cabinetry, and shelves.    Baseline 10th visit: 125(150) Eval: Shoulder flexion: 111(127)    Time 12    Period Weeks    Status On-going    Target Date 09/28/21      OT LONG TERM GOAL #3   Title Pt. will improve right hand [facility] skills by 5 sec. of speed to assist with buttoning.    Baseline 10th visit: 37 sec. Eval: R: 42 sec., L: 27 sec.    Time 12    Period Weeks    Status On-going    Target Date 09/28/21      OT LONG TERM GOAL #4   Title Pt. will perform light meal prep with modified independence using using work simplification strategies as needed    Baseline Pt. prepared dinner 2 days using the oven, and stovetop using the counter for support seconadry to back pain. Eval: Pt. is unable    Time 12    Period Weeks    Status On-going    Target Date 09/28/21      OT LONG TERM GOAL #5   Title Pt. will independently be able to perfrom self-feeding using his right hand    Baseline 10th visit: Modified independent with an adaptive utensil. with occasionally dropping food. Eval: Pt. has difficulty performing self-feeding with his right hand    Time 12    Period Weeks    Target Date 09/28/21      OT LONG TERM GOAL #6   Title Pt. will perform light homemaking tasks with Modified Independence.    Baseline 10th visit:  independent making the bed.Eval: Pt. is not performing home management tasks.    Time 12    Status On-going    Target Date 09/28/21                   Plan - 09/22/21 1727     Clinical Impression Statement Pt. reports that he had a stomach virus last week that went through his whole family. Pt. presents with limited RUE ROM, strength, motor control, and [facility] skills. Pt. Was able to reach higher with the RUE. Pt. required cues to avoid  compensation proximally. Pt. required cues, and visual demonstration  for proper technique.  Pt. responded well to there. Ex. Today. Pt. continues to work towards improving right shoulder ROM, strength, motor control, and [facility] skills in order to improve RUE functioning in preparation for ADLs, and IADL tasks.       OT Occupational Profile and History Detailed Assessment- Review of Records and additional review of physical, cognitive, psychosocial history related to current functional performance    Occupational performance deficits (Please refer to evaluation for details): IADL's;ADL's;Leisure    Rehab Potential Good    Clinical Decision Making Several treatment options, min-mod task modification necessary    Comorbidities Affecting Occupational Performance: [place] have comorbidities impacting occupational performance    Modification or Assistance to Complete Evaluation  Min-Moderate modification of tasks or assist with assess necessary to complete eval    OT Frequency 2x / week    OT Duration 12 weeks    OT Treatment/Interventions Self-care/ADL training;Therapeutic exercise;Neuromuscular education;Electrical Stimulation;Moist Heat;Passive range of motion;Therapeutic activities;Patient/family education;DME and/or AE instruction;Functional Mobility Training;Energy conservation;Manual Therapy    Consulted and Agree with Plan of Care Patient             Patient will benefit from skilled therapeutic intervention in order to improve the following deficits and impairments:           Visit Diagnosis: Muscle weakness (generalized)  Other lack of coordination    Problem List Patient Active Problem List   Diagnosis Date Noted   Noncompliance with medication treatment due to intermittent use of medication 11/24/2015   Gastritis 11/24/2015   Allergic rhinitis 09/03/2015   GERD (gastroesophageal reflux disease) 09/03/2015   Other vitamin B12 deficiency anemia 09/10/2012   Diabetic  polyneuropathy ([place]) 09/10/2012   Radiculopathy of lumbar region 07/24/2012   History of pulmonary embolism 07/24/2012   Hypertension associated with diabetes ([place]) 04/30/2012   Vasomotor flushing 04/30/2012   Hyperlipidemia with target LDL less than 70 11/16/2011   Diabetes mellitus with atherosclerosis of arteries of extremities ([place]) 11/14/2011   Peripheral vascular disease ([place])    History of tobacco abuse    Coronary artery disease    Harrel Carina, MS, OTR/L   Harrel Carina, OT 09/22/2021, 5:28 PM  [place] MAIN [facility] SERVICES [address] [place], [place], [address] Phone: [phone]   Fax:  [phone]  Name: Johnathan Hernandez MRN: [id number] Date of Birth: September 05, 1950

## 2021-09-27 ENCOUNTER — Ambulatory Visit: Payer: Medicare Other | Admitting: Occupational Therapy

## 2021-09-27 ENCOUNTER — Encounter: Payer: Self-pay | Admitting: Occupational Therapy

## 2021-09-27 ENCOUNTER — Ambulatory Visit: Payer: Medicare Other | Admitting: Physical Therapy

## 2021-09-27 NOTE — Therapy (Signed)
[facility] MAIN [facility] SERVICES [address] [place], [place], [address] Phone: [phone]   Fax:  [phone]  Patient Details  Name: Johnathan Hernandez MRN: [id number] Date of Birth: [birth year] Referring Provider:  No ref. provider found  Encounter Date: 09/27/2021  Pt. did not arrive for his OT/PT appointments this afternoon. An attempt was made to reach out to the pt. via telephone with no response/answer. A general message was left on his telephone voicemail, and the phone number to our clinic was provided.   Olegario Messier, MS, OTR/L   Olegario Messier, OT 09/27/2021, 4:13 PM  [place] [facility] MAIN [facility] SERVICES [address] [place], [place], [address] Phone: [phone]   Fax:  [phone]

## 2021-09-29 ENCOUNTER — Ambulatory Visit: Payer: Medicare Other | Admitting: Occupational Therapy

## 2021-09-29 ENCOUNTER — Ambulatory Visit: Payer: Medicare Other

## 2021-09-29 ENCOUNTER — Encounter: Payer: Self-pay | Admitting: Occupational Therapy

## 2021-09-29 NOTE — Therapy (Signed)
[place] MAIN [facility] SERVICES [address] [place], [place], [address] Phone: [phone]   Fax:  [phone]  Patient Details  Name: Johnathan Hernandez MRN: [id number] Date of Birth: January 22, 1951 Referring Provider:  No ref. provider found  Encounter Date: 09/29/2021  Pt. did not arrive for his OT & PT appointments this afternoon. A call was placed via telephone, with no answer. A general message was left on the voicemail, with the phone number to our therapy clinic.   Harrel Carina, MS, OTR/L   Harrel Carina, OT 09/29/2021, 3:40 PM  [place] MAIN [facility] SERVICES [address] [place], [place], [address] Phone: [phone]   Fax:  [phone]

## 2021-10-04 ENCOUNTER — Ambulatory Visit: Payer: Medicare Other | Admitting: Occupational Therapy

## 2021-10-04 ENCOUNTER — Ambulatory Visit: Payer: Medicare Other | Admitting: Physical Therapy

## 2021-10-05 ENCOUNTER — Telehealth: Payer: Self-pay | Admitting: Physical Therapy

## 2021-10-05 NOTE — Telephone Encounter (Signed)
Called and LVM for patient about missed appt. Reminded of next follow up appt. Asked to call back to office if patient does not wish to return to therapy. Provided office number.   ?

## 2021-10-05 NOTE — Telephone Encounter (Deleted)
Called and LVM for patient about missed appt. Reminded of next follow up appt. Asked to call back to office if patient does not wish to return to therapy. Provided office number.   ?

## 2021-10-06 ENCOUNTER — Ambulatory Visit: Payer: Medicare Other | Admitting: Occupational Therapy

## 2021-10-06 ENCOUNTER — Ambulatory Visit: Payer: Medicare Other | Admitting: Physical Therapy

## 2021-10-11 ENCOUNTER — Encounter: Payer: Medicare Other | Admitting: Occupational Therapy

## 2021-10-13 ENCOUNTER — Ambulatory Visit: Payer: Medicare Other | Admitting: Physical Therapy

## 2021-10-13 ENCOUNTER — Encounter: Payer: Medicare Other | Admitting: Occupational Therapy

## 2021-10-18 ENCOUNTER — Encounter: Payer: Medicare Other | Admitting: Occupational Therapy

## 2021-10-20 ENCOUNTER — Ambulatory Visit: Payer: Medicare Other | Admitting: Physical Therapy

## 2021-10-20 ENCOUNTER — Encounter: Payer: Medicare Other | Admitting: Occupational Therapy

## 2021-10-25 ENCOUNTER — Encounter: Payer: Medicare Other | Admitting: Occupational Therapy

## 2021-10-27 ENCOUNTER — Encounter: Payer: Medicare Other | Admitting: Occupational Therapy

## 2021-11-02 ENCOUNTER — Encounter: Payer: Medicare Other | Admitting: Occupational Therapy

## 2021-11-02 ENCOUNTER — Ambulatory Visit: Payer: Medicare Other | Admitting: Physical Therapy

## 2021-11-04 ENCOUNTER — Encounter: Payer: Medicare Other | Admitting: Occupational Therapy

## 2021-11-09 ENCOUNTER — Encounter: Payer: Medicare Other | Admitting: Occupational Therapy

## 2021-11-09 ENCOUNTER — Ambulatory Visit: Payer: Medicare Other | Admitting: Physical Therapy

## 2021-11-11 ENCOUNTER — Encounter: Payer: Medicare Other | Admitting: Occupational Therapy

## 2021-11-15 ENCOUNTER — Ambulatory Visit: Payer: Medicare Other | Admitting: Physical Therapy

## 2021-11-15 ENCOUNTER — Encounter: Payer: Medicare Other | Admitting: Occupational Therapy

## 2021-11-17 ENCOUNTER — Ambulatory Visit: Payer: Medicare Other | Admitting: Physical Therapy

## 2021-11-17 ENCOUNTER — Encounter: Payer: Medicare Other | Admitting: Occupational Therapy

## 2021-11-22 ENCOUNTER — Ambulatory Visit: Payer: Medicare Other | Admitting: Physical Therapy

## 2021-11-22 ENCOUNTER — Encounter: Payer: Medicare Other | Admitting: Occupational Therapy

## 2021-11-24 ENCOUNTER — Ambulatory Visit: Payer: Medicare Other | Admitting: Physical Therapy

## 2021-11-24 ENCOUNTER — Encounter: Payer: Medicare Other | Admitting: Occupational Therapy

## 2021-11-29 ENCOUNTER — Ambulatory Visit: Payer: Medicare Other | Admitting: Physical Therapy

## 2021-11-29 ENCOUNTER — Encounter: Payer: Medicare Other | Admitting: Occupational Therapy

## 2021-12-01 ENCOUNTER — Encounter: Payer: Medicare Other | Admitting: Occupational Therapy

## 2021-12-01 ENCOUNTER — Ambulatory Visit: Payer: Medicare Other | Admitting: Physical Therapy

## 2021-12-06 ENCOUNTER — Ambulatory Visit: Payer: Medicare Other | Admitting: Physical Therapy

## 2021-12-06 ENCOUNTER — Encounter: Payer: Medicare Other | Admitting: Occupational Therapy

## 2021-12-08 ENCOUNTER — Encounter: Payer: Medicare Other | Admitting: Occupational Therapy

## 2021-12-08 ENCOUNTER — Ambulatory Visit: Payer: Medicare Other | Admitting: Physical Therapy

## 2021-12-13 ENCOUNTER — Ambulatory Visit: Payer: Medicare Other | Admitting: Physical Therapy

## 2021-12-13 ENCOUNTER — Encounter: Payer: Medicare Other | Admitting: Occupational Therapy

## 2021-12-15 ENCOUNTER — Encounter: Payer: Medicare Other | Admitting: Occupational Therapy

## 2021-12-15 ENCOUNTER — Ambulatory Visit: Payer: Medicare Other | Admitting: Physical Therapy

## 2021-12-20 ENCOUNTER — Ambulatory Visit: Payer: Medicare Other | Admitting: Physical Therapy

## 2021-12-20 ENCOUNTER — Encounter: Payer: Medicare Other | Admitting: Occupational Therapy

## 2021-12-22 ENCOUNTER — Ambulatory Visit: Payer: Medicare Other | Admitting: Physical Therapy

## 2021-12-22 ENCOUNTER — Encounter: Payer: Medicare Other | Admitting: Occupational Therapy

## 2021-12-27 ENCOUNTER — Ambulatory Visit: Payer: Medicare Other | Admitting: Physical Therapy

## 2021-12-27 ENCOUNTER — Encounter: Payer: Medicare Other | Admitting: Occupational Therapy

## 2021-12-29 ENCOUNTER — Ambulatory Visit: Payer: Medicare Other | Admitting: Physical Therapy

## 2021-12-29 ENCOUNTER — Encounter: Payer: Medicare Other | Admitting: Occupational Therapy

## 2022-01-03 ENCOUNTER — Ambulatory Visit: Payer: Medicare Other | Admitting: Physical Therapy

## 2022-01-03 ENCOUNTER — Encounter: Payer: Medicare Other | Admitting: Occupational Therapy

## 2022-01-05 ENCOUNTER — Encounter: Payer: Medicare Other | Admitting: Occupational Therapy

## 2022-01-05 ENCOUNTER — Ambulatory Visit: Payer: Medicare Other | Admitting: Physical Therapy

## 2022-01-12 ENCOUNTER — Ambulatory Visit: Payer: Medicare Other | Admitting: Physical Therapy

## 2022-01-12 ENCOUNTER — Encounter: Payer: Medicare Other | Admitting: Occupational Therapy

## 2022-01-17 ENCOUNTER — Encounter: Payer: Medicare Other | Admitting: Occupational Therapy

## 2022-01-17 ENCOUNTER — Ambulatory Visit: Payer: Medicare Other | Admitting: Physical Therapy

## 2022-01-19 ENCOUNTER — Encounter: Payer: Medicare Other | Admitting: Occupational Therapy

## 2022-01-19 ENCOUNTER — Ambulatory Visit: Payer: Medicare Other | Admitting: Physical Therapy

## 2022-01-24 ENCOUNTER — Encounter: Payer: Medicare Other | Admitting: Occupational Therapy

## 2022-01-24 ENCOUNTER — Ambulatory Visit: Payer: Medicare Other | Admitting: Physical Therapy

## 2022-01-26 ENCOUNTER — Ambulatory Visit: Payer: Medicare Other | Admitting: Physical Therapy

## 2022-01-26 ENCOUNTER — Encounter: Payer: Medicare Other | Admitting: Occupational Therapy

## 2022-01-31 ENCOUNTER — Encounter: Payer: Medicare Other | Admitting: Occupational Therapy

## 2022-01-31 ENCOUNTER — Ambulatory Visit: Payer: Medicare Other | Admitting: Physical Therapy

## 2023-02-01 NOTE — Addendum Note (Signed)
Addended by: Thresa Ross B on: 02/01/2023 10:48 AM   Modules accepted: Orders

## 2023-11-14 DIAGNOSIS — Z794 Long term (current) use of insulin: Secondary | ICD-10-CM | POA: Diagnosis not present

## 2023-11-14 DIAGNOSIS — E11618 Type 2 diabetes mellitus with other diabetic arthropathy: Secondary | ICD-10-CM | POA: Diagnosis not present

## 2023-11-14 DIAGNOSIS — I6523 Occlusion and stenosis of bilateral carotid arteries: Secondary | ICD-10-CM | POA: Diagnosis not present

## 2023-11-14 DIAGNOSIS — Z79891 Long term (current) use of opiate analgesic: Secondary | ICD-10-CM | POA: Diagnosis not present

## 2023-11-14 DIAGNOSIS — E1151 Type 2 diabetes mellitus with diabetic peripheral angiopathy without gangrene: Secondary | ICD-10-CM | POA: Diagnosis not present

## 2023-11-14 DIAGNOSIS — M7501 Adhesive capsulitis of right shoulder: Secondary | ICD-10-CM | POA: Diagnosis not present

## 2023-11-14 DIAGNOSIS — M5442 Lumbago with sciatica, left side: Secondary | ICD-10-CM | POA: Diagnosis not present

## 2023-11-14 DIAGNOSIS — E1165 Type 2 diabetes mellitus with hyperglycemia: Secondary | ICD-10-CM | POA: Diagnosis not present

## 2023-11-14 DIAGNOSIS — Z86718 Personal history of other venous thrombosis and embolism: Secondary | ICD-10-CM | POA: Diagnosis not present

## 2023-11-14 DIAGNOSIS — G894 Chronic pain syndrome: Secondary | ICD-10-CM | POA: Diagnosis not present

## 2023-11-14 DIAGNOSIS — K219 Gastro-esophageal reflux disease without esophagitis: Secondary | ICD-10-CM | POA: Diagnosis not present

## 2023-11-14 DIAGNOSIS — E785 Hyperlipidemia, unspecified: Secondary | ICD-10-CM | POA: Diagnosis not present

## 2023-12-01 DIAGNOSIS — M79601 Pain in right arm: Secondary | ICD-10-CM | POA: Diagnosis not present

## 2023-12-01 DIAGNOSIS — I69398 Other sequelae of cerebral infarction: Secondary | ICD-10-CM | POA: Diagnosis not present

## 2023-12-01 DIAGNOSIS — G894 Chronic pain syndrome: Secondary | ICD-10-CM | POA: Diagnosis not present

## 2023-12-11 DIAGNOSIS — Z794 Long term (current) use of insulin: Secondary | ICD-10-CM | POA: Diagnosis not present

## 2023-12-11 DIAGNOSIS — E1165 Type 2 diabetes mellitus with hyperglycemia: Secondary | ICD-10-CM | POA: Diagnosis not present

## 2023-12-15 DIAGNOSIS — M1711 Unilateral primary osteoarthritis, right knee: Secondary | ICD-10-CM | POA: Diagnosis not present

## 2023-12-29 DIAGNOSIS — M5442 Lumbago with sciatica, left side: Secondary | ICD-10-CM | POA: Diagnosis not present

## 2023-12-29 DIAGNOSIS — M47816 Spondylosis without myelopathy or radiculopathy, lumbar region: Secondary | ICD-10-CM | POA: Diagnosis not present

## 2023-12-29 DIAGNOSIS — G8929 Other chronic pain: Secondary | ICD-10-CM | POA: Diagnosis not present

## 2024-01-08 DIAGNOSIS — Z043 Encounter for examination and observation following other accident: Secondary | ICD-10-CM | POA: Diagnosis not present

## 2024-01-08 DIAGNOSIS — M25561 Pain in right knee: Secondary | ICD-10-CM | POA: Diagnosis not present

## 2024-01-08 DIAGNOSIS — Z7901 Long term (current) use of anticoagulants: Secondary | ICD-10-CM | POA: Diagnosis not present

## 2024-01-08 DIAGNOSIS — M25571 Pain in right ankle and joints of right foot: Secondary | ICD-10-CM | POA: Diagnosis not present

## 2024-01-08 DIAGNOSIS — K219 Gastro-esophageal reflux disease without esophagitis: Secondary | ICD-10-CM | POA: Diagnosis not present

## 2024-01-08 DIAGNOSIS — Z86711 Personal history of pulmonary embolism: Secondary | ICD-10-CM | POA: Diagnosis not present

## 2024-01-08 DIAGNOSIS — Z8673 Personal history of transient ischemic attack (TIA), and cerebral infarction without residual deficits: Secondary | ICD-10-CM | POA: Diagnosis not present

## 2024-01-08 DIAGNOSIS — Z888 Allergy status to other drugs, medicaments and biological substances status: Secondary | ICD-10-CM | POA: Diagnosis not present

## 2024-01-08 DIAGNOSIS — M1711 Unilateral primary osteoarthritis, right knee: Secondary | ICD-10-CM | POA: Diagnosis not present

## 2024-01-08 DIAGNOSIS — Z86718 Personal history of other venous thrombosis and embolism: Secondary | ICD-10-CM | POA: Diagnosis not present

## 2024-01-08 DIAGNOSIS — Z886 Allergy status to analgesic agent status: Secondary | ICD-10-CM | POA: Diagnosis not present

## 2024-01-08 DIAGNOSIS — M19071 Primary osteoarthritis, right ankle and foot: Secondary | ICD-10-CM | POA: Diagnosis not present

## 2024-01-08 DIAGNOSIS — Z7982 Long term (current) use of aspirin: Secondary | ICD-10-CM | POA: Diagnosis not present

## 2024-01-08 DIAGNOSIS — I2510 Atherosclerotic heart disease of native coronary artery without angina pectoris: Secondary | ICD-10-CM | POA: Diagnosis not present

## 2024-01-08 DIAGNOSIS — M79604 Pain in right leg: Secondary | ICD-10-CM | POA: Diagnosis not present

## 2024-01-08 DIAGNOSIS — M545 Low back pain, unspecified: Secondary | ICD-10-CM | POA: Diagnosis not present

## 2024-01-08 DIAGNOSIS — M25461 Effusion, right knee: Secondary | ICD-10-CM | POA: Diagnosis not present

## 2024-01-08 DIAGNOSIS — Z885 Allergy status to narcotic agent status: Secondary | ICD-10-CM | POA: Diagnosis not present

## 2024-01-08 DIAGNOSIS — E785 Hyperlipidemia, unspecified: Secondary | ICD-10-CM | POA: Diagnosis not present

## 2024-01-08 DIAGNOSIS — Z79899 Other long term (current) drug therapy: Secondary | ICD-10-CM | POA: Diagnosis not present

## 2024-01-08 DIAGNOSIS — I10 Essential (primary) hypertension: Secondary | ICD-10-CM | POA: Diagnosis not present

## 2024-01-08 DIAGNOSIS — E119 Type 2 diabetes mellitus without complications: Secondary | ICD-10-CM | POA: Diagnosis not present

## 2024-01-09 DIAGNOSIS — M545 Low back pain, unspecified: Secondary | ICD-10-CM | POA: Diagnosis not present

## 2024-01-09 DIAGNOSIS — R7989 Other specified abnormal findings of blood chemistry: Secondary | ICD-10-CM | POA: Diagnosis not present

## 2024-01-09 DIAGNOSIS — Z7982 Long term (current) use of aspirin: Secondary | ICD-10-CM | POA: Diagnosis not present

## 2024-01-09 DIAGNOSIS — Z8673 Personal history of transient ischemic attack (TIA), and cerebral infarction without residual deficits: Secondary | ICD-10-CM | POA: Diagnosis not present

## 2024-01-09 DIAGNOSIS — Z86718 Personal history of other venous thrombosis and embolism: Secondary | ICD-10-CM | POA: Diagnosis not present

## 2024-01-09 DIAGNOSIS — M1990 Unspecified osteoarthritis, unspecified site: Secondary | ICD-10-CM | POA: Diagnosis not present

## 2024-01-09 DIAGNOSIS — Z833 Family history of diabetes mellitus: Secondary | ICD-10-CM | POA: Diagnosis not present

## 2024-01-09 DIAGNOSIS — E119 Type 2 diabetes mellitus without complications: Secondary | ICD-10-CM | POA: Diagnosis not present

## 2024-01-09 DIAGNOSIS — Z801 Family history of malignant neoplasm of trachea, bronchus and lung: Secondary | ICD-10-CM | POA: Diagnosis not present

## 2024-01-09 DIAGNOSIS — Z885 Allergy status to narcotic agent status: Secondary | ICD-10-CM | POA: Diagnosis not present

## 2024-01-09 DIAGNOSIS — Z86711 Personal history of pulmonary embolism: Secondary | ICD-10-CM | POA: Diagnosis not present

## 2024-01-09 DIAGNOSIS — M79662 Pain in left lower leg: Secondary | ICD-10-CM | POA: Diagnosis not present

## 2024-01-09 DIAGNOSIS — Z7984 Long term (current) use of oral hypoglycemic drugs: Secondary | ICD-10-CM | POA: Diagnosis not present

## 2024-01-09 DIAGNOSIS — Z7901 Long term (current) use of anticoagulants: Secondary | ICD-10-CM | POA: Diagnosis not present

## 2024-01-09 DIAGNOSIS — Z809 Family history of malignant neoplasm, unspecified: Secondary | ICD-10-CM | POA: Diagnosis not present

## 2024-01-09 DIAGNOSIS — M79605 Pain in left leg: Secondary | ICD-10-CM | POA: Diagnosis not present

## 2024-01-09 DIAGNOSIS — I2510 Atherosclerotic heart disease of native coronary artery without angina pectoris: Secondary | ICD-10-CM | POA: Diagnosis not present

## 2024-01-09 DIAGNOSIS — K219 Gastro-esophageal reflux disease without esophagitis: Secondary | ICD-10-CM | POA: Diagnosis not present

## 2024-01-09 DIAGNOSIS — Z87891 Personal history of nicotine dependence: Secondary | ICD-10-CM | POA: Diagnosis not present

## 2024-01-09 DIAGNOSIS — I10 Essential (primary) hypertension: Secondary | ICD-10-CM | POA: Diagnosis not present

## 2024-01-11 DIAGNOSIS — N401 Enlarged prostate with lower urinary tract symptoms: Secondary | ICD-10-CM | POA: Diagnosis not present

## 2024-01-11 DIAGNOSIS — E1165 Type 2 diabetes mellitus with hyperglycemia: Secondary | ICD-10-CM | POA: Diagnosis not present

## 2024-01-11 DIAGNOSIS — G8929 Other chronic pain: Secondary | ICD-10-CM | POA: Diagnosis not present

## 2024-01-11 DIAGNOSIS — Z794 Long term (current) use of insulin: Secondary | ICD-10-CM | POA: Diagnosis not present

## 2024-01-11 DIAGNOSIS — M25561 Pain in right knee: Secondary | ICD-10-CM | POA: Diagnosis not present

## 2024-01-11 DIAGNOSIS — M79604 Pain in right leg: Secondary | ICD-10-CM | POA: Diagnosis not present

## 2024-02-04 DIAGNOSIS — I2510 Atherosclerotic heart disease of native coronary artery without angina pectoris: Secondary | ICD-10-CM | POA: Diagnosis not present

## 2024-02-04 DIAGNOSIS — Z7901 Long term (current) use of anticoagulants: Secondary | ICD-10-CM | POA: Diagnosis not present

## 2024-02-04 DIAGNOSIS — I10 Essential (primary) hypertension: Secondary | ICD-10-CM | POA: Diagnosis not present

## 2024-02-04 DIAGNOSIS — K219 Gastro-esophageal reflux disease without esophagitis: Secondary | ICD-10-CM | POA: Diagnosis not present

## 2024-02-04 DIAGNOSIS — E11621 Type 2 diabetes mellitus with foot ulcer: Secondary | ICD-10-CM | POA: Diagnosis not present

## 2024-02-04 DIAGNOSIS — Z79899 Other long term (current) drug therapy: Secondary | ICD-10-CM | POA: Diagnosis not present

## 2024-02-04 DIAGNOSIS — Z86711 Personal history of pulmonary embolism: Secondary | ICD-10-CM | POA: Diagnosis not present

## 2024-02-04 DIAGNOSIS — Z87891 Personal history of nicotine dependence: Secondary | ICD-10-CM | POA: Diagnosis not present

## 2024-02-04 DIAGNOSIS — G8929 Other chronic pain: Secondary | ICD-10-CM | POA: Diagnosis not present

## 2024-02-04 DIAGNOSIS — M549 Dorsalgia, unspecified: Secondary | ICD-10-CM | POA: Diagnosis not present

## 2024-02-04 DIAGNOSIS — M1990 Unspecified osteoarthritis, unspecified site: Secondary | ICD-10-CM | POA: Diagnosis not present

## 2024-02-04 DIAGNOSIS — L97522 Non-pressure chronic ulcer of other part of left foot with fat layer exposed: Secondary | ICD-10-CM | POA: Diagnosis not present

## 2024-02-04 DIAGNOSIS — Z885 Allergy status to narcotic agent status: Secondary | ICD-10-CM | POA: Diagnosis not present

## 2024-02-04 DIAGNOSIS — Z7984 Long term (current) use of oral hypoglycemic drugs: Secondary | ICD-10-CM | POA: Diagnosis not present

## 2024-02-04 DIAGNOSIS — Z794 Long term (current) use of insulin: Secondary | ICD-10-CM | POA: Diagnosis not present

## 2024-02-04 DIAGNOSIS — Z7982 Long term (current) use of aspirin: Secondary | ICD-10-CM | POA: Diagnosis not present

## 2024-02-05 DIAGNOSIS — M6250 Muscle wasting and atrophy, not elsewhere classified, unspecified site: Secondary | ICD-10-CM | POA: Diagnosis not present

## 2024-02-05 DIAGNOSIS — Z7409 Other reduced mobility: Secondary | ICD-10-CM | POA: Diagnosis not present

## 2024-02-05 DIAGNOSIS — N186 End stage renal disease: Secondary | ICD-10-CM | POA: Diagnosis not present

## 2024-02-05 DIAGNOSIS — E1122 Type 2 diabetes mellitus with diabetic chronic kidney disease: Secondary | ICD-10-CM | POA: Diagnosis not present

## 2024-02-05 DIAGNOSIS — R600 Localized edema: Secondary | ICD-10-CM | POA: Diagnosis not present

## 2024-02-05 DIAGNOSIS — L97529 Non-pressure chronic ulcer of other part of left foot with unspecified severity: Secondary | ICD-10-CM | POA: Diagnosis not present

## 2024-02-05 DIAGNOSIS — M19042 Primary osteoarthritis, left hand: Secondary | ICD-10-CM | POA: Diagnosis not present

## 2024-02-05 DIAGNOSIS — L089 Local infection of the skin and subcutaneous tissue, unspecified: Secondary | ICD-10-CM | POA: Diagnosis not present

## 2024-02-05 DIAGNOSIS — E11628 Type 2 diabetes mellitus with other skin complications: Secondary | ICD-10-CM | POA: Diagnosis not present

## 2024-02-05 DIAGNOSIS — N179 Acute kidney failure, unspecified: Secondary | ICD-10-CM | POA: Diagnosis not present

## 2024-02-05 DIAGNOSIS — M79673 Pain in unspecified foot: Secondary | ICD-10-CM | POA: Diagnosis not present

## 2024-02-06 DIAGNOSIS — L97529 Non-pressure chronic ulcer of other part of left foot with unspecified severity: Secondary | ICD-10-CM | POA: Diagnosis not present

## 2024-02-06 DIAGNOSIS — E11621 Type 2 diabetes mellitus with foot ulcer: Secondary | ICD-10-CM | POA: Diagnosis not present

## 2024-02-06 DIAGNOSIS — L0889 Other specified local infections of the skin and subcutaneous tissue: Secondary | ICD-10-CM | POA: Diagnosis not present

## 2024-02-06 DIAGNOSIS — E11628 Type 2 diabetes mellitus with other skin complications: Secondary | ICD-10-CM | POA: Diagnosis not present

## 2024-02-27 DIAGNOSIS — G894 Chronic pain syndrome: Secondary | ICD-10-CM | POA: Diagnosis not present

## 2024-03-04 DIAGNOSIS — Z794 Long term (current) use of insulin: Secondary | ICD-10-CM | POA: Diagnosis not present

## 2024-03-04 DIAGNOSIS — L97529 Non-pressure chronic ulcer of other part of left foot with unspecified severity: Secondary | ICD-10-CM | POA: Diagnosis not present

## 2024-03-04 DIAGNOSIS — E1165 Type 2 diabetes mellitus with hyperglycemia: Secondary | ICD-10-CM | POA: Diagnosis not present

## 2024-03-04 DIAGNOSIS — E11621 Type 2 diabetes mellitus with foot ulcer: Secondary | ICD-10-CM | POA: Diagnosis not present

## 2024-03-12 DIAGNOSIS — M25561 Pain in right knee: Secondary | ICD-10-CM | POA: Diagnosis not present

## 2024-03-12 DIAGNOSIS — G8929 Other chronic pain: Secondary | ICD-10-CM | POA: Diagnosis not present

## 2024-03-12 DIAGNOSIS — G6289 Other specified polyneuropathies: Secondary | ICD-10-CM | POA: Diagnosis not present

## 2024-03-12 DIAGNOSIS — M545 Low back pain, unspecified: Secondary | ICD-10-CM | POA: Diagnosis not present

## 2024-03-22 DIAGNOSIS — E1122 Type 2 diabetes mellitus with diabetic chronic kidney disease: Secondary | ICD-10-CM | POA: Diagnosis not present

## 2024-03-22 DIAGNOSIS — L03032 Cellulitis of left toe: Secondary | ICD-10-CM | POA: Diagnosis not present

## 2024-03-22 DIAGNOSIS — L97529 Non-pressure chronic ulcer of other part of left foot with unspecified severity: Secondary | ICD-10-CM | POA: Diagnosis not present

## 2024-03-22 DIAGNOSIS — Z79891 Long term (current) use of opiate analgesic: Secondary | ICD-10-CM | POA: Diagnosis not present

## 2024-03-22 DIAGNOSIS — E1151 Type 2 diabetes mellitus with diabetic peripheral angiopathy without gangrene: Secondary | ICD-10-CM | POA: Diagnosis not present

## 2024-03-22 DIAGNOSIS — Z7901 Long term (current) use of anticoagulants: Secondary | ICD-10-CM | POA: Diagnosis not present

## 2024-03-22 DIAGNOSIS — N189 Chronic kidney disease, unspecified: Secondary | ICD-10-CM | POA: Diagnosis not present

## 2024-03-22 DIAGNOSIS — D649 Anemia, unspecified: Secondary | ICD-10-CM | POA: Diagnosis not present

## 2024-03-22 DIAGNOSIS — I739 Peripheral vascular disease, unspecified: Secondary | ICD-10-CM | POA: Diagnosis not present

## 2024-03-22 DIAGNOSIS — Z7984 Long term (current) use of oral hypoglycemic drugs: Secondary | ICD-10-CM | POA: Diagnosis not present

## 2024-03-22 DIAGNOSIS — Z7902 Long term (current) use of antithrombotics/antiplatelets: Secondary | ICD-10-CM | POA: Diagnosis not present

## 2024-03-22 DIAGNOSIS — S91102A Unspecified open wound of left great toe without damage to nail, initial encounter: Secondary | ICD-10-CM | POA: Diagnosis not present

## 2024-03-22 DIAGNOSIS — I129 Hypertensive chronic kidney disease with stage 1 through stage 4 chronic kidney disease, or unspecified chronic kidney disease: Secondary | ICD-10-CM | POA: Diagnosis not present

## 2024-03-22 DIAGNOSIS — E1169 Type 2 diabetes mellitus with other specified complication: Secondary | ICD-10-CM | POA: Diagnosis not present

## 2024-03-22 DIAGNOSIS — E11628 Type 2 diabetes mellitus with other skin complications: Secondary | ICD-10-CM | POA: Diagnosis not present

## 2024-03-22 DIAGNOSIS — E11621 Type 2 diabetes mellitus with foot ulcer: Secondary | ICD-10-CM | POA: Diagnosis not present

## 2024-03-22 DIAGNOSIS — L089 Local infection of the skin and subcutaneous tissue, unspecified: Secondary | ICD-10-CM | POA: Diagnosis not present

## 2024-03-22 DIAGNOSIS — I2510 Atherosclerotic heart disease of native coronary artery without angina pectoris: Secondary | ICD-10-CM | POA: Diagnosis not present

## 2024-03-22 DIAGNOSIS — Z8673 Personal history of transient ischemic attack (TIA), and cerebral infarction without residual deficits: Secondary | ICD-10-CM | POA: Diagnosis not present

## 2024-03-22 DIAGNOSIS — M79675 Pain in left toe(s): Secondary | ICD-10-CM | POA: Diagnosis not present

## 2024-03-22 DIAGNOSIS — Z791 Long term (current) use of non-steroidal anti-inflammatories (NSAID): Secondary | ICD-10-CM | POA: Diagnosis not present

## 2024-03-22 DIAGNOSIS — Z794 Long term (current) use of insulin: Secondary | ICD-10-CM | POA: Diagnosis not present

## 2024-03-22 DIAGNOSIS — E1165 Type 2 diabetes mellitus with hyperglycemia: Secondary | ICD-10-CM | POA: Diagnosis not present

## 2024-03-22 DIAGNOSIS — T7840XA Allergy, unspecified, initial encounter: Secondary | ICD-10-CM | POA: Diagnosis not present

## 2024-03-22 DIAGNOSIS — Z951 Presence of aortocoronary bypass graft: Secondary | ICD-10-CM | POA: Diagnosis not present

## 2024-03-22 DIAGNOSIS — R531 Weakness: Secondary | ICD-10-CM | POA: Diagnosis not present

## 2024-03-22 DIAGNOSIS — Z86711 Personal history of pulmonary embolism: Secondary | ICD-10-CM | POA: Diagnosis not present

## 2024-03-22 DIAGNOSIS — Z7982 Long term (current) use of aspirin: Secondary | ICD-10-CM | POA: Diagnosis not present

## 2024-03-23 DIAGNOSIS — S91102A Unspecified open wound of left great toe without damage to nail, initial encounter: Secondary | ICD-10-CM | POA: Diagnosis not present

## 2024-03-23 DIAGNOSIS — M79675 Pain in left toe(s): Secondary | ICD-10-CM | POA: Diagnosis not present

## 2024-03-25 NOTE — Discharge Summary (Signed)
 ------------------------------------------------------------------------------- Attestation signed by Graig Chiquita Norris, MD at 03/25/24 2045 I, Dr. Chiquita FORBES Graig, MD was the attending physician of record. I agree with the findings and the plan as documented in the APP's note.  -------------------------------------------------------------------------------   Physician Discharge Summary [facility] 1 [facility] [place] OBSERVATION [facility] [address] [place] [address] Dept: [phone] Loc: [phone]   Identifying Information:  Johnathan Hernandez Oct 06, 1950 [id number]  Primary Care Physician: Baldwin Alm Lever, MD  Code Status: Full Code  Admit Date: 03/23/2024  Discharge Date: 03/25/2024   Discharge To: Home with Home Health and/or PT/OT  Discharge Service: [facility] - Hospitalist [place] APP   Discharge Attending Physician: Johanne Gong, FNP  Discharge Diagnoses: Principal Problem:   Diabetic foot infection    (POA: Yes) Active Problems:   Lumbar back pain (POA: Yes)   Peripheral neuropathy (POA: Yes)   Peripheral arterial disease with history of revascularization (POA: Not Applicable)   Diabetes mellitus, type 2    (POA: Yes)   Postlaminectomy syndrome, lumbar region (POA: Yes)   Hypertension, benign (POA: Yes)   CAD (coronary artery disease) (POA: Yes)   History of venous thromboembolism (POA: Not Applicable)   History of CVA (cerebrovascular accident) (POA: Not Applicable) Resolved Problems:   * No resolved hospital problems. *   Outpatient Provider Follow Up Issues:  -Weightbearing: HWB in offloading or post op shoe  -Dressings: Vashe cleanse or soak. Vashe wet to dry for now.  -Follow up with wound clinic - either at [facility] or [place]  -Pending anaerobic culture results-at the time of discharge -Follow-up with the vascular surgery clinic  Hospital Course:  Johnathan Hernandez is a 73 y.o. man with HTN, T2DM (on insulin ), CAD s/p CABG (2007), PAD s/p aortobifemoral  bypass (2012) and bilateral femoral endarterectomy (2014), carotid stenosis s/p R ICA stent (2010), hx left thalamic stroke (2022), chronic back pain due to post-laminectomy syndrome (on long-term opioids) who is admitted for infected diabetic foot wound with possible osteomyelitis.   # Diabetic foot infection Lt great toe ulcer:  Illness that poses a threat to life or bodily function without appropriate treatment.  Presented with chronic nonhealing wound to bilateral ankle that has been worsening lately with increased swelling, redness warmth and pain also noted to have purulent drainage.  Patient was recently admitted between 6/23 to 6/24, was evaluated by podiatry at that time and discharged on a course of Augmentin and wound care.  Symptoms improved slightly while on antibiotic then got worse.  Noted obvious signs of purulent cellulitis and wound bed communicates with callus on plantar surface on initial exam.  Initial labs without leukocytosis, mildly elevated inflammatory markers. Xray is negative for osteo. Historically his PAD has been worse on left side than right but he has palpable pedal pulse and brisk capillary refill.  Started broad-spectrum antibiotic with vancomycin, cefepime and Flagyl.  Seen by vascular surgery, PAD with bilateral outflow and runoff obstruction.  Vascular surgery reviewed arterial Doppler study, no acute intervention from vascular surgery standpoint.  Recommend follow-up in clinic. Consulted podiatry, performed wound debridement at bedside on 8/11, aerobic culture sent , results pending at the time of discharge.  Per podiatry, no further imaging required at this time. Continue heel weightbearing in offloading or postop shoes.  Recommend Keflex x 10-day course.  Dressing with Vashe cleanse or soak. Vashe wet to dry for now.  Continued IV antibiotic while inpatient, transition to p.o. Keflex on discharge to complete 10 days course.  Provided offloading shoe  on discharge.  CM  assisted with home health set up prior to discharge.  Recommend close follow-up with vascular surgery clinic for further evaluation and management.  # Peripheral arterial disease with history of revascularization:  US  arterial duplex  with bilateral outflow and runoff obstruction, TP 75/79 . No acute intervention from vascular surgery. Continued aspirin , high-intensity statin while inpatient and at discharge.  Recommend close follow-up with vascular surgery in clinic.   # Chronic normocytic anemia No overt blood loss. Previously treated for B12 deficiency. Current anemia likely confounded by acute infection.  Additional lab works with normal B12.  Iron panel consistent with iron deficiency anemia.  Recommend IV iron infusion as outpatient through PCP office once acute infection has resolved.  Follow-up with the PCP for further evaluation.    # T2DM Last HbA1c 9.1% (01/2024), not well-controlled. Reports symptomatic hypoglycemia at home more recently. Held home oral and non-formulary injectable medications (metformin , glipizide , sitagliptin ).  Managed with insulin  glargine 45 units nightly (decreased from reported dose of 60 units), along with SSI while inpatient. Resumed home regimen at discharge.    # Postlaminectomy syndrome, lumbar region # Peripheral neuropathy Follows with [facility] pain clinic. on multimodal pain control per home regimen. Continued home regimen during stay and at discharge.  Tylenol  q8 hydromorphone  2 mg po tid prn cyclobenzaprine 5 mg qhs prn gabapentin  800 mg tid venalfaxine 150 mg daily lidocaine  patch prn Seen by PT / OT, recommended home health PT/OT to assist with improving functional status.  Case management arranged home health prior to discharge.    # Essential hypertension:  Held home enalapril  initially given positive orthostatic and intermittent dizziness.  Dizziness improved with p.o. intake.  Encouraged p.o. intake throughout the day also educated to change position  slowly especially from lying to sitting or sitting to standing. Resumed home regimen at discharge.    # CAD s/p CABG No details about his CABG or any recent stress testing in available records. LVEF normal at time of stroke in 12/2020. continue aspirin , high-intensity statin.   # CKD sCr variable over past year, baseline appears to be ~1.2 - 1.4 cystatin-C high ( 1.77).  Recommend avoiding nephrotoxins and dose medications for GFR 34.    # History of venous thromboembolism -- continue apixaban    # History of CVA (cerebrovascular accident) Stroke was left thalamic territory. Residual symptoms include right facial weakness, sensory loss in right hand, neuropathic pain on right side -- continue aspirin , statin -- glove on right hand to mitigate peripheral neuropathy  Procedures: None No admission procedures for hospital encounter. ______________________________________________________________________ Discharge Medications:   Your Medication List     START taking these medications    cephalexin 500 MG capsule Commonly known as: KEFLEX Take 1 capsule (500 mg total) by mouth four (4) times a day for 8 days.       CHANGE how you take these medications    insulin  glargine 100 unit/mL (3 mL) injection pen Commonly known as: BASAGLAR , LANTUS  Inject 0.54 mL (54 Units total) under the skin nightly. What changed: how much to take       CONTINUE taking these medications    apixaban  5 mg Tab Commonly known as: ELIQUIS  Take 1 tablet (5 mg total) by mouth two (2) times a day. History of venous thromboembolism.   aspirin  81 MG chewable tablet Chew 1 tablet (81 mg total) daily.   atorvastatin  80 MG tablet Commonly known as: LIPITOR Take 1 tablet (80 mg total) by mouth daily.  blood-glucose meter kit Use as instructed   cilostazol 50 MG tablet Commonly known as: PLETAL Take 2 tablets (100 mg total) by mouth two (2) times a day.   cyclobenzaprine 5 MG tablet Commonly  known as: FLEXERIL Take 1 tablet (5 mg total) by mouth nightly as needed for muscle spasms. Can cause sedation.   DEXCOM G7 RECEIVER Misc Generic drug: blood-glucose,receiver,cont For use with continuous glucose monitoring system.   DEXCOM G7 SENSOR Devi Generic drug: blood-glucose sensor 1 Device by Miscellaneous route every ten (10) days.   diclofenac sodium 1 % gel Commonly known as: VOLTAREN Apply 2 g topically four (4) times a day as needed.   enalapril  5 MG tablet Commonly known as: VASOTEC  Take 1 tablet (5 mg total) by mouth daily.   gabapentin  800 MG tablet Commonly known as: NEURONTIN  Take 1 tablet (800 mg total) by mouth Three (3) times a day.   glipiZIDE  10 MG 24 hr tablet Commonly known as: GLUCOTROL  XL Take 1 tablet (10 mg total) by mouth in the morning.   glipiZIDE  5 MG 24 hr tablet Commonly known as: GLUCOTROL  XL Take 1 tablet (5 mg total) by mouth daily with lunch.   glucose blood test strip Generic drug: blood sugar diagnostic Use to check blood sugar as directed with insulin  3 times a day & for symptoms of high or low blood sugar.   HYDROmorphone  2 MG tablet Commonly known as: DILAUDID  Take 1 tablet (2 mg total) by mouth every eight (8) hours as needed for moderate pain or severe pain. DNF 04/10/2024 Start taking on: April 10, 2024   HYDROmorphone  2 MG tablet Commonly known as: DILAUDID  Take 1 tablet (2 mg total) by mouth every eight (8) hours as needed for moderate pain or severe pain. DNF 05/10/2024 Start taking on: May 10, 2024   insulin  syringe-needle U-100 0.3 mL 31 gauge x 5/16 (8 mm) Syrg Injection Frequency is 1 time per day; Dx Code: Type 2 Diabetes controlled (E11.9)   insulin  syringe-needle U-100 1 mL 31 gauge x 5/16 (8 mm) Syrg Injection Frequency is 1 time per day; Dx Code: Type 2 Diabetes uncontrolled (E11.65)   lidocaine  5 % patch Commonly known as: LIDODERM  Apply to affected area for 12 hours only each day (then remove  patch)   loratadine 10 mg tablet Commonly known as: CLARITIN Take 1 tablet (10 mg total) by mouth in the morning.   naloxone 4 mg/actuation nasal spray Commonly known as: NARCAN One spray in either nostril once for known/suspected opioid overdose. Doten repeat every 2-3 minutes in alternating nostril til EMS arrives   pantoprazole  40 MG tablet Commonly known as: PROTONIX  Take 1 tablet (40 mg total) by mouth daily.   pen needle, diabetic 29 gauge x 1/2 (12 mm) Ndle Use for insulin  injections.   SITagliptin  phosphate 100 MG tablet Commonly known as: JANUVIA  Take 1 tablet (100 mg total) by mouth daily. For diabetes, to lower blood sugar.   TYLENOL  EXTRA STRENGTH 500 MG tablet Generic drug: acetaminophen  Take 2 tablets (1,000 mg total) by mouth every six (6) hours as needed for pain.   venlafaxine  150 MG 24 hr capsule Commonly known as: EFFEXOR -XR Take 1 capsule (150 mg total) by mouth daily.   XYZAL 5 MG tablet Generic drug: levocetirizine Take 1 tablet (5 mg total) by mouth every evening.        Allergies: Oxycodone, Hydrocodone -acetaminophen , Insulin  glargine, Oxycodone-acetaminophen , Duloxetine, and Duloxetine hcl ______________________________________________________________________ Pending Test Results (if blank, then none): Pending  Labs     Order Current Status   Aerobic/Anaerobic Culture In process   Blood Culture #1 Preliminary result   Blood Culture #2 Preliminary result       Most Recent Labs: All lab results last 24 hours -  Recent Results (from the past 24 hours)  POCT Glucose   Collection Time: 03/24/24  8:01 PM  Result Value Ref Range   Glucose, POC 226 (H) 70 - 179 mg/dL  Vancomycin, Trough   Collection Time: 03/25/24  5:17 AM  Result Value Ref Range   Vancomycin Tr 8.6 (L) 10.0 - 20.0 ug/mL  CBC   Collection Time: 03/25/24  5:17 AM  Result Value Ref Range   WBC 8.6 3.6 - 11.2 10*9/L   RBC 4.10 (L) 4.26 - 5.60 10*12/L   HGB 11.0 (L) 12.9 -  16.5 g/dL   HCT 66.0 (L) 60.9 - 51.9 %   MCV 82.6 77.6 - 95.7 fL   MCH 26.7 25.9 - 32.4 pg   MCHC 32.3 32.0 - 36.0 g/dL   RDW 82.9 (H) 87.7 - 84.7 %   MPV 7.8 6.8 - 10.7 fL   Platelet 239 150 - 450 10*9/L  Comprehensive Metabolic Panel   Collection Time: 03/25/24  5:17 AM  Result Value Ref Range   Sodium 142 135 - 145 mmol/L   Potassium 4.5 3.5 - 5.1 mmol/L   Chloride 106 98 - 107 mmol/L   CO2 25.0 20.0 - 31.0 mmol/L   Anion Gap 11 5 - 14 mmol/L   BUN 18 9 - 23 mg/dL   Creatinine 8.93 9.26 - 1.18 mg/dL   BUN/Creatinine Ratio 17    eGFR CKD-EPI (2021) Male 74 >=60 mL/min/1.3m2   Glucose 248 (H) 70 - 179 mg/dL   Calcium  9.0 8.7 - 10.4 mg/dL   Albumin 3.3 (L) 3.4 - 5.0 g/dL   Total Protein 6.9 5.7 - 8.2 g/dL   Total Bilirubin 0.2 (L) 0.3 - 1.2 mg/dL   AST 18 <=65 U/L   ALT 11 10 - 49 U/L   Alkaline Phosphatase 38 (L) 46 - 116 U/L  POCT Glucose   Collection Time: 03/25/24  7:36 AM  Result Value Ref Range   Glucose, POC 237 (H) 70 - 179 mg/dL  POCT Glucose   Collection Time: 03/25/24 11:56 AM  Result Value Ref Range   Glucose, POC 250 (H) 70 - 179 mg/dL  POCT Glucose   Collection Time: 03/25/24  4:53 PM  Result Value Ref Range   Glucose, POC 195 (H) 70 - 179 mg/dL    Relevant Studies/Radiology (if blank, then none):  ______________________________________________________________________ Discharge Instructions:     Follow Up instructions and Outpatient Referrals    Ambulatory Referral to Home Health     Reason for referral: PT/OT   Physician to follow patient's care: PCP   Disciplines requested:  Physical Therapy Occupational Therapy     Physical Therapy requested:  Weight bearing status Evaluate and treat Transfer training     Weight Bearing Status (please provide detail): Lt foot HWB in offloading  shoe   Occupational Therapy Requested:  Strengthening exercises Evaluate and treat ADL or IADL training     Discharge instructions     Discharge instructions        Other Instructions     Discharge instructions     Podiatry Instructions  Dressings Left great toe. Daily to every other day Remove dressing Cleanse the foot and toes and wound with Vashe (or saline or  distilled water) Pour Vashe on gauze and place on wound (for daily dressing changes) If unable to do daily dressing changes, use ALGINATE or Aquacel Ag (cut to size of wound and place on wound) Cover with dry gauze. Cover with ABD.  FOLD UP ABD and place at the first knuckle to offload the great toe - make it bulky to take pressure away from the toe.  Wrap with kerlix then ACE.   WEIGHT BEARING STATUS: Heel weight bearing in offloading shoe.  FOOT WEAR:  If you did not bring home foot wear or if it was out of stock, please obtain foot wear online.  If you are unsure what to get, you can contact the clinic for instructions (manufacture name, type, etc). [phone]  Stretch out the achilles by pulling the ball of the foot towards the knee. Do this several times throughout the day.   To reduce the stress on the wound or incision, elevate your foot to the level of your heart or above.  Do this throughout the day.   When you have a wound or incision on your foot, we do not want the foot to get wet.  If you shower, please place the foot in a waterproof system. Cast Cover  - Can be found at [facility], [facility], [employer] or [employer]. Or you can try a garbage plastic bag and place a rubber band to prevent leaks.   If you have home health coming to your house to help with dressing changes, please bring that information to the clinic when you see your provider.  Please take a picture of your incision or wound every few days so you can monitor the progress.  Do not use hydrogen peroxide or alcohol on the incision or wounds. Do not soak the foot in water. Do not use well or tap water.   Please follow up with a wound clinic provider  [facility] wound clinic*) in 2 weeks from discharge. If an appointment  has not been made at the time of discharge, please call the Wound Clinic at [phone].  If there are any questions or concerns about your Wound or Dressings, please call the wound clinic at [phone] or message your podiatrist via MyChart.  If your wound starts to develop the following , please call the [facility] Wound Clinic for further advice:              Increased drainage, Redness around the wound, Strong odor from the wound when changing the bandages, Increased pain Please do not hesitate to leave a voicemail on the nurse line. We make every effort to return your call the same day or the next day. Please leave a clear message with your name, date of birth, and your medical record number. Leave a brief description of your problem. You Pillard also send a picture through [facility] or through email ([email or web address] to help us  with medical decision making. [facility] should not be used for urgent/emergent situations, but a picture of the wound and dressings removed are helpful. If you are experiencing the following, please call us  for advise or consider going to the nearest local Emergency Department or call 911.              Fever of 100 F, Nausea or Vomitting              Pus draining from your wound, Redness of the whole foot or leg  Severe increase in pain above your baseline. If at ANY time you are concerned, or feel that the wound is worsening and cannot wait to be seen in the wound clinic, please go to your nearest ER or come to [facility] ER.  We expect you to maintain a HEALTHY DIET as part your healing process. Supply your body with the nutrients it needs in order to heal.  -It is recommended to have 25 grams of PROTEIN FOUR TIMES A DAY.  -Limit the amount of processed foods you eat including soda, candy, deli meats, fast foods, etc.   -Please talk to your primary care provider about possible supplementation if your diet is not providing adequate levels  of essential vitamins and minerals. (Magnesium , Zinc, Vitamin C, VItamin D) -Fish Oil is also important for healing. Talk to your primary care about starting DHA/EPA 4 grams a day.  -Increase Fiber to 20 - 30 grams of fiber a day -Talk to your primary care provider about adding a pre-biotic and/or a pro-biotic to your diet during your healing process. A healthy microbiome is essential to feeling better and your healing.   Exercise your body - Recommended 30 min a day.  You do not need to walk to get exercise. You can exercise your upper body with weights or heavy items (water bottle) for 5 minutes at a time. You can also perform leg lifts off the bed 10 times every hour for both legs. This will help reduce the muscle loss because you are not walking as much. Talk to your PHYSICAL THERAPIST OR PCP about other exercises you CAN do.  Your body needs sunlight during the day and darkness at night. Try to get 10 min of sunlight in the morning, 10 min of sunlight in evening, and 8 hours of darkness at night.   IF you are using any TOBACCO, ALCOHOL, Marijuana or other illicit drugs, PLEASE STOP while in the healing process.   PLEASE FOLLOW UP WITH YOUR PRIMARY CARE PROVIDER within 2 weeks of your discharge.  Follow up with your primary care provider is an important part of your overall care.    [facility] [address]  [place], [place] [address] [phone]   Discharge instructions     -Weightbearing: HWB in offloading shoe  -Dressings: Vashe cleanse or soak. Vashe wet to dry for now.  -Follow up with wound clinic - either at [facility] or [place]  -Pending anaerobic culture results-at the time of discharge -Follow-up with the vascular surgery clinic       Appointments which have been scheduled for you    Apr 11, 2024 3:30 PM (Arrive by 3:15 PM) RETURN WOUND with Andrez Beverley Mains, PA [facility] WOUND [address] [place] [facility]  REGION) [address] [place] [address] [phone]     May 23, 2024 2:30 PM (Arrive by 2:00 PM) RETURN  CONSULT with Warren Hummer, FNP [facility] [address] [place] [facility] REGION) [address] [place] [address] [phone]        ______________________________________________________________________ Discharge Day Services: BP 154/70   Pulse 83   Temp 36.9 C (98.4 F) (Oral)   Resp 17   Ht 180.3 cm (5' 11)   Wt 99.5 kg (219 lb 5.7 oz)   SpO2 100%   BMI 30.59 kg/m  Pt seen on the day of discharge and determined appropriate for  discharge.  Condition at Discharge: good  Length of Discharge: I spent greater than 30 mins in the discharge of this patient.

## 2024-03-27 NOTE — Care Plan (Signed)
 Transition of Care Encounter Data   Call attempt: 2 Admission date: 03/23/24 Discharge date: 03/25/24 Discharge diagnosis: Diabetic foot infection Patient post discharge: Medications:      SABRA   [facility]: [phone]:  .  [facility]: [phone]:  .  Other: Contact PCP:                                                     Transitions Case Management MyChart Follow-Up  Case Manager sent letter with hospital follow-up instructions to patient's MyChart.         Darice LITTIE Edison, RN

## 2024-03-29 DIAGNOSIS — Z86718 Personal history of other venous thrombosis and embolism: Secondary | ICD-10-CM | POA: Diagnosis not present

## 2024-03-29 DIAGNOSIS — Z7901 Long term (current) use of anticoagulants: Secondary | ICD-10-CM | POA: Diagnosis not present

## 2024-03-29 DIAGNOSIS — Z8673 Personal history of transient ischemic attack (TIA), and cerebral infarction without residual deficits: Secondary | ICD-10-CM | POA: Diagnosis not present

## 2024-03-29 DIAGNOSIS — M961 Postlaminectomy syndrome, not elsewhere classified: Secondary | ICD-10-CM | POA: Diagnosis not present

## 2024-03-29 DIAGNOSIS — E11621 Type 2 diabetes mellitus with foot ulcer: Secondary | ICD-10-CM | POA: Diagnosis not present

## 2024-03-29 DIAGNOSIS — D631 Anemia in chronic kidney disease: Secondary | ICD-10-CM | POA: Diagnosis not present

## 2024-03-29 DIAGNOSIS — Z792 Long term (current) use of antibiotics: Secondary | ICD-10-CM | POA: Diagnosis not present

## 2024-03-29 DIAGNOSIS — Z556 Problems related to health literacy: Secondary | ICD-10-CM | POA: Diagnosis not present

## 2024-03-29 DIAGNOSIS — I2510 Atherosclerotic heart disease of native coronary artery without angina pectoris: Secondary | ICD-10-CM | POA: Diagnosis not present

## 2024-03-29 DIAGNOSIS — Z7984 Long term (current) use of oral hypoglycemic drugs: Secondary | ICD-10-CM | POA: Diagnosis not present

## 2024-03-29 DIAGNOSIS — Z951 Presence of aortocoronary bypass graft: Secondary | ICD-10-CM | POA: Diagnosis not present

## 2024-03-29 DIAGNOSIS — Z95820 Peripheral vascular angioplasty status with implants and grafts: Secondary | ICD-10-CM | POA: Diagnosis not present

## 2024-03-29 DIAGNOSIS — Z7982 Long term (current) use of aspirin: Secondary | ICD-10-CM | POA: Diagnosis not present

## 2024-04-04 NOTE — Care Plan (Signed)
  Care Management Final Transition Planning Assessment    03/25/2024  Patient's Post Acute Contact Information: [phone] (mobile)   Extended Emergency Contact Information Primary Emergency Contact: [facility] Address: [address]          [place], [place] [address] [place] Home Phone: [phone] Mobile Phone: [phone] Relation: Daughter Secondary Emergency Contact: [facility] Phone: [phone] Relation: Relative [phone] (mobile)       Future Appointments  Date Time Provider Department Center  04/08/2024  3:40 PM Baldwin Alm Lever, MD [facility]  04/11/2024  3:30 PM Sluss, Andrez Jacob, PA [facility]  05/23/2024  2:30 PM Sebastian Pierce, FNP [facility]    Has a specialist appointment been made?: Yes   Post Acute Facility needed at discharge?: No        Home Care/ Home Medical Equipment needed at discharge?: Yes Home Care/ Home Medical Equipment: Home Health (specify)  Home Health Provider (Name/Phone #): [facility]  - Discharged on 03/25/2024 Admission date: 03/23/2024 - Discharge disposition: Home or Assisted Living  with Home Health    Service Provider Services Address Phone Fax Patient Preferred   [facility] [phone], [place] [address] [phone] --                    Currently receiving outpatient dialysis?: No       Discharge Disposition: Home w/ Home Health     IMM Delivery Follow Up Important Message (IM) letter given to patient and/or family?: N/A (The 1st IMM was delivered on 03/24/24.) IMM Delivery Date: 03/24/24            Final Assessment Complete: Yes                     Readmission Risk Score:  Predictive Model Details        14% (Medium)  Factor Value   Calculated 03/25/2024 16:00 30% Number of active inpatient medication orders 35   [facility] Risk  of Unplanned Readmission Model 27% Number of ED visits in last six months 4   *Archived Data 8% Imaging order present in last 6 months    7% Latest hemoglobin low (11.0 g/dL)    7% Age 73    6% Diagnosis of deficiency anemia present    5% Active anticoagulant inpatient medication order present    4% Charlson Comorbidity Index 3    2% Future appointment scheduled    2% Current length of stay 1.222 days    1% Active ulcer inpatient medication order present

## 2024-04-08 DIAGNOSIS — L97529 Non-pressure chronic ulcer of other part of left foot with unspecified severity: Secondary | ICD-10-CM | POA: Diagnosis not present

## 2024-04-08 DIAGNOSIS — Z8673 Personal history of transient ischemic attack (TIA), and cerebral infarction without residual deficits: Secondary | ICD-10-CM | POA: Diagnosis not present

## 2024-04-08 DIAGNOSIS — K219 Gastro-esophageal reflux disease without esophagitis: Secondary | ICD-10-CM | POA: Diagnosis not present

## 2024-04-08 DIAGNOSIS — G894 Chronic pain syndrome: Secondary | ICD-10-CM | POA: Diagnosis not present

## 2024-04-08 DIAGNOSIS — E11618 Type 2 diabetes mellitus with other diabetic arthropathy: Secondary | ICD-10-CM | POA: Diagnosis not present

## 2024-04-08 DIAGNOSIS — E1165 Type 2 diabetes mellitus with hyperglycemia: Secondary | ICD-10-CM | POA: Diagnosis not present

## 2024-04-08 DIAGNOSIS — Z794 Long term (current) use of insulin: Secondary | ICD-10-CM | POA: Diagnosis not present

## 2024-04-08 DIAGNOSIS — M7501 Adhesive capsulitis of right shoulder: Secondary | ICD-10-CM | POA: Diagnosis not present

## 2024-04-12 DIAGNOSIS — Z95820 Peripheral vascular angioplasty status with implants and grafts: Secondary | ICD-10-CM | POA: Diagnosis not present

## 2024-04-12 DIAGNOSIS — E11621 Type 2 diabetes mellitus with foot ulcer: Secondary | ICD-10-CM | POA: Diagnosis not present

## 2024-04-12 DIAGNOSIS — Z556 Problems related to health literacy: Secondary | ICD-10-CM | POA: Diagnosis not present

## 2024-04-12 DIAGNOSIS — M961 Postlaminectomy syndrome, not elsewhere classified: Secondary | ICD-10-CM | POA: Diagnosis not present

## 2024-04-12 DIAGNOSIS — Z86718 Personal history of other venous thrombosis and embolism: Secondary | ICD-10-CM | POA: Diagnosis not present

## 2024-04-12 DIAGNOSIS — D631 Anemia in chronic kidney disease: Secondary | ICD-10-CM | POA: Diagnosis not present

## 2024-04-12 DIAGNOSIS — Z951 Presence of aortocoronary bypass graft: Secondary | ICD-10-CM | POA: Diagnosis not present

## 2024-04-12 DIAGNOSIS — I2510 Atherosclerotic heart disease of native coronary artery without angina pectoris: Secondary | ICD-10-CM | POA: Diagnosis not present

## 2024-04-12 DIAGNOSIS — Z7984 Long term (current) use of oral hypoglycemic drugs: Secondary | ICD-10-CM | POA: Diagnosis not present

## 2024-04-12 DIAGNOSIS — Z7982 Long term (current) use of aspirin: Secondary | ICD-10-CM | POA: Diagnosis not present

## 2024-04-12 DIAGNOSIS — Z792 Long term (current) use of antibiotics: Secondary | ICD-10-CM | POA: Diagnosis not present

## 2024-04-12 DIAGNOSIS — Z8673 Personal history of transient ischemic attack (TIA), and cerebral infarction without residual deficits: Secondary | ICD-10-CM | POA: Diagnosis not present

## 2024-04-12 DIAGNOSIS — Z7901 Long term (current) use of anticoagulants: Secondary | ICD-10-CM | POA: Diagnosis not present

## 2024-04-15 DIAGNOSIS — Z8673 Personal history of transient ischemic attack (TIA), and cerebral infarction without residual deficits: Secondary | ICD-10-CM | POA: Diagnosis not present

## 2024-04-15 DIAGNOSIS — Z95820 Peripheral vascular angioplasty status with implants and grafts: Secondary | ICD-10-CM | POA: Diagnosis not present

## 2024-04-15 DIAGNOSIS — Z86718 Personal history of other venous thrombosis and embolism: Secondary | ICD-10-CM | POA: Diagnosis not present

## 2024-04-15 DIAGNOSIS — Z951 Presence of aortocoronary bypass graft: Secondary | ICD-10-CM | POA: Diagnosis not present

## 2024-04-15 DIAGNOSIS — D631 Anemia in chronic kidney disease: Secondary | ICD-10-CM | POA: Diagnosis not present

## 2024-04-15 DIAGNOSIS — Z792 Long term (current) use of antibiotics: Secondary | ICD-10-CM | POA: Diagnosis not present

## 2024-04-15 DIAGNOSIS — I2510 Atherosclerotic heart disease of native coronary artery without angina pectoris: Secondary | ICD-10-CM | POA: Diagnosis not present

## 2024-04-15 DIAGNOSIS — Z556 Problems related to health literacy: Secondary | ICD-10-CM | POA: Diagnosis not present

## 2024-04-15 DIAGNOSIS — Z7984 Long term (current) use of oral hypoglycemic drugs: Secondary | ICD-10-CM | POA: Diagnosis not present

## 2024-04-15 DIAGNOSIS — Z7901 Long term (current) use of anticoagulants: Secondary | ICD-10-CM | POA: Diagnosis not present

## 2024-04-15 DIAGNOSIS — Z7982 Long term (current) use of aspirin: Secondary | ICD-10-CM | POA: Diagnosis not present

## 2024-04-15 DIAGNOSIS — M961 Postlaminectomy syndrome, not elsewhere classified: Secondary | ICD-10-CM | POA: Diagnosis not present

## 2024-04-15 DIAGNOSIS — E11621 Type 2 diabetes mellitus with foot ulcer: Secondary | ICD-10-CM | POA: Diagnosis not present

## 2024-04-25 DIAGNOSIS — Z86718 Personal history of other venous thrombosis and embolism: Secondary | ICD-10-CM | POA: Diagnosis not present

## 2024-04-25 DIAGNOSIS — Z7984 Long term (current) use of oral hypoglycemic drugs: Secondary | ICD-10-CM | POA: Diagnosis not present

## 2024-04-25 DIAGNOSIS — D631 Anemia in chronic kidney disease: Secondary | ICD-10-CM | POA: Diagnosis not present

## 2024-04-25 DIAGNOSIS — Z7901 Long term (current) use of anticoagulants: Secondary | ICD-10-CM | POA: Diagnosis not present

## 2024-04-25 DIAGNOSIS — Z951 Presence of aortocoronary bypass graft: Secondary | ICD-10-CM | POA: Diagnosis not present

## 2024-04-25 DIAGNOSIS — I2510 Atherosclerotic heart disease of native coronary artery without angina pectoris: Secondary | ICD-10-CM | POA: Diagnosis not present

## 2024-04-25 DIAGNOSIS — M961 Postlaminectomy syndrome, not elsewhere classified: Secondary | ICD-10-CM | POA: Diagnosis not present

## 2024-04-25 DIAGNOSIS — Z556 Problems related to health literacy: Secondary | ICD-10-CM | POA: Diagnosis not present

## 2024-04-25 DIAGNOSIS — E11621 Type 2 diabetes mellitus with foot ulcer: Secondary | ICD-10-CM | POA: Diagnosis not present

## 2024-04-25 DIAGNOSIS — Z7982 Long term (current) use of aspirin: Secondary | ICD-10-CM | POA: Diagnosis not present

## 2024-04-25 DIAGNOSIS — Z792 Long term (current) use of antibiotics: Secondary | ICD-10-CM | POA: Diagnosis not present

## 2024-04-25 DIAGNOSIS — Z95820 Peripheral vascular angioplasty status with implants and grafts: Secondary | ICD-10-CM | POA: Diagnosis not present

## 2024-04-25 DIAGNOSIS — Z8673 Personal history of transient ischemic attack (TIA), and cerebral infarction without residual deficits: Secondary | ICD-10-CM | POA: Diagnosis not present

## 2024-05-01 DIAGNOSIS — Z792 Long term (current) use of antibiotics: Secondary | ICD-10-CM | POA: Diagnosis not present

## 2024-05-01 DIAGNOSIS — Z95820 Peripheral vascular angioplasty status with implants and grafts: Secondary | ICD-10-CM | POA: Diagnosis not present

## 2024-05-01 DIAGNOSIS — Z951 Presence of aortocoronary bypass graft: Secondary | ICD-10-CM | POA: Diagnosis not present

## 2024-05-01 DIAGNOSIS — Z7982 Long term (current) use of aspirin: Secondary | ICD-10-CM | POA: Diagnosis not present

## 2024-05-01 DIAGNOSIS — Z7984 Long term (current) use of oral hypoglycemic drugs: Secondary | ICD-10-CM | POA: Diagnosis not present

## 2024-05-01 DIAGNOSIS — E11621 Type 2 diabetes mellitus with foot ulcer: Secondary | ICD-10-CM | POA: Diagnosis not present

## 2024-05-01 DIAGNOSIS — D631 Anemia in chronic kidney disease: Secondary | ICD-10-CM | POA: Diagnosis not present

## 2024-05-01 DIAGNOSIS — Z556 Problems related to health literacy: Secondary | ICD-10-CM | POA: Diagnosis not present

## 2024-05-01 DIAGNOSIS — Z86718 Personal history of other venous thrombosis and embolism: Secondary | ICD-10-CM | POA: Diagnosis not present

## 2024-05-01 DIAGNOSIS — M961 Postlaminectomy syndrome, not elsewhere classified: Secondary | ICD-10-CM | POA: Diagnosis not present

## 2024-05-01 DIAGNOSIS — Z8673 Personal history of transient ischemic attack (TIA), and cerebral infarction without residual deficits: Secondary | ICD-10-CM | POA: Diagnosis not present

## 2024-05-01 DIAGNOSIS — Z7901 Long term (current) use of anticoagulants: Secondary | ICD-10-CM | POA: Diagnosis not present

## 2024-05-01 DIAGNOSIS — I2510 Atherosclerotic heart disease of native coronary artery without angina pectoris: Secondary | ICD-10-CM | POA: Diagnosis not present

## 2024-05-30 DIAGNOSIS — M79601 Pain in right arm: Secondary | ICD-10-CM | POA: Diagnosis not present

## 2024-05-30 DIAGNOSIS — I69398 Other sequelae of cerebral infarction: Secondary | ICD-10-CM | POA: Diagnosis not present

## 2024-05-30 DIAGNOSIS — M792 Neuralgia and neuritis, unspecified: Secondary | ICD-10-CM | POA: Diagnosis not present

## 2024-05-30 DIAGNOSIS — G894 Chronic pain syndrome: Secondary | ICD-10-CM | POA: Diagnosis not present

## 2024-06-13 NOTE — Telephone Encounter (Signed)
 Patient is requesting the following refill Requested Prescriptions   Pending Prescriptions Disp Refills  . insulin  glargine (BASAGLAR , LANTUS ) 100 unit/mL (3 mL) injection pen 15 mL 12    Sig: Inject 0.54 mL (54 Units total) under the skin nightly.    Recent Visits Date Type Provider Dept  04/08/24 Office Visit Baldwin Alm Lever, MD [facility]  12/11/23 Telemedicine Baldwin, Alm Lever, MD [facility]  11/14/23 Telemedicine Baldwin, Alm Lever, MD [facility]  10/26/23 Telemedicine Baldwin Alm Lever, MD [facility]  10/11/23 Office Visit Baldwin Alm Lever, MD [facility]  09/07/23 Office Visit Baldwin Alm Lever, MD [facility]  06/21/23 Telemedicine Lucienne Chatters, MD [facility]  Showing recent visits within past 365 days and meeting all other requirements Future Appointments Date Type Provider Dept  07/15/24 Appointment Baldwin Alm Lever, MD [facility]  Showing future appointments within next 365 days and meeting all other requirements    Labs: Not applicable this refill

## 2024-06-14 NOTE — Telephone Encounter (Signed)
 Patient is requesting the following refill Requested Prescriptions   Pending Prescriptions Disp Refills  . insulin  glargine (BASAGLAR , LANTUS ) 100 unit/mL (3 mL) injection pen 15 mL 0    Sig: Inject 0.54 mL (54 Units total) under the skin nightly.    Recent Visits Date Type Provider Dept  04/08/24 Office Visit Baldwin Alm Lever, MD [facility]  12/11/23 Telemedicine Baldwin, Alm Lever, MD [facility]  11/14/23 Telemedicine Baldwin, Alm Lever, MD [facility]  10/26/23 Telemedicine Baldwin, Alm Lever, MD [facility]  10/11/23 Office Visit Baldwin Alm Lever, MD [facility]  09/07/23 Office Visit Baldwin Alm Lever, MD [facility]  06/21/23 Telemedicine Lucienne Chatters, MD [facility]  Showing recent visits within past 365 days and meeting all other requirements Future Appointments Date Type Provider Dept  07/15/24 Appointment Baldwin Alm Lever, MD [facility]  Showing future appointments within next 365 days and meeting all other requirements    Labs: A1c:  HGB A1C, POC (%)  Date Value  04/28/2023 7.7 (H)   Hemoglobin A1C (%)  Date Value  04/08/2024 8.5 (A)   CBC:  WBC (10*9/L)  Date Value  03/25/2024 8.6  01/09/2013 8.5   HGB (g/dL)  Date Value  91/88/7974 11.0 (L)  01/09/2013 8.7 (L)   Hgb, blood gas (g/dL)  Date Value  94/80/7985 10.3 (L)   HCT (%)  Date Value  03/25/2024 33.9 (L)  01/09/2013 26.6 (L)   MCV (fL)  Date Value  03/25/2024 82.6  01/09/2013 88   RDW (%)  Date Value  03/25/2024 17.0 (H)  01/09/2013 15.9 (H)   Platelet (10*9/L)  Date Value  03/25/2024 239  01/09/2013 298   Neutrophils % (%)  Date Value  03/24/2024 58.0   Lymphocytes % (%)  Date Value  03/24/2024 24.8   Monocytes % (%)  Date Value  03/24/2024 13.5   Eosinophils % (%)  Date Value  03/24/2024 2.3   Basophils %  (%)  Date Value  03/24/2024 1.4   Creatinine:  Creatinine Whole Blood, POC (mg/dL)  Date Value  90/91/7978 0.9   Creatinine (mg/dL)  Date Value  91/88/7974 1.06

## 2024-06-18 NOTE — Telephone Encounter (Addendum)
 Pt has done Physical Therapy twice since seeing Dr Sallyann in [place] 2025. Pt unable to schedule MRI due to being in the hospital with toe issue.   Pt is ready to schedule MRI, order is in system. Need to find out which provider will see pt for follow up.  His insurance will be out of network beginning in January 2026.   Thanks so much !

## 2024-07-05 ENCOUNTER — Other Ambulatory Visit: Payer: Self-pay

## 2024-07-05 ENCOUNTER — Inpatient Hospital Stay
Admission: EM | Admit: 2024-07-05 | Discharge: 2024-07-08 | DRG: 291 | Disposition: A | Discharge status: Home / Self Care | Payer: Medicare (Managed Care) | Attending: Hospitalist | Admitting: Hospitalist

## 2024-07-05 ENCOUNTER — Inpatient Hospital Stay
Admit: 2024-07-05 | Discharge: 2024-07-05 | Disposition: A | Discharge status: Home / Self Care | Payer: Medicare (Managed Care) | Attending: Osteopathic Medicine | Admitting: Osteopathic Medicine

## 2024-07-05 ENCOUNTER — Emergency Department: Payer: Medicare (Managed Care)

## 2024-07-05 DIAGNOSIS — Z7901 Long term (current) use of anticoagulants: Secondary | ICD-10-CM

## 2024-07-05 DIAGNOSIS — Z8673 Personal history of transient ischemic attack (TIA), and cerebral infarction without residual deficits: Secondary | ICD-10-CM | POA: Diagnosis not present

## 2024-07-05 DIAGNOSIS — E1151 Type 2 diabetes mellitus with diabetic peripheral angiopathy without gangrene: Secondary | ICD-10-CM | POA: Diagnosis not present

## 2024-07-05 DIAGNOSIS — Z87891 Personal history of nicotine dependence: Secondary | ICD-10-CM

## 2024-07-05 DIAGNOSIS — N182 Chronic kidney disease, stage 2 (mild): Secondary | ICD-10-CM | POA: Diagnosis present

## 2024-07-05 DIAGNOSIS — E785 Hyperlipidemia, unspecified: Secondary | ICD-10-CM | POA: Diagnosis present

## 2024-07-05 DIAGNOSIS — I5031 Acute diastolic (congestive) heart failure: Secondary | ICD-10-CM

## 2024-07-05 DIAGNOSIS — I5030 Unspecified diastolic (congestive) heart failure: Secondary | ICD-10-CM | POA: Diagnosis present

## 2024-07-05 DIAGNOSIS — Z951 Presence of aortocoronary bypass graft: Secondary | ICD-10-CM | POA: Diagnosis not present

## 2024-07-05 DIAGNOSIS — R0602 Shortness of breath: Principal | ICD-10-CM

## 2024-07-05 DIAGNOSIS — J441 Chronic obstructive pulmonary disease with (acute) exacerbation: Secondary | ICD-10-CM | POA: Diagnosis not present

## 2024-07-05 DIAGNOSIS — I2510 Atherosclerotic heart disease of native coronary artery without angina pectoris: Secondary | ICD-10-CM | POA: Diagnosis not present

## 2024-07-05 DIAGNOSIS — D631 Anemia in chronic kidney disease: Secondary | ICD-10-CM | POA: Diagnosis present

## 2024-07-05 DIAGNOSIS — Z7984 Long term (current) use of oral hypoglycemic drugs: Secondary | ICD-10-CM | POA: Diagnosis not present

## 2024-07-05 DIAGNOSIS — K219 Gastro-esophageal reflux disease without esophagitis: Secondary | ICD-10-CM | POA: Diagnosis not present

## 2024-07-05 DIAGNOSIS — G894 Chronic pain syndrome: Secondary | ICD-10-CM | POA: Diagnosis present

## 2024-07-05 DIAGNOSIS — E1165 Type 2 diabetes mellitus with hyperglycemia: Secondary | ICD-10-CM | POA: Diagnosis not present

## 2024-07-05 DIAGNOSIS — I5033 Acute on chronic diastolic (congestive) heart failure: Secondary | ICD-10-CM | POA: Diagnosis present

## 2024-07-05 DIAGNOSIS — E1122 Type 2 diabetes mellitus with diabetic chronic kidney disease: Secondary | ICD-10-CM | POA: Diagnosis present

## 2024-07-05 DIAGNOSIS — Z1152 Encounter for screening for COVID-19: Secondary | ICD-10-CM

## 2024-07-05 DIAGNOSIS — D509 Iron deficiency anemia, unspecified: Secondary | ICD-10-CM | POA: Diagnosis not present

## 2024-07-05 DIAGNOSIS — I130 Hypertensive heart and chronic kidney disease with heart failure and stage 1 through stage 4 chronic kidney disease, or unspecified chronic kidney disease: Secondary | ICD-10-CM | POA: Diagnosis not present

## 2024-07-05 DIAGNOSIS — J9601 Acute respiratory failure with hypoxia: Secondary | ICD-10-CM | POA: Diagnosis present

## 2024-07-05 LAB — COMPREHENSIVE METABOLIC PANEL WITH GFR
ALT: 15 U/L (ref 0–44)
AST: 20 U/L (ref 15–41)
Albumin: 4.2 g/dL (ref 3.5–5.0)
Alkaline Phosphatase: 38 U/L (ref 38–126)
Anion gap: 9 (ref 5–15)
BUN: 14 mg/dL (ref 8–23)
CO2: 26 mmol/L (ref 22–32)
Calcium: 9.4 mg/dL (ref 8.9–10.3)
Chloride: 105 mmol/L (ref 98–111)
Creatinine, Ser: 1.11 mg/dL (ref 0.61–1.24)
GFR, Estimated: >60 mL/min (ref >60)
Glucose, Bld: 282 mg/dL — ABNORMAL HIGH (ref 70–99)
Potassium: 4.4 mmol/L (ref 3.5–5.1)
Sodium: 140 mmol/L (ref 135–145)
Total Bilirubin: 0.2 mg/dL (ref 0.0–1.2)
Total Protein: 7.3 g/dL (ref 6.5–8.1)

## 2024-07-05 LAB — CBC WITH DIFFERENTIAL/PLATELET
Abs Immature Granulocytes: 0.05 K/uL (ref 0.00–0.07)
Basophils Absolute: 0.1 K/uL (ref 0.0–0.1)
Basophils Relative: 1 %
Eosinophils Absolute: 0.3 K/uL (ref 0.0–0.5)
Eosinophils Relative: 4 %
HCT: 32.7 % — ABNORMAL LOW (ref 39.0–52.0)
Hemoglobin: 9.6 g/dL — ABNORMAL LOW (ref 13.0–17.0)
Immature Granulocytes: 1 %
Lymphocytes Relative: 17 %
Lymphs Abs: 1.4 K/uL (ref 0.7–4.0)
MCH: 24.6 pg — ABNORMAL LOW (ref 26.0–34.0)
MCHC: 29.4 g/dL — ABNORMAL LOW (ref 30.0–36.0)
MCV: 83.6 fL (ref 80.0–100.0)
Monocytes Absolute: 1 K/uL (ref 0.1–1.0)
Monocytes Relative: 11 %
Neutro Abs: 5.5 K/uL (ref 1.7–7.7)
Neutrophils Relative %: 66 %
Platelets: 255 K/uL (ref 150–400)
RBC: 3.91 MIL/uL — ABNORMAL LOW (ref 4.22–5.81)
RDW: 15.4 % (ref 11.5–15.5)
WBC: 8.3 K/uL (ref 4.0–10.5)
nRBC: 0 % (ref 0.0–0.2)

## 2024-07-05 LAB — IRON AND TIBC
Iron: 23 ug/dL — ABNORMAL LOW (ref 45–182)
Saturation Ratios: 5 % — ABNORMAL LOW (ref 17.9–39.5)
TIBC: 491 ug/dL — ABNORMAL HIGH (ref 250–450)
UIBC: 468 ug/dL

## 2024-07-05 LAB — ECHOCARDIOGRAM COMPLETE
AR max vel: 3.01 cm2
AV Area VTI: 2.08 cm2
AV Area mean vel: 2.72 cm2
AV Mean grad: 3 mmHg
AV Peak grad: 4.4 mmHg
Ao pk vel: 1.05 m/s
Area-P 1/2: 7.51 cm2
Height: 71 in
S' Lateral: 2.9 cm
Weight: 3798.4 [oz_av]

## 2024-07-05 LAB — RESP PANEL BY RT-PCR (RSV, FLU A&B, COVID)  RVPGX2
Influenza A by PCR: NEGATIVE
Influenza B by PCR: NEGATIVE
Resp Syncytial Virus by PCR: NEGATIVE
SARS Coronavirus 2 by RT PCR: NEGATIVE

## 2024-07-05 LAB — GLUCOSE, CAPILLARY
Glucose-Capillary: 342 mg/dL — ABNORMAL HIGH (ref 70–99)
Glucose-Capillary: 251 mg/dL — ABNORMAL HIGH (ref 70–99)

## 2024-07-05 LAB — CBG MONITORING, ED: Glucose-Capillary: 291 mg/dL — ABNORMAL HIGH (ref 70–99)

## 2024-07-05 LAB — HEMOGLOBIN A1C
Hgb A1c MFr Bld: 9.5 % — ABNORMAL HIGH (ref 4.8–5.6)
Mean Plasma Glucose: 225.95 mg/dL

## 2024-07-05 LAB — PRO BRAIN NATRIURETIC PEPTIDE: Pro Brain Natriuretic Peptide: 454 pg/mL — ABNORMAL HIGH (ref <300.0)

## 2024-07-05 LAB — FERRITIN: Ferritin: 17 ng/mL — ABNORMAL LOW (ref 24–336)

## 2024-07-05 LAB — MAGNESIUM: Magnesium: 2.1 mg/dL (ref 1.7–2.4)

## 2024-07-05 LAB — TROPONIN T, HIGH SENSITIVITY
Troponin T High Sensitivity: 38 ng/L — ABNORMAL HIGH (ref 0–19)
Troponin T High Sensitivity: 41 ng/L — ABNORMAL HIGH (ref 0–19)

## 2024-07-05 MED ORDER — ALBUTEROL SULFATE (2.5 MG/3ML) 0.083% IN NEBU
2.5000 mg | INHALATION_SOLUTION | RESPIRATORY_TRACT | Status: DC | PRN
Start: 2024-07-05 — End: 2024-07-08

## 2024-07-05 MED ORDER — APIXABAN 5 MG PO TABS
5.0000 mg | ORAL_TABLET | Freq: Two times a day (BID) | ORAL | Status: DC
  Administered 2024-07-05 – 2024-07-08 (×7): 5 mg via ORAL
  Filled 2024-07-05 (×7): qty 1

## 2024-07-05 MED ORDER — IPRATROPIUM-ALBUTEROL 0.5-2.5 (3) MG/3ML IN SOLN
6.0000 mL | Freq: Once | RESPIRATORY_TRACT | Status: AC
  Administered 2024-07-05: 6 mL via RESPIRATORY_TRACT
  Filled 2024-07-05: qty 6

## 2024-07-05 MED ORDER — GABAPENTIN 400 MG PO CAPS
1200.0000 mg | ORAL_CAPSULE | Freq: Three times a day (TID) | ORAL | Status: DC
  Administered 2024-07-05 (×2): 1200 mg via ORAL
  Filled 2024-07-05 (×3): qty 3

## 2024-07-05 MED ORDER — ENALAPRIL MALEATE 2.5 MG PO TABS
5.0000 mg | ORAL_TABLET | Freq: Every day | ORAL | Status: DC
  Administered 2024-07-05 – 2024-07-08 (×4): 5 mg via ORAL
  Filled 2024-07-05: qty 1
  Filled 2024-07-05 (×3): qty 2
  Filled 2024-07-05: qty 1
  Filled 2024-07-05: qty 2

## 2024-07-05 MED ORDER — PREDNISONE 20 MG PO TABS
40.0000 mg | ORAL_TABLET | Freq: Every day | ORAL | Status: DC
  Administered 2024-07-06 – 2024-07-08 (×3): 40 mg via ORAL
  Filled 2024-07-05 (×3): qty 2

## 2024-07-05 MED ORDER — INSULIN ASPART 100 UNIT/ML IJ SOLN
0.0000 [IU] | Freq: Three times a day (TID) | INTRAMUSCULAR | Status: DC
  Administered 2024-07-05: 8 [IU] via SUBCUTANEOUS
  Administered 2024-07-05: 11 [IU] via SUBCUTANEOUS
  Administered 2024-07-06: 5 [IU] via SUBCUTANEOUS
  Administered 2024-07-06: 8 [IU] via SUBCUTANEOUS
  Administered 2024-07-06 – 2024-07-07 (×2): 11 [IU] via SUBCUTANEOUS
  Administered 2024-07-07: 2 [IU] via SUBCUTANEOUS
  Administered 2024-07-08 (×2): 3 [IU] via SUBCUTANEOUS
  Filled 2024-07-05: qty 11
  Filled 2024-07-05: qty 3
  Filled 2024-07-05: qty 2
  Filled 2024-07-05: qty 11
  Filled 2024-07-05 (×2): qty 8
  Filled 2024-07-05: qty 5
  Filled 2024-07-05: qty 11
  Filled 2024-07-05: qty 3

## 2024-07-05 MED ORDER — ATORVASTATIN CALCIUM 20 MG PO TABS
80.0000 mg | ORAL_TABLET | Freq: Every day | ORAL | Status: DC
  Administered 2024-07-05 – 2024-07-08 (×4): 80 mg via ORAL
  Filled 2024-07-05 (×4): qty 4

## 2024-07-05 MED ORDER — EMPAGLIFLOZIN 10 MG PO TABS
10.0000 mg | ORAL_TABLET | Freq: Every day | ORAL | Status: DC
  Administered 2024-07-06 – 2024-07-08 (×3): 10 mg via ORAL
  Filled 2024-07-05 (×3): qty 1

## 2024-07-05 MED ORDER — BISACODYL 10 MG RE SUPP: 10.0000 mg | Freq: Every day | RECTAL | Status: DC | PRN

## 2024-07-05 MED ORDER — METHYLPREDNISOLONE SODIUM SUCC 125 MG IJ SOLR
125.0000 mg | Freq: Once | INTRAMUSCULAR | Status: AC
  Administered 2024-07-05: 125 mg via INTRAVENOUS
  Filled 2024-07-05: qty 2

## 2024-07-05 MED ORDER — HYDRALAZINE HCL 20 MG/ML IJ SOLN
5.0000 mg | INTRAMUSCULAR | Status: DC | PRN
  Administered 2024-07-08: 5 mg via INTRAVENOUS
  Filled 2024-07-05: qty 1

## 2024-07-05 MED ORDER — ONDANSETRON HCL 4 MG PO TABS: 4.0000 mg | ORAL_TABLET | Freq: Four times a day (QID) | ORAL | Status: DC | PRN

## 2024-07-05 MED ORDER — DOCUSATE SODIUM 100 MG PO CAPS: 100.0000 mg | ORAL_CAPSULE | Freq: Two times a day (BID) | ORAL | Status: DC

## 2024-07-05 MED ORDER — POLYETHYLENE GLYCOL 3350 17 G PO PACK
17.0000 g | PACK | Freq: Every day | ORAL | Status: DC | PRN
Start: 2024-07-05 — End: 2024-07-08

## 2024-07-05 MED ORDER — LIDOCAINE 5 % EX PTCH
2.0000 | MEDICATED_PATCH | CUTANEOUS | Status: DC
  Administered 2024-07-05 – 2024-07-07 (×3): 2 via TRANSDERMAL
  Filled 2024-07-05 (×4): qty 2

## 2024-07-05 MED ORDER — FUROSEMIDE 10 MG/ML IJ SOLN
40.0000 mg | Freq: Two times a day (BID) | INTRAMUSCULAR | Status: DC
  Administered 2024-07-05 – 2024-07-06 (×2): 40 mg via INTRAVENOUS
  Filled 2024-07-05 (×2): qty 4

## 2024-07-05 MED ORDER — PANTOPRAZOLE SODIUM 40 MG PO TBEC
40.0000 mg | DELAYED_RELEASE_TABLET | Freq: Every day | ORAL | Status: DC
  Administered 2024-07-05 – 2024-07-08 (×4): 40 mg via ORAL
  Filled 2024-07-05 (×4): qty 1

## 2024-07-05 MED ORDER — VENLAFAXINE HCL ER 75 MG PO CP24
150.0000 mg | ORAL_CAPSULE | Freq: Every day | ORAL | Status: DC
  Administered 2024-07-05 – 2024-07-08 (×4): 150 mg via ORAL
  Filled 2024-07-05 (×4): qty 2

## 2024-07-05 MED ORDER — HYDROMORPHONE HCL 2 MG PO TABS
2.0000 mg | ORAL_TABLET | Freq: Three times a day (TID) | ORAL | Status: DC
  Administered 2024-07-05 – 2024-07-08 (×11): 2 mg via ORAL
  Filled 2024-07-05 (×11): qty 1

## 2024-07-05 MED ORDER — IPRATROPIUM-ALBUTEROL 0.5-2.5 (3) MG/3ML IN SOLN
3.0000 mL | Freq: Four times a day (QID) | RESPIRATORY_TRACT | Status: DC
  Administered 2024-07-05 – 2024-07-06 (×5): 3 mL via RESPIRATORY_TRACT
  Filled 2024-07-05 (×5): qty 3

## 2024-07-05 MED ORDER — ASPIRIN 81 MG PO TBEC
81.0000 mg | DELAYED_RELEASE_TABLET | Freq: Once | ORAL | Status: AC
  Administered 2024-07-05: 81 mg via ORAL
  Filled 2024-07-05: qty 1

## 2024-07-05 MED ORDER — FUROSEMIDE 10 MG/ML IJ SOLN
60.0000 mg | Freq: Once | INTRAMUSCULAR | Status: AC
  Administered 2024-07-05: 60 mg via INTRAVENOUS
  Filled 2024-07-05: qty 8

## 2024-07-05 MED ORDER — ONDANSETRON HCL 4 MG/2ML IJ SOLN: 4.0000 mg | Freq: Four times a day (QID) | INTRAMUSCULAR | Status: DC | PRN

## 2024-07-05 MED ORDER — INSULIN ASPART 100 UNIT/ML IJ SOLN
0.0000 [IU] | Freq: Every day | INTRAMUSCULAR | Status: DC
  Administered 2024-07-05: 3 [IU] via SUBCUTANEOUS
  Administered 2024-07-06 – 2024-07-07 (×2): 4 [IU] via SUBCUTANEOUS
  Filled 2024-07-05: qty 4
  Filled 2024-07-05: qty 3
  Filled 2024-07-05: qty 4

## 2024-07-05 MED ORDER — INSULIN GLARGINE-YFGN 100 UNIT/ML ~~LOC~~ SOLN
50.0000 [IU] | Freq: Every day | SUBCUTANEOUS | Status: DC
  Administered 2024-07-05: 50 [IU] via SUBCUTANEOUS
  Filled 2024-07-05 (×2): qty 0.5

## 2024-07-05 NOTE — ED Notes (Addendum)
 Pt advising he wants to go home. He said he is tired of being here in a dungeon with no windows and no phones. Provider made aware.

## 2024-07-05 NOTE — Progress Notes (Signed)
*  PRELIMINARY RESULTS* Echocardiogram 2D Echocardiogram has been performed.  Johnathan Hernandez 07/05/2024, 2:37 PM

## 2024-07-05 NOTE — ED Notes (Signed)
Pt getting echo at this time.

## 2024-07-05 NOTE — H&P (Addendum)
 HISTORY AND PHYSICAL    [name]   FMW:[id number] DOB: October 05, 1950   Date of Service: 07/05/24 Requesting physician/APP from ED: Treatment Team:  Attending Provider: Marsa Edelman, DO  PCP: Baldwin Lenis, MD    Johnathan Hernandez is a 73 y.o. male w/ PMH as follows;  Cardiac: CAD s/p CABG, Echo 2022 diastolic dysfunction and preserved EF, HTN, HLD, PVD/PAD w/ hx revascularization, on Eliquis  + ASA Neuro: hx TIA and CVA w/o residual  Misc: DM2 on long acting insulin , CKD noted on hx but baseline Cr 1.0 and GFR >60, anemia d/t CKD but question this in setting of no severe renal disease, hx VTE on eliquis  + ASA, GERD, Anx/Dep Chronic pain / neuropathic pain post-CVA and post-laminectomy, managed by pain clinic [facility] last visit 10/16 started Nucynta 50 mg tid prn also on gabapentin , tylenol , flexeril, voltaren, lidoderm , SNRI Hospitalizations this year for diabetic foot wound L - was hospitalized 01/2024 and 03/2024, s/p wound debridement and to f/u vascaular outpatient   He presents to the ED today 07/05/24 w/ chief complaint  Chief Complaint  Patient presents with   Shortness of Breath       HPI:   Ongoing and worsening dyspnea on exertion. Characterized by SOB w/ ambulation. Associated with LE edema. Had 1 episode loose stool day prior to admission. No other sick contacts. Remote hx smoking, quit 2006, no previous dx COPD/Asthma.    ED eval/tx:   In ED here, mild tachycardia, 100%RA, workup revealed Hgb 9.6, hyperglycemia, clear CXR but anasarca on exam, Pro-BNP >400, troponin 38, EKG no STEMI. Treated with lasix  60 mg IV x1, DuoNeb, SoluMedrop 125 mg. Hospitalist consulted for admission.    Consultants:  none  Procedures: none      ASSESSMENT & PLAN:   Shortness of breath  COPD CHF uncertain type, hx diastolic dysfunction  Treat underlying cause(s) as below   Question COPD, he does not have formal diagnosis Remote hx Tobacco  Duonebs q6h while  awake Albuterol  q2h prn  Continue po prednisone  tomorrow (got 1 dose VI steroid in ED) O2 prn  Pulse ox w/ VS ok, does not need to be continuous   Acute on chronic CHF / HFpEF question new systolic component  CAD s/p CABG HTN HLD hx VTE on eliquis  + ASA PVD/PAD w/ hx revascularization, on Eliquis  + ASA Echo 202 diastolic dysfunction and preserved EF,  Diuresis - Lasix  60 mg x1 in ED, will conitnue w/ Lasix  40 mg IV bid Echo  Strict I&O, daily weight, fluid restriction  GDMT as BP allows on diuresis - will at minimum continue home jardiance , enalapril . ASA, statin, Eliquis ,  Unsure why not on beta blocker, await echo but anticipate starting this  Hydralazine  prn SBP >160  Anemia  Records report anemia d/t CKD but no severe renal diseaes Iron, TIBC, Ferritin   hx TIA and CVA w/o residual deficits but does have residual neuropathic pain Pain control as below  DM2 on long acting insulin  and po meds Hold po meds glipizide , metformin  Continue long acting insulin  here at 50 units daily (he takes 65 at home) + sliding scale achs Titrate as needed No active foot ulceration - follow outpatient   CKD noted on hx but baseline Cr 1.0 and GFR >60, anemia d/t CKD but question this in setting of no severe renal disease Monitor BMP  GERD Continue home PPI  Anx/Dep Continue home SSRI  Chronic pain / neuropathic pain post-CVA and post-laminectomy managed by pain clinic  [facility] last visit 10/16 started Nucynta 50 mg tid but recently switched back to hydromorphone , he is also on gabapentin , tylenol , flexeril, voltaren, lidoderm , SNRI PDMP reviewed Continue home gabapentin , hydromorphone    Advanced care planning See CODE STATUS below for conversation about this        DVT prophylaxis: eliquis  Pertinent IV fluids/nutrition: no iv fluids, ok for cardiac/carb diet w/ fluid restriction currently at 1800 ml/day Central lines / invasive devices: none  Code Status: FULL CODE - pt is okay  for CPR and intubation but he notes that he would NOT want to be kept on life support indefinitely. Daughter is present for this conversation. He states he would let his doctors determine if there was no hope and then would discontinue life support measures if this was the case.  Family Communication: daughter is at bedside on armission  Disposition: inpatient, since no O2 requirement and not severely SOB/overloaded I have ordered for telemetry and NOT progressive unit TOC needs: TBD Barriers to discharge / significant pending items: diuresing, expect medically ready in few days / pending Echo and response to diuresis / nebs                   Review of Systems:  Review of Systems  Constitutional:  Positive for malaise/fatigue. Negative for chills, diaphoresis, fever and weight loss.  HENT:  Negative for hearing loss, sinus pain and sore throat.   Eyes:  Negative for blurred vision.  Respiratory:  Positive for shortness of breath. Negative for cough and sputum production.   Cardiovascular:  Positive for orthopnea, leg swelling and PND. Negative for chest pain and claudication.  Gastrointestinal:  Positive for diarrhea. Negative for abdominal pain, blood in stool, constipation, heartburn, melena and nausea.  Genitourinary:  Negative for dysuria, frequency and urgency.  Neurological:  Positive for weakness. Negative for dizziness, speech change, focal weakness, seizures and loss of consciousness.  Psychiatric/Behavioral:  Negative for depression.        has a past medical history of Coronary artery disease, Diabetes mellitus, History of tobacco abuse, Hyperlipidemia, Hypertension, and Peripheral vascular disease (Dec 2012).  No current facility-administered medications on file prior to encounter.   Current Outpatient Medications on File Prior to Encounter  Medication Sig Dispense Refill   aspirin  EC 81 MG tablet Take 81 mg by mouth once.     atorvastatin  (LIPITOR) 80 MG  tablet TAKE 1 TABLET BY MOUTH ONCE DAILY 90 tablet 0   Cholecalciferol (VITAMIN D3) 2000 UNITS TABS Take by mouth.     docusate sodium  (STOOL SOFTENER) 100 MG capsule Take 100 mg by mouth once.     empagliflozin  (JARDIANCE ) 10 MG TABS tablet Take 10 mg by mouth daily. 30 tablet 5   enalapril  (VASOTEC ) 5 MG tablet TAKE 1 TABLET BY MOUTH ONCE DAILY 90 tablet 0   enalapril  (VASOTEC ) 5 MG tablet TAKE 1 TABLET BY MOUTH ONCE DAILY 30 tablet 0   ferrous sulfate 325 (65 FE) MG tablet Take 325 mg by mouth daily with breakfast. Reported on 11/23/2015     fish oil-omega-3 fatty acids 1000 MG capsule Take 2 g by mouth daily.     fluticasone  (FLONASE ) 50 MCG/ACT nasal spray USE TWO SPRAY(S) IN EACH NOSTRIL ONCE DAILY 16 g 6   gabapentin  (NEURONTIN ) 600 MG tablet Take 1,200 mg by mouth 3 (three) times daily.     glipiZIDE  (GLUCOTROL ) 10 MG tablet TAKE 1 TABLET BY MOUTH TWICE DAILY BEFORE MEALS 30 tablet 0   glucose  monitoring kit (FREESTYLE) monitoring kit 1 each by Does not apply route as needed for other. PATIENT TO CHOOSE THE GLUCOMETER OF HIS CHOICE 1 each 1   HYDROmorphone  (DILAUDID ) 8 MG tablet Take 8 mg by mouth 3 (three) times daily.     metFORMIN  (GLUCOPHAGE ) 1000 MG tablet TAKE ONE TABLET BY MOUTH TWICE DAILY WITH MEALS 60 tablet 5   metFORMIN  (GLUCOPHAGE ) 500 MG tablet TAKE 1 TABLET BY MOUTH TWICE DAILY WITH MEALS 180 tablet 1   Multiple Vitamin (MULTIVITAMIN) tablet Take 1 tablet by mouth daily.     Omeprazole (RA OMEPRAZOLE) 20 MG TBEC Take 40 mg by mouth once.     vitamin B-12 (CYANOCOBALAMIN) 1000 MCG tablet Take 1,000 mcg by mouth daily.     XARELTO  20 MG TABS tablet TAKE 1 TABLET BY MOUTH ONCE DAILY 30 tablet 0     Allergies  Allergen Reactions   Percocet [Oxycodone-Acetaminophen ]    Vicodin [Hydrocodone -Acetaminophen ]    Duloxetine Hcl Anxiety    Seating, anxiety, increased heart rate    reports that he quit smoking about 19 years ago. He has never used smokeless tobacco. He reports  that he does not drink alcohol and does not use drugs.   family history includes Cancer in his father; Diabetes in his mother and sister; Gastric cancer (age of onset: 82) in his brother; Heart disease in his mother.   Past Surgical History:  Procedure Laterality Date   AORTA - ILIAC ARTERY BYPASS GRAFT  dec 2012   [facility]   CORONARY ARTERY BYPASS GRAFT  2006   [facility]          Objective Findings:  Vitals:   07/05/24 0743 07/05/24 0745  BP: (!) 165/84   Pulse: (!) 104   Resp: 17   Temp: 98.2 F (36.8 C)   TempSrc: Oral   SpO2: 100%   Weight:  107.7 kg  Height:  5' 11 (1.803 m)   No intake or output data in the 24 hours ending 07/05/24 1034 Filed Weights   07/05/24 0745  Weight: 107.7 kg    Examination:  Physical Exam Constitutional:      General: He is not in acute distress. Cardiovascular:     Rate and Rhythm: Regular rhythm. Tachycardia present.     Heart sounds: Normal heart sounds.  Pulmonary:     Effort: Pulmonary effort is normal.     Breath sounds: Examination of the right-lower field reveals rales. Examination of the left-lower field reveals rales. Rales present. No wheezing.  Musculoskeletal:     Right lower leg: Edema (+1) present.     Left lower leg: Edema (+1) present.  Neurological:     General: No focal deficit present.     Mental Status: He is alert and oriented to person, place, and time.  Psychiatric:        Mood and Affect: Mood normal.        Behavior: Behavior normal.          Scheduled Medications:   docusate sodium   100 mg Oral BID   furosemide   40 mg Intravenous BID   insulin  aspart  0-15 Units Subcutaneous TID WC   insulin  aspart  0-5 Units Subcutaneous QHS   ipratropium-albuterol   3 mL Nebulization Q6H WA   [START ON 07/06/2024] predniSONE   40 mg Oral Q breakfast    Continuous Infusions:   PRN Medications:  albuterol , bisacodyl , hydrALAZINE , ondansetron  **OR** ondansetron  (ZOFRAN ) IV, polyethylene  glycol  Antimicrobials:  Anti-infectives (From  admission, onward)    None           Data Reviewed: I have personally reviewed following labs and imaging studies  CBC: Recent Labs  Lab 07/05/24 0750  WBC 8.3  NEUTROABS 5.5  HGB 9.6*  HCT 32.7*  MCV 83.6  PLT 255   Basic Metabolic Panel: Recent Labs  Lab 07/05/24 0750  NA 140  K 4.4  CL 105  CO2 26  GLUCOSE 282*  BUN 14  CREATININE 1.11  CALCIUM  9.4  MG 2.1   GFR: Estimated Creatinine Clearance: 71.8 mL/min (by C-G formula based on SCr of 1.11 mg/dL). Liver Function Tests: Recent Labs  Lab 07/05/24 0750  AST 20  ALT 15  ALKPHOS 38  BILITOT 0.2  PROT 7.3  ALBUMIN 4.2   No results for input(s): LIPASE, AMYLASE in the last 168 hours. No results for input(s): AMMONIA in the last 168 hours. Coagulation Profile: No results for input(s): INR, PROTIME in the last 168 hours. Cardiac Enzymes: No results for input(s): CKTOTAL, CKMB, CKMBINDEX, TROPONINI in the last 168 hours. BNP (last 3 results) Recent Labs    07/05/24 0750  PROBNP 454.0*   HbA1C: No results for input(s): HGBA1C in the last 72 hours. CBG: No results for input(s): GLUCAP in the last 168 hours. Lipid Profile: No results for input(s): CHOL, HDL, LDLCALC, TRIG, CHOLHDL, LDLDIRECT in the last 72 hours. Thyroid Function Tests: No results for input(s): TSH, T4TOTAL, FREET4, T3FREE, THYROIDAB in the last 72 hours. Anemia Panel: No results for input(s): VITAMINB12, FOLATE, FERRITIN, TIBC, IRON, RETICCTPCT in the last 72 hours. Most Recent Urinalysis On File:  No results found for: COLORURINE, APPEARANCEUR, LABSPEC, PHURINE, GLUCOSEU, HGBUR, BILIRUBINUR, KETONESUR, PROTEINUR, UROBILINOGEN, NITRITE, LEUKOCYTESUR Sepsis Labs: @LABRCNTIP (procalcitonin:4,lacticidven:4)  Recent Results (from the past 240 hours)  Resp panel by RT-PCR (RSV, Flu A&B, Covid) Anterior  Nasal Swab     Status: None   Collection Time: 07/05/24  7:50 AM   Specimen: Anterior Nasal Swab  Result Value Ref Range Status   SARS Coronavirus 2 by RT PCR NEGATIVE NEGATIVE Final    Comment: (NOTE) SARS-CoV-2 target nucleic acids are NOT DETECTED.  The SARS-CoV-2 RNA is generally detectable in upper respiratory specimens during the acute phase of infection. The lowest concentration of SARS-CoV-2 viral copies this assay can detect is 138 copies/mL. A negative result does not preclude SARS-Cov-2 infection and should not be used as the sole basis for treatment or other patient management decisions. A negative result [place]le occur with  improper specimen collection/handling, submission of specimen other than nasopharyngeal swab, presence of viral mutation(s) within the areas targeted by this assay, and inadequate number of viral copies(<138 copies/mL). A negative result must be combined with clinical observations, patient history, and epidemiological information. The expected result is Negative.  Fact Sheet for Patients:  bloggercourse.com  Fact Sheet for Healthcare Providers:  seriousbroker.it  This test is no t yet approved or cleared by the [place]  FDA and  has been authorized for detection and/or diagnosis of SARS-CoV-2 by FDA under an Emergency Use Authorization (EUA). This EUA will remain  in effect (meaning this test can be used) for the duration of the COVID-19 declaration under Section 564(b)(1) of the Act, 21 U.S.C.section 360bbb-3(b)(1), unless the authorization is terminated  or revoked sooner.       Influenza A by PCR NEGATIVE NEGATIVE Final   Influenza B by PCR NEGATIVE NEGATIVE Final    Comment: (NOTE) The Xpert Xpress SARS-CoV-2/FLU/RSV plus assay is  intended as an aid in the diagnosis of influenza from Nasopharyngeal swab specimens and should not be used as a sole basis for treatment. Nasal washings  and aspirates are unacceptable for Xpert Xpress SARS-CoV-2/FLU/RSV testing.  Fact Sheet for Patients: bloggercourse.com  Fact Sheet for Healthcare Providers: seriousbroker.it  This test is not yet approved or cleared by the [place]  FDA and has been authorized for detection and/or diagnosis of SARS-CoV-2 by FDA under an Emergency Use Authorization (EUA). This EUA will remain in effect (meaning this test can be used) for the duration of the COVID-19 declaration under Section 564(b)(1) of the Act, 21 U.S.C. section 360bbb-3(b)(1), unless the authorization is terminated or revoked.     Resp Syncytial Virus by PCR NEGATIVE NEGATIVE Final    Comment: (NOTE) Fact Sheet for Patients: bloggercourse.com  Fact Sheet for Healthcare Providers: seriousbroker.it  This test is not yet approved or cleared by the [place]  FDA and has been authorized for detection and/or diagnosis of SARS-CoV-2 by FDA under an Emergency Use Authorization (EUA). This EUA will remain in effect (meaning this test can be used) for the duration of the COVID-19 declaration under Section 564(b)(1) of the Act, 21 U.S.C. section 360bbb-3(b)(1), unless the authorization is terminated or revoked.  Performed at [facility], [address], [place], [place] [address]          Radiology Studies: DG Chest Portable 1 View Result Date: 07/05/2024 EXAM: 1 VIEW(S) XRAY OF THE CHEST 07/05/2024 08:00:00 AM COMPARISON: 06/12/2012 CLINICAL HISTORY: SOB, suspect CHF/COPD combo FINDINGS: LUNGS AND PLEURA: No focal pulmonary opacity. No pleural effusion. No pneumothorax. HEART AND MEDIASTINUM: Stable cardiomediastinal silhouette. BONES AND SOFT TISSUES: Sternotomy wires are noted. No acute osseous abnormality. IMPRESSION: 1. No acute cardiopulmonary process. Electronically signed by: Lynwood Seip MD 07/05/2024  08:17 AM EST RP Workstation: [id number]             LOS: 0 days       Laneta Blunt, DO [facility] 07/05/2024, 10:34 AM    Dictation software Baiz have been used to generate the above note. Typos Milosevic occur and escape review in typed/dictated notes. Please contact Dr Blunt directly for clarity if needed.  Staff Butch message me via secure chat in Epic  but this [place] not receive an immediate response,  please page me for urgent matters!  If 7PM-7AM, please contact night coverage [email or web address]

## 2024-07-05 NOTE — ED Provider Notes (Addendum)
 [facility] Provider Note    Event Date/Time   First MD Initiated Contact with Patient 07/05/24 [phone]     (approximate)   History   Shortness of Breath   HPI  Johnathan Hernandez is a 73 y.o. male who presents to the ED for evaluation of Shortness of Breath   Reviewed [facility] PCP visit from August.  Obese patient with history of DM, GERD, stroke, PAD and CAD, chronic pain syndrome  Patient presents to the ED for evaluation of 3 days of progressively worsening shortness of breath.  Reports no increased cough, no increased sputum production.  Does report dyspnea on exertion.  Acute on chronic right leg pain in the setting of his illness.  He lives with his daughter and at the bedside she reports he has seemed more weak for the past 1 week or so.  No falls or injuries but he has been using additional DME to help him get around the house   Physical Exam   Triage Vital Signs: ED Triage Vitals  Encounter Vitals Group     BP 07/05/24 0743 (!) 165/84     Girls Systolic BP Percentile --      Girls Diastolic BP Percentile --      Boys Systolic BP Percentile --      Boys Diastolic BP Percentile --      Pulse Rate 07/05/24 0743 (!) 104     Resp 07/05/24 0743 17     Temp 07/05/24 0743 98.2 F (36.8 C)     Temp Source 07/05/24 0743 Oral     SpO2 07/05/24 0743 100 %     Weight 07/05/24 0745 237 lb 6.4 oz (107.7 kg)     Height 07/05/24 0745 5' 11 (1.803 m)     Head Circumference --      Peak Flow --      Pain Score 07/05/24 0745 0     Pain Loc --      Pain Education --      Exclude from Growth Chart --     Most recent vital signs: Vitals:   07/05/24 0743  BP: (!) 165/84  Pulse: (!) 104  Resp: 17  Temp: 98.2 F (36.8 C)  SpO2: 100%    General: Awake, no distress.  Appears short of breath but not tripoding or distressed CV:  Good peripheral perfusion.  Borderline tachycardia Resp:  Tachypneic, wheezing and decreased airflow throughout Abd:  Mild distention,  no tenderness MSK:  No deformity noted.  Pitting edema to bilateral lower extremities Neuro:  No focal deficits appreciated. Other:     ED Results / Procedures / Treatments   Labs (all labs ordered are listed, but only abnormal results are displayed) Labs Reviewed  CBC WITH DIFFERENTIAL/PLATELET - Abnormal; Notable for the following components:      Result Value   RBC 3.91 (*)    Hemoglobin 9.6 (*)    HCT 32.7 (*)    MCH 24.6 (*)    MCHC 29.4 (*)    All other components within normal limits  COMPREHENSIVE METABOLIC PANEL WITH GFR - Abnormal; Notable for the following components:   Glucose, Bld 282 (*)    All other components within normal limits  PRO BRAIN NATRIURETIC PEPTIDE - Abnormal; Notable for the following components:   Pro Brain Natriuretic Peptide 454.0 (*)    All other components within normal limits  TROPONIN T, HIGH SENSITIVITY - Abnormal; Notable for the following components:  Troponin T High Sensitivity 38 (*)    All other components within normal limits  RESP PANEL BY RT-PCR (RSV, FLU A&B, COVID)  RVPGX2  MAGNESIUM   TROPONIN T, HIGH SENSITIVITY    EKG Sinus rhythm with a rate of 103 bpm, right bundle, no STEMI.  Nonspecific ST changes  RADIOLOGY CXR interpreted by me without evidence of acute cardiopulmonary pathology.  Official radiology report(s): DG Chest Portable 1 View Result Date: 07/05/2024 EXAM: 1 VIEW(S) XRAY OF THE CHEST 07/05/2024 08:00:00 AM COMPARISON: 06/12/2012 CLINICAL HISTORY: SOB, suspect CHF/COPD combo FINDINGS: LUNGS AND PLEURA: No focal pulmonary opacity. No pleural effusion. No pneumothorax. HEART AND MEDIASTINUM: Stable cardiomediastinal silhouette. BONES AND SOFT TISSUES: Sternotomy wires are noted. No acute osseous abnormality. IMPRESSION: 1. No acute cardiopulmonary process. Electronically signed by: Lynwood Seip MD 07/05/2024 08:17 AM EST RP Workstation: [id number]    PROCEDURES and INTERVENTIONS:  .1-3 Lead EKG  Interpretation  Performed by: Claudene Rover, MD Authorized by: Claudene Rover, MD     Interpretation: abnormal     ECG rate:  102   ECG rate assessment: tachycardic     Rhythm: sinus tachycardia     Ectopy: none     Conduction: normal     Medications  ipratropium-albuterol  (DUONEB) 0.5-2.5 (3) MG/3ML nebulizer solution 6 mL (6 mLs Nebulization Given 07/05/24 0756)  methylPREDNISolone  sodium succinate (SOLU-MEDROL ) 125 mg/2 mL injection 125 mg (125 mg Intravenous Given 07/05/24 0756)  furosemide  (LASIX ) injection 60 mg (60 mg Intravenous Given 07/05/24 0829)     IMPRESSION / MDM / ASSESSMENT AND PLAN / ED COURSE  I reviewed the triage vital signs and the nursing notes.  Differential diagnosis includes, but is not limited to, ACS, PTX, PNA, muscle strain/spasm, PE, dissection, anxiety, pleural effusion  {Patient presents with symptoms of an acute illness or injury that is potentially life-threatening.  Patient presents to the ED with shortness of breath and signs of volume overload and wheezing.  He tachypneic and dyspneic but not hypoxic.  Wheezing on exam and signs of volume overload.  Elevated BNP and mildly elevated troponins, we will trend these.  Likely secondary to his respiratory status.  Negative viral swabs, no signs of pneumonia or infection or indications for antibiotics.  Mild hyperglycemia without acidosis.  Initiate diuresis, breathing treatments and steroids, consult medicine for admission  Clinical Course as of 07/05/24 0924  [facility] Jul 05, 2024  0910 Reassessed, improved wheezing on auscultation, now minimal wheezing with improved airflow.  Discussed plan of care and my recommendation for admission considering ongoing need for diuresis and possibly a new echo.  He is agreeable. [DS]  9087 No echocardiogram in our system, most recent one I can find in Care Everywhere is from 3 years ago, normal EF with grade 1 diastolic dysfunction [DS]  9075 I consult with medicine who  agrees to admit [DS]    Clinical Course User Index [DS] Claudene Rover, MD     FINAL CLINICAL IMPRESSION(S) / ED DIAGNOSES   Final diagnoses:  Shortness of breath  COPD exacerbation (HCC)     Rx / DC Orders   ED Discharge Orders     None        Note:  This document was prepared using Dragon voice recognition software and Germer include unintentional dictation errors.   Claudene Rover, MD 07/05/24 9086    Claudene Rover, MD 07/05/24 [phone]

## 2024-07-05 NOTE — Plan of Care (Signed)

## 2024-07-05 NOTE — ED Notes (Signed)
 Spoke to pharmacy about getting pt's meds verified.

## 2024-07-05 NOTE — TOC Initial Note (Signed)
 Transition of Care [facility]) - Initial/Assessment Note    Patient Details  Name: Johnathan Hernandez MRN: [id number] Date of Birth: Jun 11, 1951  [facility]) CM/SW Contact:    Nathanael CHRISTELLA Ring, RN Phone Number: 07/05/2024, 11:15 AM  Clinical Narrative:                 Patient admitted to the hospital for heart failure.  CM met with him and his daughter at the bedside in the ED.  He is from home, his daughter and son in law live with him.  He is independent in ADL's and drives.  Current with PCP Dr. Baldwin.  He reports being compliant with his medications and they are mail delivered to his home.  He is not interested in [facility] at [facility], he says that they came out before and they were always coming at different times so he decided he didn't want them coming out at all.   No TOC needs at this time, please re-consult if needs arise.  Expected Discharge Plan: Home/Self Care Barriers to Discharge: Continued Medical Work up   Patient Goals and CMS Choice Patient states their goals for this hospitalization and ongoing recovery are:: Wants to get back home, would like to be able to go outside and do more          Expected Discharge Plan and Services   Discharge Planning Services: CM Consult   Living arrangements for the past 2 months: Mobile Home                 DME Arranged: N/A         HH Arranged: Patient Refused HH          Prior Living Arrangements/Services Living arrangements for the past 2 months: Mobile Home Lives with:: Adult Children Patient language and need for interpreter reviewed:: Yes Do you feel safe going back to the place where you live?: Yes      Need for Family Participation in Patient Care: Yes (Comment) Care giver support system in place?: Yes (comment) Current home services: DME Elene, RW) Criminal Activity/Legal Involvement Pertinent to Current Situation/Hospitalization: No - Comment as needed  Activities of Daily Living      Permission  Sought/Granted Permission sought to share information with : Family Supports Permission granted to share information with : Yes, Verbal Permission Granted  Share Information with NAME: Madelin Blush     Permission granted to share info w Relationship: daughter  Permission granted to share info w Contact Information: [phone]  Emotional Assessment Appearance:: Appears stated age Attitude/Demeanor/Rapport: Engaged Affect (typically observed): Accepting Orientation: : Oriented to Self, Oriented to Place, Oriented to  Time, Oriented to Situation Alcohol / Substance Use: Not Applicable Psych Involvement: No (comment)  Admission diagnosis:  (HFpEF) heart failure with preserved ejection fraction (HCC) [I50.30] Patient Active Problem List   Diagnosis Date Noted   (HFpEF) heart failure with preserved ejection fraction (HCC) 07/05/2024   Noncompliance with medication treatment due to intermittent use of medication 11/24/2015   Gastritis 11/24/2015   Allergic rhinitis 09/03/2015   GERD (gastroesophageal reflux disease) 09/03/2015   Other vitamin B12 deficiency anemia 09/10/2012   Diabetic polyneuropathy (HCC) 09/10/2012   Radiculopathy of lumbar region 07/24/2012   History of pulmonary embolism 07/24/2012   Hypertension associated with diabetes (HCC) 04/30/2012   Vasomotor flushing 04/30/2012   Hyperlipidemia with target LDL less than 70 11/16/2011   Diabetes mellitus with atherosclerosis of arteries of extremities (HCC) 11/14/2011   Peripheral  vascular disease    History of tobacco abuse    Coronary artery disease    PCP:  Baldwin Lenis, MD Pharmacy:   [facility] [address] [place] - [address] [place] [address] Phone: [phone] Fax: [phone]  [facility]) - [place], [place] - [address] [facility] [address] [place] [address] [place] [address] Phone: [phone] Fax: [phone]     Social Drivers  of Health (SDOH) Social History: SDOH Screenings   Food Insecurity: No Food Insecurity (04/03/2024)   Received from [facility]  Transportation Needs: No Transportation Needs (04/03/2024)   Received from [facility]  Utilities: Low Risk (04/03/2024)   Received from [facility]  Financial Resource Strain: Low Risk (04/03/2024)   Received from [facility]  Physical Activity: Inactive (05/01/2023)   Received from [facility]  Social Connections: Socially Isolated (05/01/2023)   Received from [facility]  Stress: No Stress Concern Present (05/01/2023)   Received from [facility]  Tobacco Use: Medium Risk (07/05/2024)  Health Literacy: Low Risk (05/01/2023)   Received from [facility]   SDOH Interventions:     Readmission Risk Interventions     No data to display

## 2024-07-05 NOTE — ED Triage Notes (Signed)
 Pt brought in by EMS from home for SOB x 3 days, worse today. Per EMS, pt 92% on RA; DOE, expiratory wheezes.

## 2024-07-05 NOTE — Progress Notes (Signed)
 Received message from ED medic - pt asking to leave and come back tomorrow, no specific questions about the diagnosis or plan. I advised that if he has medical questions I am happy to come chat with him. Otherwise plan was clearly explained on admission and, in the absence of new concerns/questions, I have no changes to suggest. He cas capacity to make decisions to consent/refuse treatment and is free to sign out AMA if he wishes. See medic's note

## 2024-07-05 NOTE — ED Notes (Signed)
 Called CCMD and added pt to board

## 2024-07-06 DIAGNOSIS — I5031 Acute diastolic (congestive) heart failure: Secondary | ICD-10-CM | POA: Diagnosis not present

## 2024-07-06 LAB — GLUCOSE, CAPILLARY
Glucose-Capillary: 281 mg/dL — ABNORMAL HIGH (ref 70–99)
Glucose-Capillary: 219 mg/dL — ABNORMAL HIGH (ref 70–99)
Glucose-Capillary: 304 mg/dL — ABNORMAL HIGH (ref 70–99)
Glucose-Capillary: 301 mg/dL — ABNORMAL HIGH (ref 70–99)

## 2024-07-06 LAB — CBC
HCT: 28.9 % — ABNORMAL LOW (ref 39.0–52.0)
Hemoglobin: 9 g/dL — ABNORMAL LOW (ref 13.0–17.0)
MCH: 25.1 pg — ABNORMAL LOW (ref 26.0–34.0)
MCHC: 31.1 g/dL (ref 30.0–36.0)
MCV: 80.5 fL (ref 80.0–100.0)
Platelets: 249 K/uL (ref 150–400)
RBC: 3.59 MIL/uL — ABNORMAL LOW (ref 4.22–5.81)
RDW: 15.4 % (ref 11.5–15.5)
WBC: 11.8 K/uL — ABNORMAL HIGH (ref 4.0–10.5)
nRBC: 0 % (ref 0.0–0.2)

## 2024-07-06 LAB — BASIC METABOLIC PANEL WITH GFR
Anion gap: 11 (ref 5–15)
BUN: 28 mg/dL — ABNORMAL HIGH (ref 8–23)
CO2: 25 mmol/L (ref 22–32)
Calcium: 9.5 mg/dL (ref 8.9–10.3)
Chloride: 102 mmol/L (ref 98–111)
Creatinine, Ser: 1.46 mg/dL — ABNORMAL HIGH (ref 0.61–1.24)
GFR, Estimated: 50 mL/min — ABNORMAL LOW (ref >60)
Glucose, Bld: 309 mg/dL — ABNORMAL HIGH (ref 70–99)
Potassium: 4.2 mmol/L (ref 3.5–5.1)
Sodium: 138 mmol/L (ref 135–145)

## 2024-07-06 MED ORDER — GABAPENTIN 400 MG PO CAPS
800.0000 mg | ORAL_CAPSULE | Freq: Three times a day (TID) | ORAL | Status: DC
  Administered 2024-07-06 – 2024-07-08 (×8): 800 mg via ORAL
  Filled 2024-07-06 (×9): qty 2

## 2024-07-06 MED ORDER — IPRATROPIUM-ALBUTEROL 0.5-2.5 (3) MG/3ML IN SOLN
3.0000 mL | Freq: Two times a day (BID) | RESPIRATORY_TRACT | Status: DC
  Administered 2024-07-06 – 2024-07-07 (×2): 3 mL via RESPIRATORY_TRACT
  Filled 2024-07-06 (×2): qty 3

## 2024-07-06 MED ORDER — INSULIN GLARGINE-YFGN 100 UNIT/ML ~~LOC~~ SOLN
54.0000 [IU] | Freq: Every day | SUBCUTANEOUS | Status: DC
  Administered 2024-07-06 – 2024-07-07 (×2): 54 [IU] via SUBCUTANEOUS
  Filled 2024-07-06 (×4): qty 0.54

## 2024-07-06 NOTE — Progress Notes (Signed)
  PROGRESS NOTE    Johnathan Hernandez  FMW:[id number] DOB: 1950/11/25 DOA: 07/05/2024 PCP: Baldwin Lenis, MD  119A/119A-AA  LOS: 1 day   Brief hospital course:   Assessment & Plan: Shortness of breath  COPD CHF uncertain type, hx diastolic dysfunction  Treat underlying cause(s) as below    Acute hypoxemic respiratory failure --need 2L --Continue supplemental O2 to keep sats >=90%, wean as tolerated  Presumed COPD exacerbation Remote hx Tobacco  --cont prednisone  and DuoNeb   Acute on chronic CHF / HFpEF  --received IV lasix  60, 40 x2 --hold further diuresis due to Cr bump   CAD s/p CABG HTN HLD  hx VTE  --cont Eliquis   PVD/PAD w/ hx revascularization   Anemia  Records report anemia d/t CKD but no severe renal diseaes Iron, TIBC, Ferritin    hx TIA and CVA w/o residual deficits but does have residual neuropathic pain Pain control as below   DM2 on long acting insulin  and po meds --cont glargine 54u nightly --ACHS and SSI   CKD noted on hx but baseline Cr 1.0 and GFR >60, anemia d/t CKD but question this in setting of no severe renal disease Monitor BMP   GERD Continue home PPI   Anx/Dep Continue home SSRI   Chronic pain / neuropathic pain post-CVA and post-laminectomy managed by pain clinic [facility] last visit 10/16 started Nucynta 50 mg tid but recently switched back to hydromorphone , he is also on gabapentin , tylenol , flexeril, voltaren, lidoderm , SNRI PDMP reviewed --cont home oral dilaudid    DVT prophylaxis: On:Eliquis  Code Status: Full code  Family Communication:  Level of care: Telemetry Dispo:   The patient is from: home Anticipated d/c is to: home Anticipated d/c date is: 1-2 days   Subjective and Interval History:  Pt reported breathing improved and wanted to go home.  Walk test showed pt desat to 81% on room air.   Objective: Vitals:   07/06/24 0437 07/06/24 0440 07/06/24 0756 07/06/24 1647  BP: (!) 162/75  (!) 148/83 (!) 159/82  Pulse:  (!) 108  (!) 104 98  Resp: 19  18 16   Temp: 98.4 F (36.9 C)  97.8 F (36.6 C) 98.6 F (37 C)  TempSrc: Oral  Oral Oral  SpO2: 99%  97% 97%  Weight:  103.6 kg    Height:        Intake/Output Summary (Last 24 hours) at 07/06/2024 1931 Last data filed at 07/06/2024 1900 Gross per 24 hour  Intake 720 ml  Output 3100 ml  Net -2380 ml   Filed Weights   07/05/24 0745 07/06/24 0440  Weight: 107.7 kg 103.6 kg    Examination:   Constitutional: NAD, AAOx3 HEENT: conjunctivae and lids normal, EOMI CV: No cyanosis.   RESP: normal respiratory effort, on 2L Neuro: II - XII grossly intact.   Psych: Normal mood and affect.  Appropriate judgement and reason   Data Reviewed: I have personally reviewed labs and imaging studies  Time spent: 50 minutes  Ellouise Haber, MD [facility] If 7PM-7AM, please contact night-coverage 07/06/2024, 7:31 PM

## 2024-07-06 NOTE — Plan of Care (Signed)
  Problem: Coping: Goal: Ability to adjust to condition or change in health will improve Outcome: Progressing   Problem: Fluid Volume: Goal: Ability to maintain a balanced intake and output will improve Outcome: Progressing   Problem: Health Behavior/Discharge Planning: Goal: Ability to identify and utilize available resources and services will improve Outcome: Progressing Goal: Ability to manage health-related needs will improve Outcome: Progressing

## 2024-07-06 NOTE — Progress Notes (Signed)
 SATURATION QUALIFICATIONS: (This note is used to comply with regulatory documentation for home oxygen)  Patient Saturations on Room Air at Rest = 89%  Patient Saturations on Room Air while Ambulating = 81%  Patient Saturations on 2 Liters of oxygen while Ambulating = 96%

## 2024-07-07 DIAGNOSIS — I5031 Acute diastolic (congestive) heart failure: Secondary | ICD-10-CM | POA: Diagnosis not present

## 2024-07-07 LAB — BASIC METABOLIC PANEL WITH GFR
Anion gap: 10 (ref 5–15)
BUN: 29 mg/dL — ABNORMAL HIGH (ref 8–23)
CO2: 27 mmol/L (ref 22–32)
Calcium: 9.4 mg/dL (ref 8.9–10.3)
Chloride: 102 mmol/L (ref 98–111)
Creatinine, Ser: 1.29 mg/dL — ABNORMAL HIGH (ref 0.61–1.24)
GFR, Estimated: 58 mL/min — ABNORMAL LOW (ref >60)
Glucose, Bld: 223 mg/dL — ABNORMAL HIGH (ref 70–99)
Potassium: 3.8 mmol/L (ref 3.5–5.1)
Sodium: 139 mmol/L (ref 135–145)

## 2024-07-07 LAB — MAGNESIUM: Magnesium: 2.3 mg/dL (ref 1.7–2.4)

## 2024-07-07 LAB — GLUCOSE, CAPILLARY
Glucose-Capillary: 122 mg/dL — ABNORMAL HIGH (ref 70–99)
Glucose-Capillary: 211 mg/dL — ABNORMAL HIGH (ref 70–99)
Glucose-Capillary: 302 mg/dL — ABNORMAL HIGH (ref 70–99)
Glucose-Capillary: 94 mg/dL (ref 70–99)

## 2024-07-07 MED ORDER — IPRATROPIUM-ALBUTEROL 0.5-2.5 (3) MG/3ML IN SOLN
3.0000 mL | Freq: Two times a day (BID) | RESPIRATORY_TRACT | Status: DC
  Administered 2024-07-07 – 2024-07-08 (×2): 3 mL via RESPIRATORY_TRACT
  Filled 2024-07-07 (×2): qty 3

## 2024-07-07 MED ORDER — SODIUM CHLORIDE 3 % IN NEBU
4.0000 mL | INHALATION_SOLUTION | Freq: Two times a day (BID) | RESPIRATORY_TRACT | Status: DC
  Administered 2024-07-07 – 2024-07-08 (×2): 4 mL via RESPIRATORY_TRACT
  Filled 2024-07-07 (×3): qty 4

## 2024-07-07 NOTE — Evaluation (Signed)
 Physical Therapy Evaluation Patient Details Name: Colbey Wirtanen Henly MRN: [id number] DOB: 21-Jan-1951 Today's Date: 07/07/2024  History of Present Illness  Johnathan Hernandez is a 73yoM who comes to [facility] with acute onset SOB, LEE. Pt admitted with volume overload, O2 needs, diuresis inittiated. At baseline pt is a household AMB with [phone], uses a golf cart in community. Pt has falls history. Pt recently admitted at [facility] earlier this year for diabetic ulcer. Pt also has history of orthostatic hypotension.  Clinical Impression  Pt received on 2L/min O2 on arrival, DTR at bed side. Pt able to demonstrate bed mobility transfers, and AMB of 2x133ft at his baseline independence. Pt desaturates to 84% on room air, >60 sec to recover. Same walk performed on 2L/min results in sats at 95%. Pt reports he does not need any HHPT at [facility] and I agree with this. Will continue to follow.       If plan is discharge home, recommend the following: A little help with walking and/or transfers;Assist for transportation;Help with stairs or ramp for entrance   Can travel by private vehicle        Equipment Recommendations Other (comment) (O2)  Recommendations for Other Services       Functional Status Assessment Patient has had a recent decline in their functional status and demonstrates the ability to make significant improvements in function in a reasonable and predictable amount of time.     Precautions / Restrictions Precautions Precautions: Fall Restrictions Weight Bearing Restrictions Per Provider Order: No      Mobility  Bed Mobility Overal bed mobility: Modified Independent                  Transfers Overall transfer level: Needs assistance Equipment used: Rollator (4 wheels) Transfers: Sit to/from Stand Sit to Stand: Supervision                Ambulation/Gait Ambulation/Gait assistance: Contact guard assist Gait Distance (Feet): 120 Feet (twice) Assistive device: Rollator (4 wheels) Gait  Pattern/deviations: Step-through pattern Gait velocity: 0.46m/s     General Gait Details: feels at baseline for strength and balance; no symptoms of hypoxia despite hypoxia  Stairs            Wheelchair Mobility     Tilt Bed    Modified Rankin (Stroke Patients Only)       Balance                                             Pertinent Vitals/Pain Pain Assessment Pain Assessment: No/denies pain    Home Living Family/patient expects to be discharged to:: Private residence Living Arrangements: Children Available Help at Discharge: Family             Home Equipment: Rollator (4 wheels)      Prior Function                       Extremity/Trunk Facilities Manager       General Comments  Exercises     Assessment/Plan    PT Assessment Patient needs continued PT services  PT Problem List Decreased strength;Decreased range of motion;Decreased activity tolerance;Decreased balance;Decreased mobility;Cardiopulmonary status limiting activity;Decreased knowledge of use of DME       PT Treatment Interventions Gait training;Stair training;Functional mobility training;Therapeutic activities;Therapeutic exercise;Balance training;Patient/family education    PT Goals (Current goals can be found in the Care Plan section)  Acute Rehab PT Goals Patient Stated Goal: eager to leave PT Goal Formulation: With patient Time For Goal Achievement: 07/21/24 Potential to Achieve Goals: Fair    Frequency Min 2X/week     Co-evaluation               AM-PAC PT 6 Clicks Mobility  Outcome Measure Help needed turning from your back to your side while in a flat bed without using bedrails?: A Little Help needed moving from lying on your back to sitting on the side of a flat bed without using bedrails?: A Little Help needed  moving to and from a bed to a chair (including a wheelchair)?: A Little Help needed standing up from a chair using your arms (e.g., wheelchair or bedside chair)?: A Little Help needed to walk in hospital room?: A Little Help needed climbing 3-5 steps with a railing? : A Little 6 Click Score: 18    End of Session Equipment Utilized During Treatment: Oxygen;Gait belt Activity Tolerance: Patient tolerated treatment well;No increased pain Patient left: in bed;with nursing/sitter in room;with call bell/phone within reach Nurse Communication: Mobility status PT Visit Diagnosis: Difficulty in walking, not elsewhere classified (R26.2);Other abnormalities of gait and mobility (R26.89);Muscle weakness (generalized) (M62.81)    Time: 8450-8384 PT Time Calculation (min) (ACUTE ONLY): 26 min   Charges:   PT Evaluation $PT Eval Moderate Complexity: 1 Mod PT Treatments $Therapeutic Activity: 8-22 mins PT General Charges $$ ACUTE PT VISIT: 1 Visit       4:37 PM, 07/07/24 Peggye JAYSON Linear, PT, DPT Physical Therapist - [facility]  [phone] (ASCOM)    Medina Degraffenreid C 07/07/2024, 4:30 PM

## 2024-07-07 NOTE — Progress Notes (Signed)
  PROGRESS NOTE    Johnathan Hernandez  FMW:[id number] DOB: 10-11-1950 DOA: 07/05/2024 PCP: Baldwin Lenis, MD  119A/119A-AA  LOS: 2 days   Brief hospital course:   Assessment & Plan: Shortness of breath  COPD CHF uncertain type, hx diastolic dysfunction  Treat underlying cause(s) as below    Acute hypoxemic respiratory failure --needs 2L --Continue supplemental O2 to keep sats >=90%, wean as tolerated --walk test prior to discharge.  Presumed COPD exacerbation Remote hx Tobacco  --cont prednisone  and DuoNeb --start hypertonic saline neb BID    Acute on chronic CHF / HFpEF  --received IV lasix  60, 40 x2 --hold further diuresis due to Cr bump   CAD s/p CABG HTN HLD  hx VTE  --cont Eliquis   PVD/PAD w/ hx revascularization   Anemia  Records report anemia d/t CKD but no severe renal diseaes Iron, TIBC, Ferritin    hx TIA and CVA w/o residual deficits but does have residual neuropathic pain Pain control as below   DM2 on long acting insulin  and po meds --cont glargine 54u nightly --ACHS and SSI   CKD noted on hx but baseline Cr 1.0 and GFR >60, anemia d/t CKD but question this in setting of no severe renal disease Monitor BMP   GERD Continue home PPI   Anx/Dep --cont Effexor     Chronic pain / neuropathic pain post-CVA and post-laminectomy managed by pain clinic [facility] last visit 10/16 started Nucynta 50 mg tid but recently switched back to hydromorphone , he is also on gabapentin , tylenol , flexeril, voltaren, lidoderm , SNRI PDMP reviewed --cont home oral dilaudid    DVT prophylaxis: On:Eliquis  Code Status: Full code  Family Communication:  Level of care: Telemetry Dispo:   The patient is from: home Anticipated d/c is to: home Anticipated d/c date is: tomorrow   Subjective and Interval History:  Pt had dyspnea on exertion.  Reported feeling something stuck that he couldn't cough up.   Objective: Vitals:   07/07/24 0411 07/07/24 0800 07/07/24 1153  07/07/24 1610  BP: (!) 165/91 136/85 119/76   Pulse: 89 97 87   Resp: 18 16 14    Temp: 98.4 F (36.9 C) 98.9 F (37.2 C) 98.4 F (36.9 C)   TempSrc: Oral Oral Oral   SpO2: 100% 97% 95% 92%  Weight: 102 kg     Height:        Intake/Output Summary (Last 24 hours) at 07/07/2024 1753 Last data filed at 07/07/2024 1300 Gross per 24 hour  Intake 480 ml  Output 1750 ml  Net -1270 ml   Filed Weights   07/05/24 0745 07/06/24 0440 07/07/24 0411  Weight: 107.7 kg 103.6 kg 102 kg    Examination:   Constitutional: NAD, alert, oriented HEENT: conjunctivae and lids normal, EOMI CV: No cyanosis.   RESP: normal respiratory effort Neuro: II - XII grossly intact.     Data Reviewed: I have personally reviewed labs and imaging studies  Time spent: 35 minutes  Ellouise Haber, MD [facility] If 7PM-7AM, please contact night-coverage 07/07/2024, 5:53 PM

## 2024-07-07 NOTE — Plan of Care (Signed)

## 2024-07-07 NOTE — Progress Notes (Signed)
 OXYGEN QUALIFICATION NOTE:  SpO2 on room air at rest = 92% SpO2 on room air while ambulating = 84% SpO2 on 2 liters of O2 while ambulating = 95%  Pt desaturates on room air with mobility and requires supplemental oxygen to maintain a safe saturation level.    4:39 PM, 07/07/24 Peggye JAYSON Linear, PT, DPT Physical Therapist - [facility]  [phone] [facility])

## 2024-07-07 NOTE — Care Management Important Message (Signed)
 Important Message  Patient Details  Name: Johnathan Hernandez MRN: [id number] Date of Birth: 02-Ildefonso-1952   Important Message Given:  Yes - Medicare IM     Vinod Mikesell W, CMA 07/07/2024, 1:37 PM

## 2024-07-08 ENCOUNTER — Other Ambulatory Visit (HOSPITAL_COMMUNITY): Payer: Self-pay

## 2024-07-08 ENCOUNTER — Telehealth (HOSPITAL_COMMUNITY): Payer: Self-pay

## 2024-07-08 ENCOUNTER — Other Ambulatory Visit: Payer: Self-pay

## 2024-07-08 DIAGNOSIS — J441 Chronic obstructive pulmonary disease with (acute) exacerbation: Secondary | ICD-10-CM

## 2024-07-08 LAB — BASIC METABOLIC PANEL WITH GFR
Anion gap: 10 (ref 5–15)
BUN: 28 mg/dL — ABNORMAL HIGH (ref 8–23)
CO2: 27 mmol/L (ref 22–32)
Calcium: 9.3 mg/dL (ref 8.9–10.3)
Chloride: 104 mmol/L (ref 98–111)
Creatinine, Ser: 1.24 mg/dL (ref 0.61–1.24)
GFR, Estimated: >60 mL/min (ref >60)
Glucose, Bld: 211 mg/dL — ABNORMAL HIGH (ref 70–99)
Potassium: 3.8 mmol/L (ref 3.5–5.1)
Sodium: 141 mmol/L (ref 135–145)

## 2024-07-08 LAB — GLUCOSE, CAPILLARY
Glucose-Capillary: 179 mg/dL — ABNORMAL HIGH (ref 70–99)
Glucose-Capillary: 170 mg/dL — ABNORMAL HIGH (ref 70–99)

## 2024-07-08 LAB — MAGNESIUM: Magnesium: 2.5 mg/dL — ABNORMAL HIGH (ref 1.7–2.4)

## 2024-07-08 MED ORDER — SPIRONOLACTONE 12.5 MG HALF TABLET
12.5000 mg | ORAL_TABLET | Freq: Every day | ORAL | Status: DC
  Administered 2024-07-08: 12.5 mg via ORAL
  Filled 2024-07-08: qty 1

## 2024-07-08 MED ORDER — PREDNISONE 20 MG PO TABS
40.0000 mg | ORAL_TABLET | Freq: Every day | ORAL | 0 refills | Status: AC
  Filled 2024-07-08: qty 4, 2d supply, fill #0

## 2024-07-08 MED ORDER — HYDROMORPHONE HCL 8 MG PO TABS: 8.0000 mg | ORAL_TABLET | Freq: Three times a day (TID) | ORAL | Status: AC

## 2024-07-08 MED ORDER — SPIRONOLACTONE 25 MG PO TABS
12.5000 mg | ORAL_TABLET | Freq: Every day | ORAL | 0 refills | Status: AC
  Filled 2024-07-08: qty 45, 90d supply, fill #0

## 2024-07-08 MED ORDER — EMPAGLIFLOZIN 10 MG PO TABS
10.0000 mg | ORAL_TABLET | Freq: Every day | ORAL | 0 refills | Status: AC
  Filled 2024-07-08: qty 90, 90d supply, fill #0

## 2024-07-08 NOTE — Telephone Encounter (Signed)
 Pharmacy Patient Advocate Encounter  Insurance verification completed.    The patient is insured through [employer]. Patient has Medicare and is not eligible for a copay card, but Remmert be able to apply for patient assistance or Medicare RX Payment Plan (Patient Must reach out to their plan, if eligible for payment plan), if available.    Ran test claim for Jardiance  10mg  and the current 30 day co-pay is $0.  Ran test claim for Farxiga 10mg  and the current 30 day co-pay is $0.   This test claim was processed through [employer]- copay amounts Hohler vary at other pharmacies due to [employer], or as the patient moves through the different stages of their insurance plan.

## 2024-07-08 NOTE — Progress Notes (Signed)
 Heart Failure Nurse Navigator Progress Note  PCP: Baldwin Lenis, MD PCP-Cardiologist: Admission Diagnosis: Shortness of breath COPD exacerbation [facility]) Admitted from: Home via EMS  Presentation:   Johnathan Hernandez is a 73 y.o. male who presented with shortness of breath with exertion for 3 days. LE edema.  Patient on Eliquis  + ASA. Had 1 episode of loose stool prior to admission.    Patient with history of DM, GERD, Stroke, PAD,CAD s/p CABG, DM2  and Chronic Pain Syndrome. BP 165/84, Pulse 104. Pro BNP 454.  Troponin 38.EKG no STEMI.   Chest x-Ray No acute cardiopulmonary process.   ECHO/ LVEF: 60-65% Grade I Diastolic Dysfunction (impaired relaxation)   Clinical Course:  Past Medical History:  Diagnosis Date   Coronary artery disease    Diabetes mellitus    History of tobacco abuse    quit in 2006 after 5 vessel cabg   Hyperlipidemia    Hypertension    Peripheral vascular disease Dec 2012   [facility], treated with extensive arterial bypass     Social History   Socioeconomic History   Marital status: Married    Spouse name: Not on file   Number of children: Not on file   Years of education: Not on file   Highest education level: Not on file  Occupational History   Not on file  Tobacco Use   Smoking status: Former    Current packs/day: 0.00    Types: Cigarettes    Quit date: 11/13/2004    Years since quitting: 19.6   Smokeless tobacco: Never  Substance and Sexual Activity   Alcohol use: No   Drug use: No   Sexual activity: Not on file  Other Topics Concern   Not on file  Social History Narrative   Not on file   Social Drivers of Health   Financial Resource Strain: Low Risk (04/03/2024)   Received from [facility]   Overall Financial Resource Strain (CARDIA)    How hard is it for you to pay for the very basics like food, housing, medical care, and heating?: Not very hard  Food Insecurity: No Food Insecurity (07/05/2024)   Hunger Vital Sign    Worried About Running Out  of Food in the Last Year: Never true    Ran Out of Food in the Last Year: Never true  Transportation Needs: No Transportation Needs (07/05/2024)   PRAPARE - Administrator, Civil Service (Medical): No    Lack of Transportation (Non-Medical): No  Physical Activity: Inactive (05/01/2023)   Received from [facility]   Exercise Vital Sign    On average, how many days per week do you engage in moderate to strenuous exercise (like a brisk walk)?: 0 days    On average, how many minutes do you engage in exercise at this level?: 0 min  Stress: No Stress Concern Present (05/01/2023)   Received from [facility] - Occupational Stress Questionnaire    Feeling of Stress : Not at all  Social Connections: Moderately Integrated (07/05/2024)   Social Connection and Isolation Panel    Frequency of Communication with Friends and Family: More than three times a week    Frequency of Social Gatherings with Friends and Family: More than three times a week    Attends Religious Services: 1 to 4 times per year    Active Member of [employer] or Organizations: No    Attends Banker Meetings:  1 to 4 times per year    Marital Status: Widowed   Education Assessment and Provision: Detailed education and instructions provided on heart failure disease management including the following:  Signs and symptoms of Heart Failure When to call the physician Importance of daily weights Low sodium diet Fluid restriction Medication management Anticipated future follow-up appointments  Patient education given on each of the above topics.  Patient acknowledges understanding via teach back method and acceptance of all instructions.  Education Materials:  Living Better With Heart Failure Booklet, HF zone tool, & Daily Weight Tracker Tool.  Patient has scale at home: Yes Patient has pill box at home: Yes-but doesn't use it.  Adminsters directly from pill  boxes.    High Risk Criteria for Readmission and/or Poor Patient Outcomes: Heart failure hospital admissions (last 6 months): 1  No Show rate: 30% Difficult social situation: None Demonstrates medication adherence: Yes Primary Language: English Literacy level: 8th Grade. Reading, Writing @ Comprehension  Barriers of Care:   Daily Weights-Has a scale but was not performing daily weights prior to this  Diet & Fluid Restrictions-likes snacks especially Pork Rinds and Cheetos Drinks 3 cups of coffee a day. 2 Pepsi Zeros and Water.  Considerations/Referrals:  Referral made to Heart Failure Pharmacist Stewardship: Yes Referral made to Heart Failure CSW/NCM TOC: No Referral made to Heart & Vascular TOC clinic: Yes. [facility] 07/23/2024 @ 12:00.  Items for Follow-up on DC/TOC: Daily Weights Diet & Fluid Restrictions Continued Heart Failure Education  Charmaine Pines, RN, BSN [facility] Heart Failure Navigator Secure Chat Only

## 2024-07-08 NOTE — Progress Notes (Signed)
 07/08/2024 1:36 PM [facility] Brief Progress Note   _____________________________________________________________________________________________________________  Patient Name: Johnathan Hernandez Patient DOB: Apr 01, 1951 Date: @TODAY @      Data: received EPIC secure chat message enquiring on DME o2 delivery    Action: phone call to TOC CM. Confirmed CM in process of setting up home o2 with [employer] and assured that tank would be delivered once process completed.    Response:  Bedside staff updated on status via secure chat message.  _____________________________________________________________________________________________________________  [facility] RN Expeditor Candance Bohlman, Corean Lobstein Please contact us  directly via secure chat (search for [facility]) or by calling us  at [phone] [facility]).

## 2024-07-08 NOTE — Progress Notes (Signed)
 Repeat ambulation oxygen need test  SpO2 on room air at rest = 95% SpO2 on room air while ambulating = 83% SpO2 on 2 liters of O2 while ambulating = 96%

## 2024-07-08 NOTE — TOC Transition Note (Signed)
 Transition of Care [facility]) - Discharge Note   Patient Details  Name: Johnathan Hernandez MRN: [id number] Date of Birth: Jan 26, 1951  Transition of Care [facility]) CM/SW Contact:  Dalia GORMAN Fuse, RN Phone Number: 07/08/2024, 1:30 PM   Clinical Narrative:    Patient is medically ready to discharge to home with O2. Referral for O2 called to [employer]. Mitch accepted. No other TOC needs at this time.   Final next level of care: Home/Self Care Barriers to Discharge: Barriers Resolved   Patient Goals and CMS Choice Patient states their goals for this hospitalization and ongoing recovery are:: Wants to get back home, would like to be able to go outside and do more          Discharge Placement                       Discharge Plan and Services Additional resources added to the After Visit Summary for     Discharge Planning Services: CM Consult            DME Arranged: Oxygen DME Agency: [employer] Date DME Agency Contacted: 07/08/24 Time DME Agency Contacted: 1330 Representative spoke with at DME Agency: [facility] HH Arranged: Patient Refused HH          Social Drivers of Health (SDOH) Interventions SDOH Screenings   Food Insecurity: No Food Insecurity (07/05/2024)  Housing: Low Risk  (07/05/2024)  Transportation Needs: No Transportation Needs (07/05/2024)  Utilities: Not At Risk (07/05/2024)  Financial Resource Strain: Medium Risk (07/08/2024)  Physical Activity: Inactive (05/01/2023)   Received from [facility]  Social Connections: Moderately Integrated (07/05/2024)  Stress: No Stress Concern Present (05/01/2023)   Received from [facility]  Tobacco Use: Medium Risk (07/05/2024)  Health Literacy: Low Risk (05/01/2023)   Received from [facility]     Readmission Risk Interventions     No data to display

## 2024-07-08 NOTE — Plan of Care (Signed)
 Problem: Education: Goal: Ability to describe self-care measures that may prevent or decrease complications (Diabetes Survival Skills Education) will improve Outcome: Adequate for Discharge Goal: Individualized Educational Video(s) Outcome: Adequate for Discharge   Problem: Coping: Goal: Ability to adjust to condition or change in health will improve Outcome: Adequate for Discharge   Problem: Fluid Volume: Goal: Ability to maintain a balanced intake and output will improve Outcome: Adequate for Discharge   Problem: Health Behavior/Discharge Planning: Goal: Ability to identify and utilize available resources and services will improve Outcome: Adequate for Discharge Goal: Ability to manage health-related needs will improve Outcome: Adequate for Discharge   Problem: Metabolic: Goal: Ability to maintain appropriate glucose levels will improve Outcome: Adequate for Discharge   Problem: Nutritional: Goal: Maintenance of adequate nutrition will improve Outcome: Adequate for Discharge Goal: Progress toward achieving an optimal weight will improve Outcome: Adequate for Discharge   Problem: Skin Integrity: Goal: Risk for impaired skin integrity will decrease Outcome: Adequate for Discharge   Problem: Tissue Perfusion: Goal: Adequacy of tissue perfusion will improve Outcome: Adequate for Discharge   Problem: Education: Goal: Knowledge of General Education information will improve Description: Including pain rating scale, medication(s)/side effects and non-pharmacologic comfort measures Outcome: Adequate for Discharge   Problem: Health Behavior/Discharge Planning: Goal: Ability to manage health-related needs will improve Outcome: Adequate for Discharge   Problem: Clinical Measurements: Goal: Ability to maintain clinical measurements within normal limits will improve Outcome: Adequate for Discharge Goal: Will remain free from infection Outcome: Adequate for Discharge Goal:  Diagnostic test results will improve Outcome: Adequate for Discharge Goal: Respiratory complications will improve Outcome: Adequate for Discharge Goal: Cardiovascular complication will be avoided Outcome: Adequate for Discharge   Problem: Activity: Goal: Risk for activity intolerance will decrease Outcome: Adequate for Discharge   Problem: Nutrition: Goal: Adequate nutrition will be maintained Outcome: Adequate for Discharge   Problem: Coping: Goal: Level of anxiety will decrease Outcome: Adequate for Discharge   Problem: Elimination: Goal: Will not experience complications related to bowel motility Outcome: Adequate for Discharge Goal: Will not experience complications related to urinary retention Outcome: Adequate for Discharge   Problem: Pain Managment: Goal: General experience of comfort will improve and/or be controlled Outcome: Adequate for Discharge   Problem: Safety: Goal: Ability to remain free from injury will improve Outcome: Adequate for Discharge   Problem: Skin Integrity: Goal: Risk for impaired skin integrity will decrease Outcome: Adequate for Discharge   Problem: Education: Goal: Ability to demonstrate management of disease process will improve Outcome: Adequate for Discharge Goal: Ability to verbalize understanding of medication therapies will improve Outcome: Adequate for Discharge Goal: Individualized Educational Video(s) Outcome: Adequate for Discharge   Problem: Activity: Goal: Capacity to carry out activities will improve Outcome: Adequate for Discharge   Problem: Cardiac: Goal: Ability to achieve and maintain adequate cardiopulmonary perfusion will improve Outcome: Adequate for Discharge   Problem: Education: Goal: Knowledge of disease or condition will improve Outcome: Adequate for Discharge Goal: Knowledge of the prescribed therapeutic regimen will improve Outcome: Adequate for Discharge Goal: Individualized Educational  Video(s) Outcome: Adequate for Discharge   Problem: Activity: Goal: Ability to tolerate increased activity will improve Outcome: Adequate for Discharge Goal: Will verbalize the importance of balancing activity with adequate rest periods Outcome: Adequate for Discharge   Problem: Respiratory: Goal: Ability to maintain a clear airway will improve Outcome: Adequate for Discharge Goal: Levels of oxygenation will improve Outcome: Adequate for Discharge Goal: Ability to maintain adequate ventilation will improve Outcome: Adequate for Discharge

## 2024-07-08 NOTE — Discharge Summary (Signed)
 Physician Discharge Summary   Johnathan Hernandez  male DOB: 02-Aug-1951  FMW:[id number]  PCP: Baldwin Lenis, MD  Admit date: 07/05/2024 Discharge date: 07/08/2024  Admitted From: home Disposition:  home Home Health: Pt declined  CODE STATUS: Full code  Discharge Instructions     Diet - low sodium heart healthy   Complete by: As directed    Discharge instructions   Complete by: As directed    For your COPD flare up, please finish taking 2 more days of prednisone  starting on 11/25.  You are discharged on 2 liters of supplemental oxygen.  Please follow up with your PCP for see when you can get off of supplemental oxygen.  Please resume taking Jardiance .  We have added aldactone . [facility] Course:  For full details, please see H&P, progress notes, consult notes and ancillary notes.  Briefly,  Johnathan Hernandez is a 73 y.o. male w/ PMH of CAD s/p CABG, CVA, chronic pain who presented with dyspnea.   Possible COPD exacerbation Remote hx Tobacco  --received IV solumedrol 125 mg in the ED f/b 3 days of prednisone  40 mg daily.  Received scheduled DuoNeb.  Acute hypoxemic respiratory failure --not able to wean down to RA prior to discharge, so pt was discharged home on 2L O2.   Acute on chronic CHF / HFpEF  --received IV lasix  60 then 40 x2.  Did not continue IV diuresis due to Cr increase. --Not taking Jardiance  PTA, restarted. --added aldactone  12.5 mg daily  CAD s/p CABG --cont ASA and statin  HTN --cont home regimen as below  hx VTE  --cont Eliquis   PVD/PAD w/ hx revascularization, --cont ASA and statin   Anemia, iron def --not taking iron suppl PTA, restarted.   DM2 on long acting insulin  and po meds --cont home Lantus  54 --cont home Juniva --not taking metformin  PTA, restarted.  CKD 2   GERD --On protonix , not omeprazole    Anx/Dep Continue home Effexor    Chronic pain / neuropathic pain post-CVA and post-laminectomy managed by pain clinic [facility] last  visit 10/16 started Nucynta 50 mg tid but recently switched back to hydromorphone , he is also on gabapentin , tylenol , flexeril, voltaren, lidoderm , SNRI PDMP reviewed --cont home regimen as below   Unless noted above, medications under STOP list are ones pt was not taking PTA.  Discharge Diagnoses:  Principal Problem:   (HFpEF) heart failure with preserved ejection fraction (HCC)   30 Day Unplanned Readmission Risk Score    Flowsheet Row ED to Hosp-Admission (Current) from 07/05/2024 in [facility] 1C MEDICAL TELEMETRY  30 Day Unplanned Readmission Risk Score (%) 19.2 Filed at 07/08/2024 0801    This score is the patient's risk of an unplanned readmission within 30 days of being discharged (0 -100%). The score is based on dignosis, age, lab data, medications, orders, and past utilization.   Low:  0-14.9   Medium: 15-21.9   High: 22-29.9   Extreme: 30 and above         Discharge Instructions:  Allergies as of 07/08/2024       Reactions   Percocet [oxycodone-acetaminophen ]    Vicodin [hydrocodone -acetaminophen ]    Duloxetine Hcl Anxiety   Seating, anxiety, increased heart rate        Medication List     STOP taking these medications    fish oil-omega-3 fatty acids 1000 MG capsule   fluticasone  50 MCG/ACT nasal spray Commonly known as: FLONASE   multivitamin tablet   Nucynta 50 MG tablet Generic drug: tapentadol   RA Omeprazole 20 MG Tbec Generic drug: Omeprazole       TAKE these medications    aspirin  EC 81 MG tablet Take 81 mg by mouth once.   atorvastatin  80 MG tablet Commonly known as: LIPITOR TAKE 1 TABLET BY MOUTH ONCE DAILY   cilostazol 50 MG tablet Commonly known as: PLETAL Take 50 mg by mouth 2 (two) times daily.   cyanocobalamin 1000 MCG tablet Commonly known as: VITAMIN B12 Take 1,000 mcg by mouth daily.   cyclobenzaprine 5 MG tablet Commonly known as: FLEXERIL Take 5 mg by mouth at bedtime as needed for  muscle spasms.   Dexcom G7 Receiver Devi For use with continuous glucose monitoring system.   Dexcom G7 Sensor Misc 1 Device by Other route.   diclofenac Sodium 1 % Gel Commonly known as: VOLTAREN Apply 2 g topically 4 (four) times daily as needed.   Eliquis  5 MG Tabs tablet Generic drug: apixaban  Take 5 mg by mouth 2 (two) times daily.   empagliflozin  10 MG Tabs tablet Commonly known as: JARDIANCE  Take 1 tablet (10 mg total) by mouth daily.   enalapril  5 MG tablet Commonly known as: VASOTEC  TAKE 1 TABLET BY MOUTH ONCE DAILY   ferrous sulfate 325 (65 FE) MG tablet Take 325 mg by mouth daily with breakfast. Reported on 11/23/2015   gabapentin  800 MG tablet Commonly known as: NEURONTIN  Take 800 mg by mouth 3 (three) times daily.   glipiZIDE  5 MG 24 hr tablet Commonly known as: GLUCOTROL  XL Take 5 mg by mouth daily.   Global Ease Inject Pen Needles 29G X Misc Generic drug: Insulin  Pen Needle Use for insulin  injections.   glucose monitoring kit monitoring kit 1 each by Does not apply route as needed for other. PATIENT TO CHOOSE THE GLUCOMETER OF HIS CHOICE   HYDROmorphone  8 MG tablet Commonly known as: DILAUDID  Take 1 tablet (8 mg total) by mouth 3 (three) times daily. Home med. What changed: additional instructions   Lantus  SoloStar 100 UNIT/ML Solostar Pen Generic drug: insulin  glargine Inject 54 Units into the skin at bedtime.   levocetirizine 5 MG tablet Commonly known as: XYZAL Take 1 tablet by mouth every evening.   lidocaine  5 % Commonly known as: LIDODERM  Place 1 patch onto the skin every 12 (twelve) hours.   loratadine 10 MG tablet Commonly known as: CLARITIN Take 10 mg by mouth daily.   Melatonin 5 MG Tbdp Take by mouth.   metFORMIN  1000 MG tablet Commonly known as: GLUCOPHAGE  TAKE ONE TABLET BY MOUTH TWICE DAILY WITH MEALS   pantoprazole  40 MG tablet Commonly known as: PROTONIX  Take 40 mg by mouth daily.   predniSONE  20 MG  tablet Commonly known as: DELTASONE  Take 2 tablets (40 mg total) by mouth daily with breakfast for 2 days. Start taking on: July 09, 2024   sitaGLIPtin  100 MG tablet Commonly known as: JANUVIA  Take 100 mg by mouth daily.   spironolactone  25 MG tablet Commonly known as: ALDACTONE  Take 0.5 tablets (12.5 mg total) by mouth daily.   Stool Softener 100 MG capsule Generic drug: docusate sodium  Take 100 mg by mouth once.   tamsulosin 0.4 MG Caps capsule Commonly known as: FLOMAX Take 0.4 mg by mouth daily.   TYLENOL  PO Take by mouth daily.   venlafaxine  XR 150 MG 24 hr capsule Commonly known as: EFFEXOR -XR Take 150 mg by mouth daily.   Vitamin D3  50 MCG (2000 UT) Tabs Take by mouth.               Durable Medical Equipment  (From admission, onward)           Start     Ordered   07/08/24 0841  For home use only DME oxygen  Once       Question Answer Comment  Length of Need 6 Months   Mode or (Route) Nasal cannula   Liters per Minute 2   Frequency Continuous (stationary and portable oxygen unit needed)   Oxygen delivery system: Gas      07/08/24 0841             Follow-up Information     Baldwin Lenis, MD Follow up.   Specialty: Family Medicine Why: Patient is making own follow up appt  hospital follow up Contact information: [address] [place] [address] [place] [address] [phone]         [facility]. Go on 07/23/2024.   Specialty: Cardiology Why: Hospital Follow-Up 07/23/2024 @ 9:00 AM Please bring all medications to follow-u[p appointment [facility] Free Valet Parking at the door Contact information: [address] [place]   [address] [phone]                Allergies  Allergen Reactions   Percocet [Oxycodone-Acetaminophen ]    Vicodin [Hydrocodone -Acetaminophen ]    Duloxetine Hcl Anxiety    Seating,  anxiety, increased heart rate     The results of significant diagnostics from this hospitalization (including imaging, microbiology, ancillary and laboratory) are listed below for reference.   Consultations:   Procedures/Studies: ECHOCARDIOGRAM COMPLETE Result Date: 07/05/2024    ECHOCARDIOGRAM REPORT   Patient Name:   Johnathan Hernandez Date of Exam: 07/05/2024 Medical Rec #:  [id number]    Height:       71.0 in Accession #:    [id number]   Weight:       237.4 lb Date of Birth:  1951-06-17    BSA:          2.268 m Patient Age:    73 years     BP:           145/86 mmHg Patient Gender: M            HR:           104 bpm. Exam Location:  [facility] Procedure: 2D Echo, Cardiac Doppler and Color Doppler (Both Spectral and Color            Flow Doppler were utilized during procedure). Indications:     CHF-acute diastolic I50.31  History:         Patient has no prior history of Echocardiogram examinations.                  Risk Factors:Dyslipidemia, Hypertension and Diabetes.  Sonographer:     Christopher Furnace Referring Phys:  [id number] LANETA BLUNT Diagnosing Phys: Denyse Bathe IMPRESSIONS  1. Left ventricular ejection fraction, by estimation, is 60 to 65%. The left ventricle has normal function. The left ventricle has no regional wall motion abnormalities. There is mild concentric left ventricular hypertrophy. Left ventricular diastolic parameters are consistent with Grade I diastolic dysfunction (impaired relaxation).  2. Right ventricular systolic function is normal. The right ventricular size is normal.  3. The mitral valve is normal in structure. Trivial mitral valve regurgitation. No evidence  of mitral stenosis.  4. The aortic valve is normal in structure. Aortic valve regurgitation is not visualized. No aortic stenosis is present.  5. The inferior vena cava is normal in size with greater than 50% respiratory variability, suggesting right atrial pressure of 3 mmHg. FINDINGS  Left Ventricle: Left ventricular ejection  fraction, by estimation, is 60 to 65%. The left ventricle has normal function. The left ventricle has no regional wall motion abnormalities. Strain was performed and the global longitudinal strain is indeterminate. The left ventricular internal cavity size was normal in size. There is mild concentric left ventricular hypertrophy. Left ventricular diastolic parameters are consistent with Grade I diastolic dysfunction (impaired relaxation). Right Ventricle: The right ventricular size is normal. No increase in right ventricular wall thickness. Right ventricular systolic function is normal. Left Atrium: Left atrial size was normal in size. Right Atrium: Right atrial size was normal in size. Pericardium: There is no evidence of pericardial effusion. Mitral Valve: The mitral valve is normal in structure. Trivial mitral valve regurgitation. No evidence of mitral valve stenosis. Tricuspid Valve: The tricuspid valve is normal in structure. Tricuspid valve regurgitation is trivial. No evidence of tricuspid stenosis. Aortic Valve: The aortic valve is normal in structure. Aortic valve regurgitation is not visualized. No aortic stenosis is present. Aortic valve mean gradient measures 3.0 mmHg. Aortic valve peak gradient measures 4.4 mmHg. Aortic valve area, by VTI measures 2.08 cm. Pulmonic Valve: The pulmonic valve was normal in structure. Pulmonic valve regurgitation is trivial. No evidence of pulmonic stenosis. Aorta: The aortic root is normal in size and structure. Venous: The inferior vena cava is normal in size with greater than 50% respiratory variability, suggesting right atrial pressure of 3 mmHg. IAS/Shunts: No atrial level shunt detected by color flow Doppler. Additional Comments: 3D was performed not requiring image post processing on an independent workstation and was indeterminate.  LEFT VENTRICLE PLAX 2D LVIDd:         5.10 cm   Diastology LVIDs:         2.90 cm   LV e' medial:    8.70 cm/s LV PW:         1.50 cm    LV E/e' medial:  8.9 LV IVS:        1.30 cm   LV e' lateral:   8.70 cm/s LVOT diam:     2.20 cm   LV E/e' lateral: 8.9 LV SV:         36 LV SV Index:   16 LVOT Area:     3.80 cm LV IVRT:       79 msec  RIGHT VENTRICLE RV Basal diam:  4.10 cm     PULMONARY VEINS RV Mid diam:    2.40 cm     Diastolic Velocity: 19.20 cm/s RV S prime:     10.20 cm/s  S/D Velocity:       2.50 TAPSE (M-mode): 2.6 cm      Systolic Velocity:  48.00 cm/s LEFT ATRIUM             Index        RIGHT ATRIUM           Index LA diam:        3.70 cm 1.63 cm/m   RA Area:     10.40 cm LA Vol (A2C):   42.6 ml 18.78 ml/m  RA Volume:   18.60 ml  8.20 ml/m LA Vol (A4C):   27.5 ml 12.13 ml/m  LA Biplane Vol: 34.6 ml 15.26 ml/m  AORTIC VALVE AV Area (Vmax):    3.01 cm AV Area (Vmean):   2.72 cm AV Area (VTI):     2.08 cm AV Vmax:           105.00 cm/s AV Vmean:          74.100 cm/s AV VTI:            0.173 m AV Peak Grad:      4.4 mmHg AV Mean Grad:      3.0 mmHg LVOT Vmax:         83.10 cm/s LVOT Vmean:        53.000 cm/s LVOT VTI:          0.095 m LVOT/AV VTI ratio: 0.55  AORTA Ao Root diam: 3.10 cm MITRAL VALVE                TRICUSPID VALVE MV Area (PHT): 7.51 cm     TR Peak grad:   11.8 mmHg MV Decel Time: 101 msec     TR Vmax:        172.00 cm/s MV E velocity: 77.60 cm/s MV A velocity: 101.00 cm/s  SHUNTS MV E/A ratio:  0.77         Systemic VTI:  0.09 m                             Systemic Diam: 2.20 cm Denyse Bathe Electronically signed by Denyse Bathe Signature Date/Time: 07/05/2024/4:33:33 PM    Final    DG Chest Portable 1 View Result Date: 07/05/2024 EXAM: 1 VIEW(S) XRAY OF THE CHEST 07/05/2024 08:00:00 AM COMPARISON: 06/12/2012 CLINICAL HISTORY: SOB, suspect CHF/COPD combo FINDINGS: LUNGS AND PLEURA: No focal pulmonary opacity. No pleural effusion. No pneumothorax. HEART AND MEDIASTINUM: Stable cardiomediastinal silhouette. BONES AND SOFT TISSUES: Sternotomy wires are noted. No acute osseous abnormality. IMPRESSION: 1. No acute  cardiopulmonary process. Electronically signed by: Lynwood Seip MD 07/05/2024 08:17 AM EST RP Workstation: [id number]      Labs: BNP (last 3 results) No results for input(s): BNP in the last 8760 hours. Basic Metabolic Panel: Recent Labs  Lab 07/05/24 0750 07/06/24 0552 07/07/24 0440 07/08/24 0609  NA 140 138 139 141  K 4.4 4.2 3.8 3.8  CL 105 102 102 104  CO2 26 25 27 27   GLUCOSE 282* 309* 223* 211*  BUN 14 28* 29* 28*  CREATININE 1.11 1.46* 1.29* 1.24  CALCIUM  9.4 9.5 9.4 9.3  MG 2.1  --  2.3 2.5*   Liver Function Tests: Recent Labs  Lab 07/05/24 0750  AST 20  ALT 15  ALKPHOS 38  BILITOT 0.2  PROT 7.3  ALBUMIN 4.2   No results for input(s): LIPASE, AMYLASE in the last 168 hours. No results for input(s): AMMONIA in the last 168 hours. CBC: Recent Labs  Lab 07/05/24 0750 07/06/24 0552  WBC 8.3 11.8*  NEUTROABS 5.5  --   HGB 9.6* 9.0*  HCT 32.7* 28.9*  MCV 83.6 80.5  PLT 255 249   Cardiac Enzymes: No results for input(s): CKTOTAL, CKMB, CKMBINDEX, TROPONINI in the last 168 hours. BNP: Invalid input(s): POCBNP CBG: Recent Labs  Lab 07/07/24 0847 07/07/24 1202 07/07/24 1646 07/07/24 2331 07/08/24 0754  GLUCAP 122* 94 302* 211* 179*   D-Dimer No results for input(s): DDIMER in the last 72 hours. Hgb A1c No results for input(s): HGBA1C in the last 72  hours. Lipid Profile No results for input(s): CHOL, HDL, LDLCALC, TRIG, CHOLHDL, LDLDIRECT in the last 72 hours. Thyroid function studies No results for input(s): TSH, T4TOTAL, T3FREE, THYROIDAB in the last 72 hours.  Invalid input(s): FREET3 Anemia work up Recent Labs    07/05/24 0958  FERRITIN 17*  TIBC 491*  IRON 23*   Urinalysis No results found for: COLORURINE, APPEARANCEUR, LABSPEC, PHURINE, GLUCOSEU, HGBUR, BILIRUBINUR, KETONESUR, PROTEINUR, UROBILINOGEN, NITRITE, LEUKOCYTESUR Sepsis Labs Recent Labs  Lab  07/05/24 0750 07/06/24 0552  WBC 8.3 11.8*   Microbiology Recent Results (from the past 240 hours)  Resp panel by RT-PCR (RSV, Flu A&B, Covid) Anterior Nasal Swab     Status: None   Collection Time: 07/05/24  7:50 AM   Specimen: Anterior Nasal Swab  Result Value Ref Range Status   SARS Coronavirus 2 by RT PCR NEGATIVE NEGATIVE Final    Comment: (NOTE) SARS-CoV-2 target nucleic acids are NOT DETECTED.  The SARS-CoV-2 RNA is generally detectable in upper respiratory specimens during the acute phase of infection. The lowest concentration of SARS-CoV-2 viral copies this assay can detect is 138 copies/mL. A negative result does not preclude SARS-Cov-2 infection and should not be used as the sole basis for treatment or other patient management decisions. A negative result Paschen occur with  improper specimen collection/handling, submission of specimen other than nasopharyngeal swab, presence of viral mutation(s) within the areas targeted by this assay, and inadequate number of viral copies(<138 copies/mL). A negative result must be combined with clinical observations, patient history, and epidemiological information. The expected result is Negative.  Fact Sheet for Patients:  bloggercourse.com  Fact Sheet for Healthcare Providers:  seriousbroker.it  This test is no t yet approved or cleared by the [place]  FDA and  has been authorized for detection and/or diagnosis of SARS-CoV-2 by FDA under an Emergency Use Authorization (EUA). This EUA will remain  in effect (meaning this test can be used) for the duration of the COVID-19 declaration under Section 564(b)(1) of the Act, 21 U.S.C.section 360bbb-3(b)(1), unless the authorization is terminated  or revoked sooner.       Influenza A by PCR NEGATIVE NEGATIVE Final   Influenza B by PCR NEGATIVE NEGATIVE Final    Comment: (NOTE) The Xpert Xpress SARS-CoV-2/FLU/RSV plus assay is  intended as an aid in the diagnosis of influenza from Nasopharyngeal swab specimens and should not be used as a sole basis for treatment. Nasal washings and aspirates are unacceptable for Xpert Xpress SARS-CoV-2/FLU/RSV testing.  Fact Sheet for Patients: bloggercourse.com  Fact Sheet for Healthcare Providers: seriousbroker.it  This test is not yet approved or cleared by the [place]  FDA and has been authorized for detection and/or diagnosis of SARS-CoV-2 by FDA under an Emergency Use Authorization (EUA). This EUA will remain in effect (meaning this test can be used) for the duration of the COVID-19 declaration under Section 564(b)(1) of the Act, 21 U.S.C. section 360bbb-3(b)(1), unless the authorization is terminated or revoked.     Resp Syncytial Virus by PCR NEGATIVE NEGATIVE Final    Comment: (NOTE) Fact Sheet for Patients: bloggercourse.com  Fact Sheet for Healthcare Providers: seriousbroker.it  This test is not yet approved or cleared by the [place]  FDA and has been authorized for detection and/or diagnosis of SARS-CoV-2 by FDA under an Emergency Use Authorization (EUA). This EUA will remain in effect (meaning this test can be used) for the duration of the COVID-19 declaration under Section 564(b)(1) of the Act, 21 U.S.C. section 360bbb-3(b)(1), unless  the authorization is terminated or revoked.  Performed at [facility], [address] [place], [place] [address]      Total time spend on discharging this patient, including the last patient exam, discussing the hospital stay, instructions for ongoing care as it relates to all pertinent caregivers, as well as preparing the medical discharge records, prescriptions, and/or referrals as applicable, is 45 minutes.    Ellouise Haber, MD  [facility] 07/08/2024, 9:54 AM

## 2024-07-12 DIAGNOSIS — M48061 Spinal stenosis, lumbar region without neurogenic claudication: Secondary | ICD-10-CM | POA: Diagnosis not present

## 2024-07-12 DIAGNOSIS — M4727 Other spondylosis with radiculopathy, lumbosacral region: Secondary | ICD-10-CM | POA: Diagnosis not present

## 2024-07-12 DIAGNOSIS — M5442 Lumbago with sciatica, left side: Secondary | ICD-10-CM | POA: Diagnosis not present

## 2024-07-12 DIAGNOSIS — Z9889 Other specified postprocedural states: Secondary | ICD-10-CM | POA: Diagnosis not present

## 2024-07-12 DIAGNOSIS — M47816 Spondylosis without myelopathy or radiculopathy, lumbar region: Secondary | ICD-10-CM | POA: Diagnosis not present

## 2024-07-12 DIAGNOSIS — M4726 Other spondylosis with radiculopathy, lumbar region: Secondary | ICD-10-CM | POA: Diagnosis not present

## 2024-07-12 DIAGNOSIS — G8929 Other chronic pain: Secondary | ICD-10-CM | POA: Diagnosis not present

## 2024-07-15 DIAGNOSIS — E1165 Type 2 diabetes mellitus with hyperglycemia: Secondary | ICD-10-CM | POA: Diagnosis not present

## 2024-07-15 DIAGNOSIS — Z794 Long term (current) use of insulin: Secondary | ICD-10-CM | POA: Diagnosis not present

## 2024-07-15 DIAGNOSIS — Z23 Encounter for immunization: Secondary | ICD-10-CM | POA: Diagnosis not present

## 2024-07-15 DIAGNOSIS — I5032 Chronic diastolic (congestive) heart failure: Secondary | ICD-10-CM | POA: Diagnosis not present

## 2024-07-15 DIAGNOSIS — J441 Chronic obstructive pulmonary disease with (acute) exacerbation: Secondary | ICD-10-CM | POA: Diagnosis not present

## 2024-07-22 ENCOUNTER — Telehealth: Payer: Self-pay | Admitting: Family

## 2024-07-22 NOTE — Telephone Encounter (Signed)
 Called to confirm/remind patient of [facility] appointment [facility] on 07/23/24.   Appointment:   [x] Confirmed  [] Left mess   [] No answer/No voice mail  [] VM Full/unable to leave message  [] Phone not in service  Patient reminded to bring all medications and/or complete list.  Confirmed patient has transportation. Gave directions, instructed to utilize valet parking.

## 2024-07-22 NOTE — Progress Notes (Deleted)
 Advanced [facility] Failure Clinic Note   Referring Physician: PCP: Baldwin Lenis, MD Cardiologist: None   Chief Complaint:    HPI:  Mr Penna is a 73 y/o male with a history of HFpEF, CAD s/p CABG, CVA/ TIA, chronic pain, COPD, HLD, PVD/PAD w/ hx revascularization, HTN, T2DM, CKD, VTE on eliquis , anemia, anxiety, depression.   Admitted 07/05/24 with SOB with lower extremity edema. Hgb 9.6, hyperglycemia, clear CXR but anasarca on exam, Pro-BNP >400, troponin 38, EKG no STEMI. IV diuresed along with solumedrol and nebulizer treatment. Changed to oral prednisone . Could not be weaned down to RA so discharged on 2L. Restarted jardiance  and started 12.5mg  spironolactone . Echo 07/05/24: EF 60-65%, mild LVH, G1DD, normal RV, trivial MR.  He presents today for his initial TOC HF visit with a chief complaint of     Review of Systems: [y] = yes, [ ]  = no   General: Weight gain [ ] ; Weight loss [ ] ; Anorexia [ ] ; Fatigue [ ] ; Fever [ ] ; Chills [ ] ; Weakness [ ]   Cardiac: Chest pain/pressure [ ] ; Resting SOB [ ] ; Exertional SOB [ ] ; Orthopnea [ ] ; Pedal Edema [ ] ; Palpitations [ ] ; Syncope [ ] ; Presyncope [ ] ; Paroxysmal nocturnal dyspnea[ ]   Pulmonary: Cough [ ] ; Wheezing[ ] ; Hemoptysis[ ] ; Sputum [ ] ; Snoring [ ]   GI: Vomiting[ ] ; Dysphagia[ ] ; Melena[ ] ; Hematochezia [ ] ; Heartburn[ ] ; Abdominal pain [ ] ; Constipation [ ] ; Diarrhea [ ] ; BRBPR [ ]   GU: Hematuria[ ] ; Dysuria [ ] ; Nocturia[ ]   Vascular: Pain in legs with walking [ ] ; Pain in feet with lying flat [ ] ; Non-healing sores [ ] ; Stroke [ ] ; TIA [ ] ; Slurred speech [ ] ;  Neuro: Headaches[ ] ; Vertigo[ ] ; Seizures[ ] ; Paresthesias[ ] ;Blurred vision [ ] ; Diplopia [ ] ; Vision changes [ ]   Ortho/Skin: Arthritis [ ] ; Joint pain [ ] ; Muscle pain [ ] ; Joint swelling [ ] ; Back Pain [ ] ; Rash [ ]   Psych: Depression[ ] ; Anxiety[ ]   Heme: Bleeding problems [ ] ; Clotting disorders [ ] ; Anemia [ ]   Endocrine: Diabetes [ ] ; Thyroid dysfunction[ ]    Past  Medical History:  Diagnosis Date   Coronary artery disease    Diabetes mellitus    History of tobacco abuse    quit in 2006 after 5 vessel cabg   Hyperlipidemia    Hypertension    Peripheral vascular disease Dec 2012   [facility], treated with extensive arterial bypass    Current Outpatient Medications  Medication Sig Dispense Refill   Acetaminophen  (TYLENOL  PO) Take by mouth daily.     aspirin  EC 81 MG tablet Take 81 mg by mouth once.     atorvastatin  (LIPITOR) 80 MG tablet TAKE 1 TABLET BY MOUTH ONCE DAILY 90 tablet 0   Cholecalciferol (VITAMIN D3) 2000 UNITS TABS Take by mouth.     cilostazol (PLETAL) 50 MG tablet Take 50 mg by mouth 2 (two) times daily.     Continuous Glucose Receiver (DEXCOM G7 RECEIVER) DEVI For use with continuous glucose monitoring system.     Continuous Glucose Sensor (DEXCOM G7 SENSOR) MISC 1 Device by Other route.     cyclobenzaprine (FLEXERIL) 5 MG tablet Take 5 mg by mouth at bedtime as needed for muscle spasms.     diclofenac Sodium (VOLTAREN) 1 % GEL Apply 2 g topically 4 (four) times daily as needed.     docusate sodium  (STOOL SOFTENER) 100 MG capsule Take 100 mg by mouth once.  ELIQUIS  5 MG TABS tablet Take 5 mg by mouth 2 (two) times daily.     empagliflozin  (JARDIANCE ) 10 MG TABS tablet Take 1 tablet (10 mg total) by mouth daily. 90 tablet 0   enalapril  (VASOTEC ) 5 MG tablet TAKE 1 TABLET BY MOUTH ONCE DAILY 90 tablet 0   ferrous sulfate 325 (65 FE) MG tablet Take 325 mg by mouth daily with breakfast. Reported on 11/23/2015 (Patient not taking: Reported on 07/05/2024)     gabapentin  (NEURONTIN ) 800 MG tablet Take 800 mg by mouth 3 (three) times daily.     glipiZIDE  (GLUCOTROL  XL) 5 MG 24 hr tablet Take 5 mg by mouth daily.     glucose monitoring kit (FREESTYLE) monitoring kit 1 each by Does not apply route as needed for other. PATIENT TO CHOOSE THE GLUCOMETER OF HIS CHOICE 1 each 1   HYDROmorphone  (DILAUDID ) 8 MG tablet Take 1 tablet (8 mg total) by mouth  3 (three) times daily. Home med.     Insulin  Pen Needle (GLOBAL EASE INJECT PEN NEEDLES) 29G X MISC Use for insulin  injections.     LANTUS  SOLOSTAR 100 UNIT/ML Solostar Pen Inject 54 Units into the skin at bedtime.     levocetirizine (XYZAL) 5 MG tablet Take 1 tablet by mouth every evening.     lidocaine  (LIDODERM ) 5 % Place 1 patch onto the skin every 12 (twelve) hours.     loratadine (CLARITIN) 10 MG tablet Take 10 mg by mouth daily.     Melatonin 5 MG TBDP Take by mouth.     metFORMIN  (GLUCOPHAGE ) 1000 MG tablet TAKE ONE TABLET BY MOUTH TWICE DAILY WITH MEALS (Patient not taking: Reported on 07/05/2024) 60 tablet 5   pantoprazole  (PROTONIX ) 40 MG tablet Take 40 mg by mouth daily.     sitaGLIPtin  (JANUVIA ) 100 MG tablet Take 100 mg by mouth daily.     spironolactone  (ALDACTONE ) 25 MG tablet Take 0.5 tablets (12.5 mg total) by mouth daily. 45 tablet 0   tamsulosin (FLOMAX) 0.4 MG CAPS capsule Take 0.4 mg by mouth daily.     venlafaxine  XR (EFFEXOR -XR) 150 MG 24 hr capsule Take 150 mg by mouth daily.     vitamin B-12 (CYANOCOBALAMIN) 1000 MCG tablet Take 1,000 mcg by mouth daily.     No current facility-administered medications for this visit.    Allergies  Allergen Reactions   Percocet [Oxycodone-Acetaminophen ]    Vicodin [Hydrocodone -Acetaminophen ]    Duloxetine Hcl Anxiety    Seating, anxiety, increased heart rate      Social History   Socioeconomic History   Marital status: Married    Spouse name: Not on file   Number of children: Not on file   Years of education: Not on file   Highest education level: Not on file  Occupational History   Not on file  Tobacco Use   Smoking status: Former    Current packs/day: 0.00    Types: Cigarettes    Quit date: 11/13/2004    Years since quitting: 19.7   Smokeless tobacco: Never  Substance and Sexual Activity   Alcohol use: No   Drug use: No   Sexual activity: Not on file  Other Topics Concern   Not on file  Social History  Narrative   Not on file   Social Drivers of Health   Financial Resource Strain: Medium Risk (07/08/2024)   Overall Financial Resource Strain (CARDIA)    Difficulty of Paying Living Expenses: Somewhat hard  Food Insecurity:  No Food Insecurity (07/05/2024)   Hunger Vital Sign    Worried About Running Out of Food in the Last Year: Never true    Ran Out of Food in the Last Year: Never true  Transportation Needs: No Transportation Needs (07/05/2024)   PRAPARE - Administrator, Civil Service (Medical): No    Lack of Transportation (Non-Medical): No  Physical Activity: Inactive (05/01/2023)   Received [facility]   Exercise Vital Sign    On average, how many days per week do you engage in moderate to strenuous exercise (like a brisk walk)?: 0 days    On average, how many minutes do you engage in exercise at this level?: 0 min  Stress: No Stress Concern Present (05/01/2023)   Received from [facility] of [facility]ational [facility] - Occupational Stress Questionnaire    Feeling of Stress : Not at all  Social Connections: Moderately Integrated (07/05/2024)   Social Connection and Isolation Panel    Frequency of Communication with Friends and Family: More than three times a week    Frequency of Social Gatherings with Friends and Family: More than three times a week    Attends Religious Services: 1 to 4 times per year    Active Member of [employer] or Organizations: No    Attends Banker Meetings: 1 to 4 times per year    Marital Status: Widowed  Intimate Partner Violence: Not At Risk (07/05/2024)   Humiliation, Afraid, Rape, and Kick questionnaire    Fear of Current or Ex-Partner: No    Emotionally Abused: No    Physically Abused: No    Sexually Abused: No      Family History  Problem Relation Age of Onset   Heart disease Mother    Diabetes Mother    Cancer Father        Lung CA /   Diabetes Sister    Gastric cancer Brother 103        metastatic at diagnosis       PHYSICAL EXAM: General:  Well appearing. No respiratory difficulty HEENT: normal Neck: supple. no JVD. Carotids 2+ bilat; no bruits. No lymphadenopathy or thyromegaly appreciated. Cor: PMI nondisplaced. Regular rate & rhythm. No rubs, gallops or murmurs. Lungs: clear Abdomen: soft, nontender, nondistended. No hepatosplenomegaly. No bruits or masses. Good bowel sounds. Extremities: no cyanosis, clubbing, rash, edema Neuro: alert & oriented x 3, cranial nerves grossly intact. moves all 4 extremities w/o difficulty. Affect pleasant.  ECG:   ASSESSMENT & PLAN:  1: HFpEF- - suspect  2: HTN- - BP  3: T2DM-  4: CAD -s/p CABG  5: HLD- - LDL  6: PAD/PVD-  7: VTE- - on eliquis   8: Anemia-   Ellouise DELENA Class, FNP 07/22/24

## 2024-07-23 ENCOUNTER — Telehealth: Payer: Self-pay | Admitting: Family

## 2024-07-23 ENCOUNTER — Ambulatory Visit: Payer: Medicare (Managed Care) | Admitting: Family

## 2024-07-23 NOTE — Telephone Encounter (Signed)
 Patient did not show for his initial TOC Heart Failure Clinic appointment on 07/23/24.

## 2024-08-26 ENCOUNTER — Telehealth: Payer: Self-pay | Admitting: Family

## 2024-08-26 NOTE — Progress Notes (Unsigned)
 "  Advanced Heart Failure Clinic Note   Referring Physician: PCP: Johnathan Lenis, MD Cardiologist: None   Chief Complaint:    HPI:  Johnathan Hernandez is a 74 y/o male with a history of HPpEF, CAD s/p CABG, HTN, T2DM, COPD, CVA, chronic pain, anemia, DVT, PVD/ PAD with previous revascularization, CKD, anxiety, depression.   Admitted 07/05/24 with SOB. Unable to be weaned off oxygen @ 2L. Given IV solumedrol / prednisone  for COPD exacerbation. IV diuresed. Echo 07/05/24: EF 60-65%, mild LVH, G1DD, normal RV  He presents today for his initial TOC HF visit with a chief complaint of    Review of Systems: [y] = yes, [ ]  = no   General: Weight gain [ ] ; Weight loss [ ] ; Anorexia [ ] ; Fatigue [ ] ; Fever [ ] ; Chills [ ] ; Weakness [ ]   Cardiac: Chest pain/pressure [ ] ; Resting SOB [ ] ; Exertional SOB [ ] ; Orthopnea [ ] ; Pedal Edema [ ] ; Palpitations [ ] ; Syncope [ ] ; Presyncope [ ] ; Paroxysmal nocturnal dyspnea[ ]   Pulmonary: Cough [ ] ; Wheezing[ ] ; Hemoptysis[ ] ; Sputum [ ] ; Snoring [ ]   GI: Vomiting[ ] ; Dysphagia[ ] ; Melena[ ] ; Hematochezia [ ] ; Heartburn[ ] ; Abdominal pain [ ] ; Constipation [ ] ; Diarrhea [ ] ; BRBPR [ ]   GU: Hematuria[ ] ; Dysuria [ ] ; Nocturia[ ]   Vascular: Pain in legs with walking [ ] ; Pain in feet with lying flat [ ] ; Non-healing sores [ ] ; Stroke [ ] ; TIA [ ] ; Slurred speech [ ] ;  Neuro: Headaches[ ] ; Vertigo[ ] ; Seizures[ ] ; Paresthesias[ ] ;Blurred vision [ ] ; Diplopia [ ] ; Vision changes [ ]   Ortho/Skin: Arthritis [ ] ; Joint pain [ ] ; Muscle pain [ ] ; Joint swelling [ ] ; Back Pain [ ] ; Rash [ ]   Psych: Depression[ ] ; Anxiety[ ]   Heme: Bleeding problems [ ] ; Clotting disorders [ ] ; Anemia [ ]   Endocrine: Diabetes [ ] ; Thyroid dysfunction[ ]    Past Medical History:  Diagnosis Date   Coronary artery disease    Diabetes mellitus    History of tobacco abuse    quit in 2006 after 5 vessel cabg   Hyperlipidemia    Hypertension    Peripheral vascular disease Dec 2012   [facility],  treated with extensive arterial bypass    Current Outpatient Medications  Medication Sig Dispense Refill   Acetaminophen  (TYLENOL  PO) Take by mouth daily.     aspirin  EC 81 MG tablet Take 81 mg by mouth once.     atorvastatin  (LIPITOR) 80 MG tablet TAKE 1 TABLET BY MOUTH ONCE DAILY 90 tablet 0   Cholecalciferol (VITAMIN D3) 2000 UNITS TABS Take by mouth.     cilostazol (PLETAL) 50 MG tablet Take 50 mg by mouth 2 (two) times daily.     Continuous Glucose Receiver (DEXCOM G7 RECEIVER) DEVI For use with continuous glucose monitoring system.     Continuous Glucose Sensor (DEXCOM G7 SENSOR) MISC 1 Device by Other route.     cyclobenzaprine (FLEXERIL) 5 MG tablet Take 5 mg by mouth at bedtime as needed for muscle spasms.     diclofenac Sodium (VOLTAREN) 1 % GEL Apply 2 g topically 4 (four) times daily as needed.     docusate sodium  (STOOL SOFTENER) 100 MG capsule Take 100 mg by mouth once.     ELIQUIS  5 MG TABS tablet Take 5 mg by mouth 2 (two) times daily.     empagliflozin  (JARDIANCE ) 10 MG TABS tablet Take 1 tablet (10 mg total) by mouth  daily. 90 tablet 0   enalapril  (VASOTEC ) 5 MG tablet TAKE 1 TABLET BY MOUTH ONCE DAILY 90 tablet 0   ferrous sulfate 325 (65 FE) MG tablet Take 325 mg by mouth daily with breakfast. Reported on 11/23/2015 (Patient not taking: Reported on 07/05/2024)     gabapentin  (NEURONTIN ) 800 MG tablet Take 800 mg by mouth 3 (three) times daily.     glipiZIDE  (GLUCOTROL  XL) 5 MG 24 hr tablet Take 5 mg by mouth daily.     glucose monitoring kit (FREESTYLE) monitoring kit 1 each by Does not apply route as needed for other. PATIENT TO CHOOSE THE GLUCOMETER OF HIS CHOICE 1 each 1   HYDROmorphone  (DILAUDID ) 8 MG tablet Take 1 tablet (8 mg total) by mouth 3 (three) times daily. Home med.     Insulin  Pen Needle (GLOBAL EASE INJECT PEN NEEDLES) 29G X MISC Use for insulin  injections.     LANTUS  SOLOSTAR 100 UNIT/ML Solostar Pen Inject 54 Units into the skin at bedtime.      levocetirizine (XYZAL) 5 MG tablet Take 1 tablet by mouth every evening.     lidocaine  (LIDODERM ) 5 % Place 1 patch onto the skin every 12 (twelve) hours.     loratadine (CLARITIN) 10 MG tablet Take 10 mg by mouth daily.     Melatonin 5 MG TBDP Take by mouth.     metFORMIN  (GLUCOPHAGE ) 1000 MG tablet TAKE ONE TABLET BY MOUTH TWICE DAILY WITH MEALS (Patient not taking: Reported on 07/05/2024) 60 tablet 5   pantoprazole  (PROTONIX ) 40 MG tablet Take 40 mg by mouth daily.     sitaGLIPtin  (JANUVIA ) 100 MG tablet Take 100 mg by mouth daily.     spironolactone  (ALDACTONE ) 25 MG tablet Take 0.5 tablets (12.5 mg total) by mouth daily. 45 tablet 0   tamsulosin (FLOMAX) 0.4 MG CAPS capsule Take 0.4 mg by mouth daily.     venlafaxine  XR (EFFEXOR -XR) 150 MG 24 hr capsule Take 150 mg by mouth daily.     vitamin B-12 (CYANOCOBALAMIN) 1000 MCG tablet Take 1,000 mcg by mouth daily.     No current facility-administered medications for this visit.    Allergies[1]    Social History   Socioeconomic History   Marital status: Married    Spouse name: Not on file   Number of children: Not on file   Years of education: Not on file   Highest education level: Not on file  Occupational History   Not on file  Tobacco Use   Smoking status: Former    Current packs/day: 0.00    Types: Cigarettes    Quit date: 11/13/2004    Years since quitting: 19.7   Smokeless tobacco: Never  Substance and Sexual Activity   Alcohol use: No   Drug use: No   Sexual activity: Not on file  Other Topics Concern   Not on file  Social History Narrative   Not on file   Social Drivers of Health   Tobacco Use: Medium Risk (07/15/2024)   Received from [facility]   Patient History    Smoking Tobacco Use: Former    Smokeless Tobacco Use: Never    Passive Exposure: Not on file  Financial Resource Strain: Medium Risk (07/08/2024)   Overall Financial Resource Strain (CARDIA)    Difficulty of Paying Living Expenses: Somewhat  hard  Food Insecurity: No Food Insecurity (07/05/2024)   Epic    Worried About Radiation Protection Practitioner of Food in the Last Year: Never  true    Ran Out of Food in the Last Year: Never true  Transportation Needs: No Transportation Needs (07/05/2024)   Epic    Lack of Transportation (Medical): No    Lack of Transportation (Non-Medical): No  Physical Activity: Inactive (05/01/2023)   Received from [facility]   Exercise Vital Sign    On average, how many days per week do you engage in moderate to strenuous exercise (like a brisk walk)?: 0 days    On average, how many minutes do you engage in exercise at this level?: 0 min  Stress: No Stress Concern Present (05/01/2023)   Received from [facility] of Occupational Health - Occupational Stress Questionnaire    Feeling of Stress : Not at all  Social Connections: Moderately Integrated (07/05/2024)   Social Connection and Isolation Panel    Frequency of Communication with Friends and Family: More than three times a week    Frequency of Social Gatherings with Friends and Family: More than three times a week    Attends Religious Services: 1 to 4 times per year    Active Member of [employer] or Organizations: No    Attends Banker Meetings: 1 to 4 times per year    Marital Status: Widowed  Intimate Partner Violence: Not At Risk (07/05/2024)   Epic    Fear of Current or Ex-Partner: No    Emotionally Abused: No    Physically Abused: No    Sexually Abused: No  Depression (PHQ2-9): Not on file  Alcohol Screen: Not on file  Housing: Low Risk (07/05/2024)   Epic    Unable to Pay for Housing in the Last Year: No    Number of Times Moved in the Last Year: 0    Homeless in the Last Year: No  Utilities: Not At Risk (07/05/2024)   Epic    Threatened with loss of utilities: No  Health Literacy: Low Risk (05/01/2023)   Received from [facility]    How often do you need to have someone help you when you  read instructions, pamphlets, or other written material from your doctor or pharmacy?: Never      Family History  Problem Relation Age of Onset   Heart disease Mother    Diabetes Mother    Cancer Father        Lung CA /   Diabetes Sister    Gastric cancer Brother 104       metastatic at diagnosis      PHYSICAL EXAM: General:  Well appearing. No respiratory difficulty HEENT: normal Neck: supple. no JVD. Carotids 2+ bilat; no bruits. No lymphadenopathy or thyromegaly appreciated. Cor: PMI nondisplaced. Regular rate & rhythm. No rubs, gallops or murmurs. Lungs: clear Abdomen: soft, nontender, nondistended. No hepatosplenomegaly. No bruits or masses. Good bowel sounds. Extremities: no cyanosis, clubbing, rash, edema Neuro: alert & oriented x 3, cranial nerves grossly intact. moves all 4 extremities w/o difficulty. Affect pleasant.  ECG:   ASSESSMENT & PLAN:  1: HFpEF- - suspect due to  - NYHA class - euvolemic - weighing daily - Echo 07/05/24: EF 60-65%, mild LVH, G1DD, normal RV - continue  - proBNP 07/05/24 was 454.0  2: HTN- - BP - saw PCP Taras) 12/25 - BMET 07/08/24 reviewed: K 3.8, creatinine 1.24, GFR >60  3: CAD- - s/p CABG  4: T2DM- - A1c  07/05/24 was 9.5%  5: Anemia- - Hg 07/06/24 was  9%  6: PVD/ PAD-    Ellouise DELENA Class, FNP 08/26/2024     [1]  Allergies Allergen Reactions   Percocet [Oxycodone-Acetaminophen ]    Vicodin [Hydrocodone -Acetaminophen ]    Duloxetine Hcl Anxiety    Seating, anxiety, increased heart rate   "

## 2024-08-26 NOTE — Telephone Encounter (Signed)
 Called to confirm/remind patient of their appointment at the Advanced [facility] Failure Clinic on 08/27/24.   Appointment:   [] Confirmed  [x] Left mess   [] No answer/No voice mail  [] VM Full/unable to leave message  [] Phone not in service  Patient reminded to bring all medications and/or complete list.  Confirmed patient has transportation. Gave directions, instructed to utilize valet parking.

## 2024-08-27 ENCOUNTER — Ambulatory Visit: Payer: Medicare (Managed Care) | Attending: Family | Admitting: Family

## 2024-08-27 ENCOUNTER — Encounter: Payer: Self-pay | Admitting: Family

## 2024-08-27 VITALS — BP 101/65 | HR 115 | Wt 221.5 lb

## 2024-08-27 DIAGNOSIS — R000 Tachycardia, unspecified: Secondary | ICD-10-CM | POA: Insufficient documentation

## 2024-08-27 DIAGNOSIS — I2510 Atherosclerotic heart disease of native coronary artery without angina pectoris: Secondary | ICD-10-CM | POA: Insufficient documentation

## 2024-08-27 DIAGNOSIS — I479 Paroxysmal tachycardia, unspecified: Secondary | ICD-10-CM | POA: Diagnosis not present

## 2024-08-27 DIAGNOSIS — Z8673 Personal history of transient ischemic attack (TIA), and cerebral infarction without residual deficits: Secondary | ICD-10-CM | POA: Insufficient documentation

## 2024-08-27 DIAGNOSIS — Z79899 Other long term (current) drug therapy: Secondary | ICD-10-CM | POA: Insufficient documentation

## 2024-08-27 DIAGNOSIS — I454 Nonspecific intraventricular block: Secondary | ICD-10-CM | POA: Diagnosis not present

## 2024-08-27 DIAGNOSIS — E1122 Type 2 diabetes mellitus with diabetic chronic kidney disease: Secondary | ICD-10-CM | POA: Diagnosis not present

## 2024-08-27 DIAGNOSIS — F32A Depression, unspecified: Secondary | ICD-10-CM | POA: Insufficient documentation

## 2024-08-27 DIAGNOSIS — M79641 Pain in right hand: Secondary | ICD-10-CM | POA: Insufficient documentation

## 2024-08-27 DIAGNOSIS — I452 Bifascicular block: Secondary | ICD-10-CM | POA: Insufficient documentation

## 2024-08-27 DIAGNOSIS — F419 Anxiety disorder, unspecified: Secondary | ICD-10-CM | POA: Diagnosis not present

## 2024-08-27 DIAGNOSIS — G8929 Other chronic pain: Secondary | ICD-10-CM | POA: Diagnosis not present

## 2024-08-27 DIAGNOSIS — Z951 Presence of aortocoronary bypass graft: Secondary | ICD-10-CM | POA: Insufficient documentation

## 2024-08-27 DIAGNOSIS — Z59868 Other specified financial insecurity: Secondary | ICD-10-CM | POA: Insufficient documentation

## 2024-08-27 DIAGNOSIS — I5030 Unspecified diastolic (congestive) heart failure: Secondary | ICD-10-CM | POA: Diagnosis present

## 2024-08-27 DIAGNOSIS — I5032 Chronic diastolic (congestive) heart failure: Secondary | ICD-10-CM | POA: Diagnosis not present

## 2024-08-27 DIAGNOSIS — R531 Weakness: Secondary | ICD-10-CM | POA: Insufficient documentation

## 2024-08-27 DIAGNOSIS — I739 Peripheral vascular disease, unspecified: Secondary | ICD-10-CM | POA: Insufficient documentation

## 2024-08-27 DIAGNOSIS — I10 Essential (primary) hypertension: Secondary | ICD-10-CM | POA: Diagnosis not present

## 2024-08-27 DIAGNOSIS — J441 Chronic obstructive pulmonary disease with (acute) exacerbation: Secondary | ICD-10-CM | POA: Insufficient documentation

## 2024-08-27 DIAGNOSIS — Z87891 Personal history of nicotine dependence: Secondary | ICD-10-CM | POA: Insufficient documentation

## 2024-08-27 DIAGNOSIS — R5383 Other fatigue: Secondary | ICD-10-CM | POA: Diagnosis not present

## 2024-08-27 DIAGNOSIS — D631 Anemia in chronic kidney disease: Secondary | ICD-10-CM | POA: Insufficient documentation

## 2024-08-27 DIAGNOSIS — R002 Palpitations: Secondary | ICD-10-CM | POA: Insufficient documentation

## 2024-08-27 DIAGNOSIS — N189 Chronic kidney disease, unspecified: Secondary | ICD-10-CM | POA: Insufficient documentation

## 2024-08-27 DIAGNOSIS — E1151 Type 2 diabetes mellitus with diabetic peripheral angiopathy without gangrene: Secondary | ICD-10-CM | POA: Insufficient documentation

## 2024-08-27 DIAGNOSIS — Z7901 Long term (current) use of anticoagulants: Secondary | ICD-10-CM | POA: Diagnosis not present

## 2024-08-27 DIAGNOSIS — G4719 Other hypersomnia: Secondary | ICD-10-CM | POA: Diagnosis not present

## 2024-08-27 DIAGNOSIS — Z7984 Long term (current) use of oral hypoglycemic drugs: Secondary | ICD-10-CM | POA: Insufficient documentation

## 2024-08-27 DIAGNOSIS — I130 Hypertensive heart and chronic kidney disease with heart failure and stage 1 through stage 4 chronic kidney disease, or unspecified chronic kidney disease: Secondary | ICD-10-CM | POA: Diagnosis present

## 2024-08-27 DIAGNOSIS — Z794 Long term (current) use of insulin: Secondary | ICD-10-CM | POA: Diagnosis not present

## 2024-08-27 LAB — BASIC METABOLIC PANEL WITH GFR
BUN/Creatinine Ratio: 15 (ref 10–24)
BUN: 21 mg/dL (ref 8–27)
CO2: 20 mmol/L (ref 20–29)
Calcium: 9.4 mg/dL (ref 8.6–10.2)
Chloride: 98 mmol/L (ref 96–106)
Creatinine, Ser: 1.39 mg/dL — ABNORMAL HIGH (ref 0.76–1.27)
Glucose: 409 mg/dL — ABNORMAL HIGH (ref 70–99)
Potassium: 5.1 mmol/L (ref 3.5–5.2)
Sodium: 135 mmol/L (ref 134–144)
eGFR: 53 mL/min/1.73 — ABNORMAL LOW

## 2024-08-27 MED ORDER — PENTOXIFYLLINE ER 400 MG PO TBCR: 400.0000 mg | EXTENDED_RELEASE_TABLET | Freq: Three times a day (TID) | ORAL | 5 refills | Status: AC

## 2024-08-27 NOTE — Patient Instructions (Signed)
 Begin weighing daily and call for an overnight weight gain of 3 pounds or more or a weekly weight gain of more than 5 pounds.   Medication Changes:  STOP Pletal  START Trental  400mg  (1 tab) three times daily  Lab Work:  Go downstairs to [facility] on LOWER LEV[address] to have your blood work completed.  We will only call you if the results are abnormal or if the provider would like to make medication changes.  No news is good news.   Special Instructions // Education:  Please follow up with [facility]  PLEASE BRING MEDICATION TO EVERY VISIT  Follow-Up in: Please follow up with the Advanced Heart Failure Clinic in 1 month with Ellouise Class, FNP.      Thank you for choosing [facility] Failure [facility].    At the Advanced Heart Failure Clinic, you and your health needs are our priority. We have a designated team specialized in the treatment of Heart Failure. This Care Team includes your primary Heart Failure Specialized Cardiologist (physician), Advanced Practice Providers (APPs- Physician Assistants and Nurse Practitioners), and Pharmacist who all work together to provide you with the care you need, when you need it.   You Mcwilliams see any of the following providers on your designated Care Team at your next follow up:  Dr. Toribio Fuel Dr. Ezra Shuck Dr. Ria Commander Dr. Morene Brownie Ellouise Class, FNP Jaun Bash, RPH-CPP  Please be sure to bring in all your medications bottles to every appointment.   Need to Contact Us :  If you have any questions or concerns before your next appointment please send us  a message through [place] or call our office at [phone].    TO LEAVE A MESSAGE FOR THE NURSE SELECT OPTION 2, PLEASE LEAVE A MESSAGE INCLUDING: YOUR NAME DATE OF BIRTH CALL BACK NUMBER REASON FOR CALL**this is important as we prioritize the call backs  YOU WILL RECEIVE A CALL BACK THE SAME DAY AS LONG AS YOU CALL BEFORE 4:00 PM

## 2024-08-27 NOTE — Progress Notes (Signed)
 ITAMAR home sleep study given to patient, all instructions explained, waiver signed, and CLOUDPAT registration complete.

## 2024-08-27 NOTE — Progress Notes (Signed)
 Patient Name:         DOB:       Height:     Weight:  Office Name:         Referring Provider:  Today's Date:  Date:   STOP BANG RISK ASSESSMENT S (snore) Have you been told that you snore?     YES   T (tired) Are you often tired, fatigued, or sleepy during the day?   YES  O (obstruction) Do you stop breathing, choke, or gasp during sleep? NO   P (pressure) Do you have or are you being treated for high blood pressure? YES   B (BMI) Is your body index greater than 35 kg/m? NO   A (age) Are you 74 years old or older? YES   N (neck) Do you have a neck circumference greater than 16 inches?   NO   G (gender) Are you a male? YES   TOTAL STOP/BANG "YES" ANSWERS 5                                                                       For Office Use Only              Procedure Order Form    YES to 3+ Stop Bang questions OR two clinical symptoms - patient qualifies for WatchPAT (CPT 95800)             Clinical Notes: Will consult Sleep Specialist and refer for management of therapy due to patient increased risk of Sleep Apnea. Ordering a sleep study due to the following two clinical symptoms: Excessive daytime sleepiness G47.10 History of high blood pressure R03.0     I understand that I am proceeding with a home sleep apnea test as ordered by my treating physician. I understand that untreated sleep apnea is a serious cardiovascular risk factor and it is my responsibility to perform the test and seek management for sleep apnea. I will be contacted with the results and be managed for sleep apnea by a local sleep physician. I will be receiving equipment and further instructions from [facility]. I shall promptly ship back the equipment via the included mailing label. I understand my insurance will be billed for the test and as the patient I am responsible for any insurance related out-of-pocket costs incurred. I have been provided with written instructions and can call for additional  video or telephonic instruction, with 24-hour availability of qualified personnel to answer any questions: Patient Help Desk [phone].  Patient Signature ______________________________________________________   Date______________________ Patient Telemedicine Verbal Consent

## 2024-08-28 ENCOUNTER — Ambulatory Visit: Payer: Self-pay | Admitting: Family

## 2024-08-29 NOTE — Telephone Encounter (Signed)
 Attempted to call pt. No voicemail set up

## 2024-09-06 ENCOUNTER — Telehealth (HOSPITAL_COMMUNITY): Payer: Self-pay

## 2024-09-06 ENCOUNTER — Other Ambulatory Visit: Payer: Self-pay

## 2024-09-06 NOTE — Telephone Encounter (Signed)
 No pre cert required for home sleep study.

## 2024-09-06 NOTE — Telephone Encounter (Signed)
-----   Message from Nurse Laymon SAUNDERS, RN sent at 08/27/2024  4:50 PM EST ----- Regarding: itamar precert  Test: automatic data  Insurance: [employer] medicare  CPT:  if known  Location: home  Dx: excessive daytime sleepiness  Provider: tina  Scheduled Date:  if known

## 2024-09-11 ENCOUNTER — Other Ambulatory Visit: Payer: Self-pay

## 2024-09-26 ENCOUNTER — Ambulatory Visit: Payer: Medicare (Managed Care) | Admitting: Family
